# Patient Record
Sex: Female | Born: 1960
Health system: Southern US, Community
[De-identification: ages and names within clinical notes are randomized; demographics above are authoritative.]

## PROBLEM LIST (undated history)

## (undated) DIAGNOSIS — F329 Major depressive disorder, single episode, unspecified: Secondary | ICD-10-CM

## (undated) DIAGNOSIS — D649 Anemia, unspecified: Secondary | ICD-10-CM

## (undated) DIAGNOSIS — G4730 Sleep apnea, unspecified: Secondary | ICD-10-CM

## (undated) DIAGNOSIS — Z9884 Bariatric surgery status: Secondary | ICD-10-CM

## (undated) DIAGNOSIS — D509 Iron deficiency anemia, unspecified: Secondary | ICD-10-CM

## (undated) DIAGNOSIS — M069 Rheumatoid arthritis, unspecified: Secondary | ICD-10-CM

## (undated) DIAGNOSIS — M797 Fibromyalgia: Secondary | ICD-10-CM

## (undated) DIAGNOSIS — I2699 Other pulmonary embolism without acute cor pulmonale: Secondary | ICD-10-CM

## (undated) DIAGNOSIS — Z95828 Presence of other vascular implants and grafts: Secondary | ICD-10-CM

## (undated) DIAGNOSIS — F32A Depression, unspecified: Secondary | ICD-10-CM

## (undated) DIAGNOSIS — E538 Deficiency of other specified B group vitamins: Secondary | ICD-10-CM

## (undated) DIAGNOSIS — Q249 Congenital malformation of heart, unspecified: Secondary | ICD-10-CM

## (undated) DIAGNOSIS — D126 Benign neoplasm of colon, unspecified: Secondary | ICD-10-CM

## (undated) DIAGNOSIS — E119 Type 2 diabetes mellitus without complications: Secondary | ICD-10-CM

## (undated) HISTORY — PX: HERNIA REPAIR: SHX51

## (undated) HISTORY — DX: Depression, unspecified: F32.A

## (undated) HISTORY — DX: Deficiency of other specified B group vitamins: E53.8

## (undated) HISTORY — DX: Benign neoplasm of colon, unspecified: D12.6

## (undated) HISTORY — DX: Major depressive disorder, single episode, unspecified: F32.9

## (undated) HISTORY — PX: CARPAL TUNNEL RELEASE: SHX101

## (undated) HISTORY — DX: Iron deficiency anemia, unspecified: D50.9

## (undated) HISTORY — DX: Other pulmonary embolism without acute cor pulmonale: I26.99

## (undated) HISTORY — PX: TUBAL LIGATION: SHX77

## (undated) HISTORY — DX: Bariatric surgery status: Z98.84

## (undated) HISTORY — DX: Congenital malformation of heart, unspecified: Q24.9

## (undated) HISTORY — DX: Rheumatoid arthritis, unspecified: M06.9

## (undated) HISTORY — DX: Presence of other vascular implants and grafts: Z95.828

## (undated) HISTORY — DX: Sleep apnea, unspecified: G47.30

---

## 2005-10-02 HISTORY — PX: HIATAL HERNIA REPAIR: SHX195

## 2006-10-02 HISTORY — PX: GASTRIC BYPASS: SHX52

## 2007-10-03 HISTORY — PX: CHOLECYSTECTOMY: SHX55

## 2012-07-02 HISTORY — PX: COLONOSCOPY: SHX5424

## 2013-10-02 HISTORY — PX: FOOT SURGERY: SHX648

## 2014-10-16 ENCOUNTER — Telehealth: Payer: Self-pay

## 2014-10-16 NOTE — Telephone Encounter (Signed)
PT was referred from [place] by Denny Levy, PA, for colonoscopy. Hx of polyps and anemia. LMOM for a return call.

## 2014-11-02 NOTE — Telephone Encounter (Signed)
Pt has OV with Walden Field, NP on 11/09/2014.

## 2014-11-09 ENCOUNTER — Other Ambulatory Visit: Payer: Self-pay

## 2014-11-09 ENCOUNTER — Encounter: Payer: Self-pay | Admitting: Nurse Practitioner

## 2014-11-09 ENCOUNTER — Ambulatory Visit (INDEPENDENT_AMBULATORY_CARE_PROVIDER_SITE_OTHER): Payer: BLUE CROSS/BLUE SHIELD | Admitting: Nurse Practitioner

## 2014-11-09 VITALS — BP 133/84 | HR 85 | Temp 97.0°F | Ht 66.5 in | Wt 272.8 lb

## 2014-11-09 DIAGNOSIS — R109 Unspecified abdominal pain: Secondary | ICD-10-CM | POA: Insufficient documentation

## 2014-11-09 DIAGNOSIS — R1030 Lower abdominal pain, unspecified: Secondary | ICD-10-CM

## 2014-11-09 DIAGNOSIS — Z86010 Personal history of colonic polyps: Secondary | ICD-10-CM

## 2014-11-09 DIAGNOSIS — R197 Diarrhea, unspecified: Secondary | ICD-10-CM

## 2014-11-09 LAB — CBC WITH DIFFERENTIAL/PLATELET
BASOS PCT: 1 % (ref 0–1)
Basophils Absolute: 0.1 10*3/uL (ref 0.0–0.1)
Eosinophils Absolute: 0.2 10*3/uL (ref 0.0–0.7)
Eosinophils Relative: 3 % (ref 0–5)
HEMATOCRIT: 41.9 % (ref 36.0–46.0)
HEMOGLOBIN: 13.2 g/dL (ref 12.0–15.0)
LYMPHS PCT: 26 % (ref 12–46)
Lymphs Abs: 2.2 10*3/uL (ref 0.7–4.0)
MCH: 24.8 pg — AB (ref 26.0–34.0)
MCHC: 31.5 g/dL (ref 30.0–36.0)
MCV: 78.6 fL (ref 78.0–100.0)
MONO ABS: 0.7 10*3/uL (ref 0.1–1.0)
MONOS PCT: 9 % (ref 3–12)
NEUTROS ABS: 5.1 10*3/uL (ref 1.7–7.7)
NEUTROS PCT: 61 % (ref 43–77)
Platelets: 241 10*3/uL (ref 150–400)
RBC: 5.33 MIL/uL — AB (ref 3.87–5.11)
RDW: 24.1 % — AB (ref 11.5–15.5)
WBC: 8.3 10*3/uL (ref 4.0–10.5)

## 2014-11-09 LAB — COMPREHENSIVE METABOLIC PANEL
ALK PHOS: 79 U/L (ref 39–117)
ALT: 26 U/L (ref 0–35)
AST: 22 U/L (ref 0–37)
Albumin: 4.1 g/dL (ref 3.5–5.2)
BILIRUBIN TOTAL: 0.7 mg/dL (ref 0.2–1.2)
BUN: 14 mg/dL (ref 6–23)
CHLORIDE: 105 meq/L (ref 96–112)
CO2: 25 meq/L (ref 19–32)
Calcium: 8.3 mg/dL — ABNORMAL LOW (ref 8.4–10.5)
Creat: 0.59 mg/dL (ref 0.50–1.10)
GLUCOSE: 87 mg/dL (ref 70–99)
Potassium: 4.4 mEq/L (ref 3.5–5.3)
Sodium: 138 mEq/L (ref 135–145)
TOTAL PROTEIN: 6.8 g/dL (ref 6.0–8.3)

## 2014-11-09 MED ORDER — PEG-KCL-NACL-NASULF-NA ASC-C 100 G PO SOLR: 1.0000 | Freq: Once | ORAL | Status: AC

## 2014-11-09 NOTE — Patient Instructions (Signed)
1. We are giving you samples of Viberzi to try. Take twice a day for 2 weeks. Call and let us know how this works for your symptoms. 2. We will schedule your procedure (colonosocopy with deep sedation in the OR) with Dr. Gala Romney 3. Have your labs drawn within the next few days. We will call with results.

## 2014-11-09 NOTE — Progress Notes (Signed)
Primary Care Physician:  Denny Levy, [place] Primary Gastroenterologist:  Dr. Gala Romney   Chief Complaint  Patient presents with  . Colonoscopy    HPI:   54 year old female presents on referral from PCP for surveilance colonoscopy. Has a history of frequent polyps. Just relocated to the area less than a year ago. Last TCS in [place] 07/18/12 which found 5 mm sessile transverse colon polyp (tubular adenoma), 6 mm sessile transverse polyp (tubular adenoma), and biopsies r/o microscopic colitis (normal colonic mucosa without evidence of colitis). Prep was fair, recommended repeat in 1 year. Has a history of anemia which she sees heme/onc for and receives iron infusions.  Denies abdominal pain. Has fecal urgency within 30 minutes of eating. Has tried dietary modifications without relief. Has a bowel movement at least daily. Her bowel movements tend to be loose, typically 5-6 on the bristol stool scale. Has not had serology to evaluate for gluten intolerance. Has some abdominal cramping associated which is relieved with a bowel movement. Denies hematochezia and melena. Denies dysphagia. Denies GERD symptoms. She has some bloating if eating too quickly. Takes Naproxen as needed, no other NSAID use or ASA powder use.   Past Medical History  Diagnosis Date  . Presence of IVC filter     Past Surgical History  Procedure Laterality Date  . Hiatal hernia repair  2007  . Cholecystectomy  2009  . Gastric bypass  2008  . Foot surgery  2015    left  . Colonoscopy  07/2012    transverse colon 5 mm polyp removed    Current Outpatient Prescriptions  Medication Sig Dispense Refill  . naproxen (NAPROSYN) 500 MG tablet Take 500 mg by mouth as needed.     No current facility-administered medications for this visit.    Allergies as of 11/09/2014  . (No Known Allergies)    No family history on file.  History   Social History  . Marital Status: Married    Spouse Name: N/A    Number of Children: N/A   . Years of Education: N/A   Occupational History  . Not on file.   Social History Main Topics  . Smoking status: Never Smoker   . Smokeless tobacco: Not on file  . Alcohol Use: No  . Drug Use: No  . Sexual Activity: Not on file   Other Topics Concern  . Not on file   Social History Narrative  . No narrative on file    Review of Systems: Gen: Denies any fever, chills, weight loss, lack of appetite. Admits fatigue consistent with anemia history. CV: Denies chest pain, heart palpitations, peripheral edema, syncope.  Resp: Denies shortness of breath at rest or with exertion. Denies wheezing. Denies difficulty breathing when laying flat. GI: See HPI. Denies dysphagia or odynophagia. GU : Denies urinary burning, urinary frequency, urinary hesitancy MS: Denies joint pain, muscle weakness, cramps, or limitation of movement.  Derm: Denies rash, itching, dry skin Psych: Denies depression, anxiety, memory loss, and confusion Heme: Denies bruising, bleeding, and enlarged lymph nodes.  Physical Exam: BP 133/84 mmHg  Pulse 85  Temp(Src) 97 F (36.1 C) (Oral)  Ht 5' 6.5" (1.689 m)  Wt 272 lb 12.8 oz (123.741 kg)  BMI 43.38 kg/m2 General:   Alert and oriented. Pleasant and cooperative. Well-nourished and well-developed.  Head:  Normocephalic and atraumatic. Eyes:  Without icterus, sclera clear and conjunctiva pink.  Ears:  Normal auditory acuity. Mouth:  No deformity or lesions, oral mucosa  pink. No OP edema. Neck:  Supple, without mass or thyromegaly. Lungs:  Clear to auscultation bilaterally. No wheezes, rales, or rhonchi. No distress.  Heart:  S1, S2 present without murmurs appreciated.  Abdomen:  +BS, soft, non-distended. Mild discomfort to lower abdominal palpation otherwise non-tender. No HSM noted. No guarding or rebound. No masses appreciated.  Rectal:  Deferred  Msk:  Symmetrical without gross deformities. Normal posture. Pulses:  Normal pulses noted. Extremities:   Without clubbing or edema. Neurologic:  Alert and  oriented x4;  grossly normal neurologically. Skin:  Intact without significant lesions or rashes. Cervical Nodes:  No significant cervical adenopathy. Psych:  Alert and cooperative. Normal mood and affect.     11/09/2014 10:16 AM

## 2014-11-10 LAB — TISSUE TRANSGLUTAMINASE, IGA: TISSUE TRANSGLUTAMINASE AB, IGA: 1 U/mL (ref <4)

## 2014-11-10 NOTE — Assessment & Plan Note (Addendum)
Likely IBS-D, dietary modifications ineffective. Less likely gluten intolerance due to no weight loss or other signs of malnutrition. Will check serology to be sure. Is overdue for surveillance colonoscopy based on last recommendation in [place]. Records received and flagged for scanning. Will set her up for a colonoscopy. Will trial Viberzi bid for symptom relief, if ineffective can consider other options/medications. S/P cholecystectomy. Colonoscopy needs to be in the OR with propofol due to history of difficult sedation.  Proceed with TCS in the OR with propofol with Dr. Gala Romney in near future: the risks, benefits, and alternatives have been discussed with the patient in detail. The patient states understanding and desires to proceed.

## 2014-11-10 NOTE — Assessment & Plan Note (Addendum)
Per notes from Jody Stokes (flagged for scanning) has a history of frequent polyps. Last colonoscopy 07/18/12 with fair prep and two tubular adenomatous polyps removed and recommended repeat procedure in 1 year. Is now overdue and will set her up for surveilance TCS in the OR with propofol due to history of difficult sedation. No red flag/warning signs at this time. Having some abdominal cramping which is relieved by frequent soft to loose stools See Abdominal pain A&P for more details.  Proceed with TCS in the OR with propofol with Dr. Gala Romney in near future: the risks, benefits, and alternatives have been discussed with the patient in detail. The patient states understanding and desires to proceed.

## 2014-11-11 ENCOUNTER — Telehealth: Payer: Self-pay | Admitting: Internal Medicine

## 2014-11-11 NOTE — Telephone Encounter (Signed)
Spoke with the pt- she has been taking the viberzi since Monday. She took one Monday night and Tuesday morning and didn't have any problems, she took one Tuesday night and noticed some upper abd cramping that last a few minutes and then went away. She took another one this morning and had severe cramping that lasted about 15 minutes. The pain was just starting to subside when I spoke with her. She said the medication seems to be helping her bm's. She still has to go to the bathroom after eating but it is not urgently. She reports no fever, no nausea, no blood in her stool. Pt is concerned about the cramping and is not sure if she should take any more of the viberzi. Please advise.

## 2014-11-11 NOTE — Telephone Encounter (Signed)
Pt's husband called to say that patient was seen on Monday and she is in a lot of pain (crying). Husband wants to speak with provider. I told him that I had to send a phone note to the nurse and someone would be calling them back with recommendations. [phone]

## 2014-11-17 ENCOUNTER — Encounter (HOSPITAL_COMMUNITY): Payer: Self-pay

## 2014-11-17 ENCOUNTER — Encounter (HOSPITAL_COMMUNITY)
Admission: RE | Admit: 2014-11-17 | Discharge: 2014-11-17 | Disposition: A | Discharge status: Home / Self Care | Payer: BLUE CROSS/BLUE SHIELD | Source: Ambulatory Visit | Attending: Internal Medicine | Admitting: Internal Medicine

## 2014-11-17 HISTORY — DX: Anemia, unspecified: D64.9

## 2014-11-17 NOTE — Telephone Encounter (Signed)
Ask her if she's continuing to take the Viberzi and if she's still having cramping. If she is advise her to stop the Viberzi and see if that helps her cramping symptoms. Abdominal pain is a known side effect with 6-7% of patients. If this is the cause we can try another approach. Make sure she's scheduled for a follow-up so we can re-evaluate. If symptoms become very severe, she should be seen by someone.

## 2014-11-17 NOTE — Pre-Procedure Instructions (Signed)
Patient given information to sign up for my chart at home. 

## 2014-11-18 NOTE — Telephone Encounter (Signed)
Spoke with the pt. She is not taking the viberzi anymore. She said it seemed to help but couldn't stand the pain it was causing. She is still having diarrhea. She is scheduled for her procedure tomorrow and will wait to see what her results are from that.   Stacey, please schedule follow up visit.

## 2014-11-18 NOTE — Telephone Encounter (Signed)
Noted  

## 2014-11-18 NOTE — Telephone Encounter (Signed)
APPOINTMENT MADE AND PATIENT AWARE OF DATE AND TIME  °

## 2014-11-19 ENCOUNTER — Encounter (HOSPITAL_COMMUNITY): Payer: Self-pay | Admitting: *Deleted

## 2014-11-19 ENCOUNTER — Encounter (HOSPITAL_COMMUNITY)
Admission: RE | Disposition: A | Discharge status: Home / Self Care | Payer: Self-pay | Source: Ambulatory Visit | Attending: Internal Medicine

## 2014-11-19 ENCOUNTER — Ambulatory Visit (HOSPITAL_COMMUNITY): Payer: BLUE CROSS/BLUE SHIELD | Admitting: Anesthesiology

## 2014-11-19 ENCOUNTER — Ambulatory Visit (HOSPITAL_COMMUNITY)
Admission: RE | Admit: 2014-11-19 | Discharge: 2014-11-19 | Disposition: A | Discharge status: Home / Self Care | Payer: BLUE CROSS/BLUE SHIELD | Source: Ambulatory Visit | Attending: Internal Medicine | Admitting: Internal Medicine

## 2014-11-19 DIAGNOSIS — R197 Diarrhea, unspecified: Secondary | ICD-10-CM

## 2014-11-19 DIAGNOSIS — Q438 Other specified congenital malformations of intestine: Secondary | ICD-10-CM | POA: Diagnosis not present

## 2014-11-19 DIAGNOSIS — D123 Benign neoplasm of transverse colon: Secondary | ICD-10-CM | POA: Diagnosis not present

## 2014-11-19 DIAGNOSIS — Z860100 Personal history of colon polyps, unspecified: Secondary | ICD-10-CM | POA: Insufficient documentation

## 2014-11-19 DIAGNOSIS — D120 Benign neoplasm of cecum: Secondary | ICD-10-CM | POA: Diagnosis not present

## 2014-11-19 DIAGNOSIS — Z9884 Bariatric surgery status: Secondary | ICD-10-CM | POA: Diagnosis not present

## 2014-11-19 DIAGNOSIS — Z1211 Encounter for screening for malignant neoplasm of colon: Secondary | ICD-10-CM | POA: Diagnosis present

## 2014-11-19 DIAGNOSIS — Z9049 Acquired absence of other specified parts of digestive tract: Secondary | ICD-10-CM | POA: Insufficient documentation

## 2014-11-19 DIAGNOSIS — K5730 Diverticulosis of large intestine without perforation or abscess without bleeding: Secondary | ICD-10-CM | POA: Diagnosis not present

## 2014-11-19 DIAGNOSIS — D124 Benign neoplasm of descending colon: Secondary | ICD-10-CM | POA: Insufficient documentation

## 2014-11-19 HISTORY — PX: POLYPECTOMY: SHX5525

## 2014-11-19 HISTORY — PX: COLONOSCOPY WITH PROPOFOL: SHX5780

## 2014-11-19 HISTORY — PX: BIOPSY: SHX5522

## 2014-11-19 SURGERY — COLONOSCOPY WITH PROPOFOL
Anesthesia: Monitor Anesthesia Care

## 2014-11-19 MED ORDER — GLYCOPYRROLATE 0.2 MG/ML IJ SOLN
INTRAMUSCULAR | Status: AC
  Filled 2014-11-19: qty 1

## 2014-11-19 MED ORDER — FENTANYL CITRATE 0.05 MG/ML IJ SOLN
INTRAMUSCULAR | Status: AC
  Filled 2014-11-19: qty 2

## 2014-11-19 MED ORDER — ONDANSETRON HCL 4 MG/2ML IJ SOLN
4.0000 mg | Freq: Once | INTRAMUSCULAR | Status: AC
  Administered 2014-11-19: 4 mg via INTRAVENOUS

## 2014-11-19 MED ORDER — MIDAZOLAM HCL 2 MG/2ML IJ SOLN
1.0000 mg | INTRAMUSCULAR | Status: DC | PRN
  Administered 2014-11-19: 2 mg via INTRAVENOUS

## 2014-11-19 MED ORDER — LIDOCAINE HCL (CARDIAC) 10 MG/ML IV SOLN
INTRAVENOUS | Status: DC | PRN
  Administered 2014-11-19: 40 mg via INTRAVENOUS

## 2014-11-19 MED ORDER — ONDANSETRON HCL 4 MG/2ML IJ SOLN: 4.0000 mg | Freq: Once | INTRAMUSCULAR | Status: DC | PRN

## 2014-11-19 MED ORDER — GLYCOPYRROLATE 0.2 MG/ML IJ SOLN
0.2000 mg | Freq: Once | INTRAMUSCULAR | Status: AC
  Administered 2014-11-19: 0.2 mg via INTRAVENOUS

## 2014-11-19 MED ORDER — LIDOCAINE HCL (PF) 1 % IJ SOLN
INTRAMUSCULAR | Status: AC
  Filled 2014-11-19: qty 5

## 2014-11-19 MED ORDER — MIDAZOLAM HCL 2 MG/2ML IJ SOLN
INTRAMUSCULAR | Status: AC
  Filled 2014-11-19: qty 2

## 2014-11-19 MED ORDER — STERILE WATER FOR IRRIGATION IR SOLN
Status: DC | PRN
  Administered 2014-11-19: 1000 mL

## 2014-11-19 MED ORDER — PROPOFOL 10 MG/ML IV BOLUS
INTRAVENOUS | Status: AC
  Filled 2014-11-19: qty 20

## 2014-11-19 MED ORDER — PROPOFOL 10 MG/ML IV BOLUS
INTRAVENOUS | Status: DC | PRN
  Administered 2014-11-19: 12.4 mg via INTRAVENOUS

## 2014-11-19 MED ORDER — PROPOFOL INFUSION 10 MG/ML OPTIME
INTRAVENOUS | Status: DC | PRN
  Administered 2014-11-19: 200 ug/kg/min via INTRAVENOUS
  Administered 2014-11-19: 12:00:00 via INTRAVENOUS

## 2014-11-19 MED ORDER — FENTANYL CITRATE 0.05 MG/ML IJ SOLN: 25.0000 ug | INTRAMUSCULAR | Status: DC | PRN

## 2014-11-19 MED ORDER — LACTATED RINGERS IV SOLN
INTRAVENOUS | Status: DC
  Administered 2014-11-19: 11:00:00 via INTRAVENOUS

## 2014-11-19 MED ORDER — FENTANYL CITRATE 0.05 MG/ML IJ SOLN
25.0000 ug | INTRAMUSCULAR | Status: AC
  Administered 2014-11-19 (×2): 25 ug via INTRAVENOUS

## 2014-11-19 MED ORDER — ONDANSETRON HCL 4 MG/2ML IJ SOLN
INTRAMUSCULAR | Status: AC
  Filled 2014-11-19: qty 2

## 2014-11-19 SURGICAL SUPPLY — 23 items
ELECT REM PT RETURN 9FT ADLT (ELECTROSURGICAL)
ELECTRODE REM PT RTRN 9FT ADLT (ELECTROSURGICAL) IMPLANT
FCP BXJMBJMB 240X2.8X (CUTTING FORCEPS)
FLOOR PAD 36X40 (MISCELLANEOUS) ×2
FORCEPS BIOP RAD 4 LRG CAP 4 (CUTTING FORCEPS) ×2 IMPLANT
FORCEPS BIOP RJ4 240 W/NDL (CUTTING FORCEPS)
FORCEPS BXJMBJMB 240X2.8X (CUTTING FORCEPS) IMPLANT
FORMALIN 10 PREFIL 20ML (MISCELLANEOUS) ×10 IMPLANT
INJECTOR/SNARE I SNARE (MISCELLANEOUS) IMPLANT
KIT CLEAN ENDO COMPLIANCE (KITS) ×2 IMPLANT
LUBRICANT JELLY 4.5OZ STERILE (MISCELLANEOUS) ×2 IMPLANT
MANIFOLD NEPTUNE II (INSTRUMENTS) ×2 IMPLANT
NEEDLE SCLEROTHERAPY 25GX240 (NEEDLE) IMPLANT
PAD FLOOR 36X40 (MISCELLANEOUS) ×1 IMPLANT
PROBE APC STR FIRE (PROBE) IMPLANT
PROBE INJECTION GOLD (MISCELLANEOUS)
PROBE INJECTION GOLD 7FR (MISCELLANEOUS) IMPLANT
SNARE ROTATE MED OVAL 20MM (MISCELLANEOUS) ×2 IMPLANT
SNARE SHORT THROW 13M SML OVAL (MISCELLANEOUS) IMPLANT
SYR 50ML LL SCALE MARK (SYRINGE) ×2 IMPLANT
TRAP SPECIMEN MUCOUS 40CC (MISCELLANEOUS) ×2 IMPLANT
TUBING IRRIGATION ENDOGATOR (MISCELLANEOUS) ×2 IMPLANT
WATER STERILE IRR 1000ML POUR (IV SOLUTION) ×2 IMPLANT

## 2014-11-19 NOTE — Anesthesia Postprocedure Evaluation (Signed)
  Anesthesia Post-op Note  Patient: Jody Stokes  Procedure(s) Performed: Procedure(s) with comments: COLONOSCOPY WITH PROPOFOL (N/A) - Cecum time in   1157    time out   1216  total time 19 minutes POLYPECTOMY (N/A) - ileocecal valve, Hepatic Flexure, Splenic Flexure, Descending Colon  BIOPSY (N/A) - Ascending Colon  Patient Location: PACU  Anesthesia Type:MAC  Level of Consciousness: awake, alert , oriented and patient cooperative  Airway and Oxygen Therapy: Patient Spontanous Breathing and Patient connected to nasal cannula oxygen  Post-op Pain: none  Post-op Assessment: Post-op Vital signs reviewed, Patient's Cardiovascular Status Stable, Respiratory Function Stable, Patent Airway, No signs of Nausea or vomiting and Pain level controlled  Post-op Vital Signs: Reviewed and stable  Last Vitals:  Filed Vitals:   11/19/14 1130  BP: 149/92  Pulse: 63  Temp:   Resp: 13    Complications: No apparent anesthesia complications

## 2014-11-19 NOTE — Op Note (Signed)
[facility] [address] [place], [address]   COLONOSCOPY PROCEDURE REPORT  PATIENT: Jody Stokes, Jody Stokes  MR#: [id number] BIRTHDATE: Nov 11, 1960 , 53  yrs. old GENDER: female ENDOSCOPIST: R.  Garfield Cornea, MD FACP [facility] REFERRED AE:SLPNP Quillian Quince, M.D. PROCEDURE DATE:  11/30/14 PROCEDURE:   Ileo-colonoscopy with biopsy and Colonoscopy with snare polypectomy INDICATIONS:History of colonic adenoma; chronic diarrhea. MEDICATIONS: deep sedation per Dr.  [facility] CLASS:       Class II  CONSENT: The risks, benefits, alternatives and imponderables including but not limited to bleeding, perforation as well as the possibility of a missed lesion have been reviewed.  The potential for biopsy, lesion removal, etc. have also been discussed. Questions have been answered.  All parties agreeable.  Please see the history and physical in the medical record for more information.  DESCRIPTION OF PROCEDURE:   After the risks benefits and alternatives of the procedure were thoroughly explained, informed consent was obtained.  The digital rectal exam revealed no abnormalities of the rectum.   The     endoscope was introduced through the anus and advanced to the terminal ileum which was intubated for a short distance. No adverse events experienced. The quality of the prep was inadequate  The instrument was then slowly withdrawn as the colon was fully examined.      COLON FINDINGS: Normal rectum.  Somewhat elongated, redundant colon. Few scattered pancolonic diverticula.  Multiple colonic polyps?"(1) 5 mm polyp at the ileocecal valve, (1) 5 mm polyp at hepatic flexure, (1) 5 mm polyp at the splenic flexure and (1) 5 mm polyp in the mid descending segment.  Otherwise, the remainder of the colonic mucosa appeared normal. The distal 5 cm of terminal ileum mucosa appeared normal.  The above-mentioned polyps were all cold snare removed.  Also, biopsies of the colonic  mucosa taken to assess for microscopic colitis.  Retroflexion was performed. .  Withdrawal time=18 minutes 0 seconds.  The scope was withdrawn and the procedure completed. COMPLICATIONS: There were no immediate complications.  ENDOSCOPIC IMPRESSION: Multiple colonic polyps?"removed as described above. Pancolonic diverticulosis (few).  Status post mucosal biopsy to assess for microscopic colitis  RECOMMENDATIONS: Follow up on pathology. Further recommendations to follow.  eSigned:  R. Garfield Cornea, MD Rosalita Chessman [facility] 11/30/2014 12:55 PM   cc:  CPT CODES: ICD CODES:  The ICD and CPT codes recommended by this software are interpretations from the data that the clinical staff has captured with the software.  The verification of the translation of this report to the ICD and CPT codes and modifiers is the sole responsibility of the health care institution and practicing physician where this report was generated.  [employer] will not be held responsible for the validity of the ICD and CPT codes included on this report.  AMA assumes no liability for data contained or not contained herein. CPT is a Designer, television/film set of the [employer].  PATIENT NAME:  Jody Stokes, Jody Stokes MR#: [id number]

## 2014-11-19 NOTE — Anesthesia Preprocedure Evaluation (Signed)
Anesthesia Evaluation  Patient identified by MRN, date of birth, ID band Patient awake    Reviewed: Allergy & Precautions, NPO status , Patient's Chart, lab work & pertinent test results  Airway Mallampati: II  TM Distance: >3 FB     Dental  (+) Teeth Intact   Pulmonary neg pulmonary ROS,  breath sounds clear to auscultation        Cardiovascular DVT (s/p IVC filter) Rhythm:Regular Rate:Normal     Neuro/Psych    GI/Hepatic   Endo/Other    Renal/GU      Musculoskeletal   Abdominal   Peds  Hematology   Anesthesia Other Findings   Reproductive/Obstetrics                             Anesthesia Physical Anesthesia Plan  ASA: III  Anesthesia Plan: MAC   Post-op Pain Management:    Induction: Intravenous  Airway Management Planned: Simple Face Mask  Additional Equipment:   Intra-op Plan:   Post-operative Plan:   Informed Consent: I have reviewed the patients History and Physical, chart, labs and discussed the procedure including the risks, benefits and alternatives for the proposed anesthesia with the patient or authorized representative who has indicated his/her understanding and acceptance.     Plan Discussed with:   Anesthesia Plan Comments:         Anesthesia Quick Evaluation

## 2014-11-19 NOTE — Discharge Instructions (Signed)
°Colonoscopy °Discharge Instructions ° °Read the instructions outlined below and refer to this sheet in the next few weeks. These discharge instructions provide you with general information on caring for yourself after you leave the hospital. Your doctor may also give you specific instructions. While your treatment has been planned according to the most current medical practices available, unavoidable complications occasionally occur. If you have any problems or questions after discharge, call Dr. Rourk at [phone]. °ACTIVITY °· You may resume your regular activity, but move at a slower pace for the next 24 hours.  °· Take frequent rest periods for the next 24 hours.  °· Walking will help get rid of the air and reduce the bloated feeling in your belly (abdomen).  °· No driving for 24 hours (because of the medicine (anesthesia) used during the test).   °· Do not sign any important legal documents or operate any machinery for 24 hours (because of the anesthesia used during the test).  °NUTRITION °· Drink plenty of fluids.  °· You may resume your normal diet as instructed by your doctor.  °· Begin with a light meal and progress to your normal diet. Heavy or fried foods are harder to digest and may make you feel sick to your stomach (nauseated).  °· Avoid alcoholic beverages for 24 hours or as instructed.  °MEDICATIONS °· You may resume your normal medications unless your doctor tells you otherwise.  °WHAT YOU CAN EXPECT TODAY °· Some feelings of bloating in the abdomen.  °· Passage of more gas than usual.  °· Spotting of blood in your stool or on the toilet paper.  °IF YOU HAD POLYPS REMOVED DURING THE COLONOSCOPY: °· No aspirin products for 7 days or as instructed.  °· No alcohol for 7 days or as instructed.  °· Eat a soft diet for the next 24 hours.  °FINDING OUT THE RESULTS OF YOUR TEST °Not all test results are available during your visit. If your test results are not back during the visit, make an appointment  with your caregiver to find out the results. Do not assume everything is normal if you have not heard from your caregiver or the medical facility. It is important for you to follow up on all of your test results.  °SEEK IMMEDIATE MEDICAL ATTENTION IF: °· You have more than a spotting of blood in your stool.  °· Your belly is swollen (abdominal distention).  °· You are nauseated or vomiting.  °· You have a temperature over 101.  °· You have abdominal pain or discomfort that is severe or gets worse throughout the day.  ° ° °Polyp and diverticulosis information provided ° °Further recommendations to follow pending review of pathology report ° °Diverticulosis °Diverticulosis is the condition that develops when small pouches (diverticula) form in the wall of your colon. Your colon, or large intestine, is where water is absorbed and stool is formed. The pouches form when the inside layer of your colon pushes through weak spots in the outer layers of your colon. °CAUSES  °No one knows exactly what causes diverticulosis. °RISK FACTORS °· Being older than 50. Your risk for this condition increases with age. Diverticulosis is rare in people younger than 40 years. By age 80, almost everyone has it. °· Eating a low-fiber diet. °· Being frequently constipated. °· Being overweight. °· Not getting enough exercise. °· Smoking. °· Taking over-the-counter pain medicines, like aspirin and ibuprofen. °SYMPTOMS  °Most people with diverticulosis do not have symptoms. °DIAGNOSIS  °Because   diverticulosis often has no symptoms, health care providers often discover the condition during an exam for other colon problems. In many cases, a health care provider will diagnose diverticulosis while using a flexible scope to examine the colon (colonoscopy). °TREATMENT  °If you have never developed an infection related to diverticulosis, you may not need treatment. If you have had an infection before, treatment may include: °· Eating more fruits,  vegetables, and grains. °· Taking a fiber supplement. °· Taking a live bacteria supplement (probiotic). °· Taking medicine to relax your colon. °HOME CARE INSTRUCTIONS  °· Drink at least 6-8 glasses of water each day to prevent constipation. °· Try not to strain when you have a bowel movement. °· Keep all follow-up appointments. °If you have had an infection before:  °· Increase the fiber in your diet as directed by your health care provider or dietitian. °· Take a dietary fiber supplement if your health care provider approves. °· Only take medicines as directed by your health care provider. °SEEK MEDICAL CARE IF:  °· You have abdominal pain. °· You have bloating. °· You have cramps. °· You have not gone to the bathroom in 3 days. °SEEK IMMEDIATE MEDICAL CARE IF:  °· Your pain gets worse. °· Your bloating becomes very bad. °· You have a fever or chills, and your symptoms suddenly get worse. °· You begin vomiting. °· You have bowel movements that are bloody or black. °MAKE SURE YOU: °· Understand these instructions. °· Will watch your condition. °· Will get help right away if you are not doing well or get worse. °Document Released: 06/15/2004 Document Revised: 09/23/2013 Document Reviewed: 08/13/2013 °ExitCare® Patient Information ©2015 [employer]. This information is not intended to replace advice given to you by your health care provider. Make sure you discuss any questions you have with your health care provider. ° ° °Colon Polyps °Polyps are lumps of extra tissue growing inside the body. Polyps can grow in the large intestine (colon). Most colon polyps are noncancerous (benign). However, some colon polyps can become cancerous over time. Polyps that are larger than a pea may be harmful. To be safe, caregivers remove and test all polyps. °CAUSES  °Polyps form when mutations in the genes cause your cells to grow and divide even though no more tissue is needed. °RISK FACTORS °There are a number of risk factors  that can increase your chances of getting colon polyps. They include: °· Being older than 50 years. °· Family history of colon polyps or colon cancer. °· Long-term colon diseases, such as colitis or Crohn disease. °· Being overweight. °· Smoking. °· Being inactive. °· Drinking too much alcohol. °SYMPTOMS  °Most small polyps do not cause symptoms. If symptoms are present, they may include: °· Blood in the stool. The stool may look dark red or black. °· Constipation or diarrhea that lasts longer than 1 week. °DIAGNOSIS °People often do not know they have polyps until their caregiver finds them during a regular checkup. Your caregiver can use 4 tests to check for polyps: °· Digital rectal exam. The caregiver wears gloves and feels inside the rectum. This test would find polyps only in the rectum. °· Barium enema. The caregiver puts a liquid called barium into your rectum before taking X-rays of your colon. Barium makes your colon look white. Polyps are dark, so they are easy to see in the X-ray pictures. °· Sigmoidoscopy. A thin, flexible tube (sigmoidoscope) is placed into your rectum. The sigmoidoscope has a light and tiny camera   in it. The caregiver uses the sigmoidoscope to look at the last third of your colon. °· Colonoscopy. This test is like sigmoidoscopy, but the caregiver looks at the entire colon. This is the most common method for finding and removing polyps. °TREATMENT  °Any polyps will be removed during a sigmoidoscopy or colonoscopy. The polyps are then tested for cancer. °PREVENTION  °To help lower your risk of getting more colon polyps: °· Eat plenty of fruits and vegetables. Avoid eating fatty foods. °· Do not smoke. °· Avoid drinking alcohol. °· Exercise every day. °· Lose weight if recommended by your caregiver. °· Eat plenty of calcium and folate. Foods that are rich in calcium include milk, cheese, and broccoli. Foods that are rich in folate include chickpeas, kidney beans, and spinach. °HOME CARE  INSTRUCTIONS °Keep all follow-up appointments as directed by your caregiver. You may need periodic exams to check for polyps. °SEEK MEDICAL CARE IF: °You notice bleeding during a bowel movement. °Document Released: 06/14/2004 Document Revised: 12/11/2011 Document Reviewed: 11/28/2011 °ExitCare® Patient Information ©2015 [employer]. This information is not intended to replace advice given to you by your health care provider. Make sure you discuss any questions you have with your health care provider. ° °

## 2014-11-19 NOTE — Transfer of Care (Signed)
Immediate Anesthesia Transfer of Care Note  Patient: Jody Stokes  Procedure(s) Performed: Procedure(s) with comments: COLONOSCOPY WITH PROPOFOL (N/A) - Cecum time in   1157    time out   1216  total time 19 minutes POLYPECTOMY (N/A) - ileocecal valve, Hepatic Flexure, Splenic Flexure, Descending Colon  BIOPSY (N/A) - Ascending Colon  Patient Location: PACU  Anesthesia Type:MAC  Level of Consciousness: awake, alert , oriented and patient cooperative  Airway & Oxygen Therapy: Patient Spontanous Breathing and Patient connected to nasal cannula oxygen  Post-op Assessment: Report given to RN and Post -op Vital signs reviewed and stable  Post vital signs: Reviewed and stable  Last Vitals:  Filed Vitals:   11/19/14 1130  BP: 149/92  Pulse: 63  Temp:   Resp: 13    Complications: No apparent anesthesia complications

## 2014-11-19 NOTE — Interval H&P Note (Signed)
History and Physical Interval Note:  11/19/2014 11:38 AM  Jody Stokes  has presented today for surgery, with the diagnosis of history of colon polyps  The various methods of treatment have been discussed with the patient and family. After consideration of risks, benefits and other options for treatment, the patient has consented to  Procedure(s) with comments: COLONOSCOPY WITH PROPOFOL (N/A) - 1215 - moved to 11:15 - Ginger to notify pt of arrival time as a surgical intervention .  The patient's history has been reviewed, patient examined, no change in status, stable for surgery.  I have reviewed the patient's chart and labs.  Questions were answered to the patient's satisfaction.     Jody Stokes  Viberzi caused cramps and she stopped taking it after 2 days. Otherwise, no change. Colonoscopy per plan.  The risks, benefits, limitations, alternatives and imponderables have been reviewed with the patient. Questions have been answered. All parties are agreeable.

## 2014-11-19 NOTE — Anesthesia Procedure Notes (Signed)
Procedure Name: MAC Date/Time: 11/19/2014 11:42 AM Performed by: Vista Deck Pre-anesthesia Checklist: Patient identified, Emergency Drugs available, Suction available, Timeout performed and Patient being monitored Patient Re-evaluated:Patient Re-evaluated prior to inductionOxygen Delivery Method: Non-rebreather mask

## 2014-11-19 NOTE — H&P (View-Only) (Signed)
Primary Care Physician:  Denny Levy, [place] Primary Gastroenterologist:  Dr. Gala Romney   Chief Complaint  Patient presents with  . Colonoscopy    HPI:   54 year old female presents on referral from PCP for surveilance colonoscopy. Has a history of frequent polyps. Just relocated to the area less than a year ago. Last TCS in [place] 07/18/12 which found 5 mm sessile transverse colon polyp (tubular adenoma), 6 mm sessile transverse polyp (tubular adenoma), and biopsies r/o microscopic colitis (normal colonic mucosa without evidence of colitis). Prep was fair, recommended repeat in 1 year. Has a history of anemia which she sees heme/onc for and receives iron infusions.  Denies abdominal pain. Has fecal urgency within 30 minutes of eating. Has tried dietary modifications without relief. Has a bowel movement at least daily. Her bowel movements tend to be loose, typically 5-6 on the bristol stool scale. Has not had serology to evaluate for gluten intolerance. Has some abdominal cramping associated which is relieved with a bowel movement. Denies hematochezia and melena. Denies dysphagia. Denies GERD symptoms. She has some bloating if eating too quickly. Takes Naproxen as needed, no other NSAID use or ASA powder use.   Past Medical History  Diagnosis Date  . Presence of IVC filter     Past Surgical History  Procedure Laterality Date  . Hiatal hernia repair  2007  . Cholecystectomy  2009  . Gastric bypass  2008  . Foot surgery  2015    left  . Colonoscopy  07/2012    transverse colon 5 mm polyp removed    Current Outpatient Prescriptions  Medication Sig Dispense Refill  . naproxen (NAPROSYN) 500 MG tablet Take 500 mg by mouth as needed.     No current facility-administered medications for this visit.    Allergies as of 11/09/2014  . (No Known Allergies)    No family history on file.  History   Social History  . Marital Status: Married    Spouse Name: N/A    Number of Children: N/A   . Years of Education: N/A   Occupational History  . Not on file.   Social History Main Topics  . Smoking status: Never Smoker   . Smokeless tobacco: Not on file  . Alcohol Use: No  . Drug Use: No  . Sexual Activity: Not on file   Other Topics Concern  . Not on file   Social History Narrative  . No narrative on file    Review of Systems: Gen: Denies any fever, chills, weight loss, lack of appetite. Admits fatigue consistent with anemia history. CV: Denies chest pain, heart palpitations, peripheral edema, syncope.  Resp: Denies shortness of breath at rest or with exertion. Denies wheezing. Denies difficulty breathing when laying flat. GI: See HPI. Denies dysphagia or odynophagia. GU : Denies urinary burning, urinary frequency, urinary hesitancy MS: Denies joint pain, muscle weakness, cramps, or limitation of movement.  Derm: Denies rash, itching, dry skin Psych: Denies depression, anxiety, memory loss, and confusion Heme: Denies bruising, bleeding, and enlarged lymph nodes.  Physical Exam: BP 133/84 mmHg  Pulse 85  Temp(Src) 97 F (36.1 C) (Oral)  Ht 5' 6.5" (1.689 m)  Wt 272 lb 12.8 oz (123.741 kg)  BMI 43.38 kg/m2 General:   Alert and oriented. Pleasant and cooperative. Well-nourished and well-developed.  Head:  Normocephalic and atraumatic. Eyes:  Without icterus, sclera clear and conjunctiva pink.  Ears:  Normal auditory acuity. Mouth:  No deformity or lesions, oral mucosa  pink. No OP edema. Neck:  Supple, without mass or thyromegaly. Lungs:  Clear to auscultation bilaterally. No wheezes, rales, or rhonchi. No distress.  Heart:  S1, S2 present without murmurs appreciated.  Abdomen:  +BS, soft, non-distended. Mild discomfort to lower abdominal palpation otherwise non-tender. No HSM noted. No guarding or rebound. No masses appreciated.  Rectal:  Deferred  Msk:  Symmetrical without gross deformities. Normal posture. Pulses:  Normal pulses noted. Extremities:   Without clubbing or edema. Neurologic:  Alert and  oriented x4;  grossly normal neurologically. Skin:  Intact without significant lesions or rashes. Cervical Nodes:  No significant cervical adenopathy. Psych:  Alert and cooperative. Normal mood and affect.     11/09/2014 10:16 AM

## 2014-11-20 ENCOUNTER — Encounter (HOSPITAL_COMMUNITY): Payer: Self-pay | Admitting: Internal Medicine

## 2014-11-21 NOTE — Progress Notes (Signed)
CC'ED TO PCP 

## 2014-11-25 ENCOUNTER — Encounter: Payer: Self-pay | Admitting: Internal Medicine

## 2014-11-25 ENCOUNTER — Telehealth: Payer: Self-pay

## 2014-11-25 MED ORDER — DICYCLOMINE HCL 10 MG PO CAPS: 10.0000 mg | ORAL_CAPSULE | Freq: Three times a day (TID) | ORAL | Status: DC

## 2014-11-25 NOTE — Telephone Encounter (Signed)
Addressed in the letter and staff message

## 2014-11-25 NOTE — Telephone Encounter (Signed)
Pt had procedures done last week. She wanted to know if RMR found anything that could be contributing to her cramping and diarrhea. She said she is still having problems with it.

## 2014-11-25 NOTE — Telephone Encounter (Signed)
Per RMR- Send letter to patient.  Send copy of letter with path to referring provider and PCP.   Let's try Bentyl 10 mg 3 times a day when necessary diarrhea. Dispense 60 with one refill.   Office visit with Randall Hiss in about a month from now

## 2014-11-25 NOTE — Telephone Encounter (Signed)
Pt is aware. rx sent to the pharmacy. She has an appt with EG on 12/18/14 and she is aware of date and time.

## 2014-12-18 ENCOUNTER — Ambulatory Visit: Payer: BLUE CROSS/BLUE SHIELD | Admitting: Nurse Practitioner

## 2014-12-18 ENCOUNTER — Encounter: Payer: Self-pay | Admitting: Nurse Practitioner

## 2014-12-18 ENCOUNTER — Telehealth: Payer: Self-pay | Admitting: Internal Medicine

## 2014-12-18 NOTE — Telephone Encounter (Signed)
PATIENT WAS A NO SHOW 12/18/14 AND LETTER WAS SENT

## 2014-12-18 NOTE — Telephone Encounter (Signed)
Noted  

## 2015-03-23 DIAGNOSIS — R5383 Other fatigue: Secondary | ICD-10-CM | POA: Insufficient documentation

## 2015-04-27 ENCOUNTER — Ambulatory Visit (INDEPENDENT_AMBULATORY_CARE_PROVIDER_SITE_OTHER): Payer: BLUE CROSS/BLUE SHIELD | Admitting: Podiatry

## 2015-04-27 ENCOUNTER — Encounter: Payer: Self-pay | Admitting: Podiatry

## 2015-04-27 VITALS — BP 133/84 | HR 82 | Resp 16 | Ht 66.5 in | Wt 270.0 lb

## 2015-04-27 DIAGNOSIS — M7730 Calcaneal spur, unspecified foot: Secondary | ICD-10-CM | POA: Diagnosis not present

## 2015-04-27 DIAGNOSIS — M722 Plantar fascial fibromatosis: Secondary | ICD-10-CM

## 2015-04-27 NOTE — Progress Notes (Signed)
Subjective:     Patient ID: Jody Stokes, female   DOB: 1961-05-16, 54 y.o.   MRN: [id number]  HPI 54 year old female presents the office they with concerns of bilateral heel pain as well as heel spurs. She states that she has had pain to the bottoms of her feet for several years. She presents on other podiatrist who performed surgery on the left side which appears to be a tarsal tunnel release. She states that after that surgery she did get relief for some burning in numbness that she had to her left foot although she does continue of pain to the bottom of her heel. She states that she has pain about her heel on the morning and she first gets up after sitting on her feet for periods of time. She no longer has any numbness or tingling to her feet. She denies any swelling or redness. She said that she has had multiple braces, injections as well as stretching and icing has had multiple treatments that any relief over the last several years. At this time she is requesting surgical intervention.  Review of Systems  All other systems reviewed and are negative.      Objective:   Physical Exam AAO x3, NAD DP/PT pulses palpable bilaterally, CRT less than 3 seconds Protective sensation intact with Simms Weinstein monofilament, vibratory sensation intact, Achilles tendon reflex intact; negative tinel sign bilaterally There is a scar over the posterior aspect of the medial malleolus on left side from prior surgery which is well-healed. There does bear be some scar tissue overlying this area which seems to cause some discomfort however there is no specific tenderness overlying this area at this time. There is tender to palpation along the plantar medial tubercle bilateral heels of the insertion of the plantar fascia. There is mild discomfort along the course of the plantar fascia along the medial band within the arch of the foot. The plantar fascia appears to be intact bilaterally. There is no pain on the  posterior aspect of the calcaneus on the course last insertion of the Achilles tendon. There is no pain with lateral compression of the calcaneus. There is no other areas of tenderness to bilateral lower extremities. There is no amount edema, erythema, increase in warmth. There is a decrease in medial arch height upon weightbearing. MMT 5/5, ROM WNL.  No open lesions or pre-ulcerative lesions. .  No pain with calf compression, swelling, warmth, erythema bilaterally.      Assessment:     55 year old female with apparent continued heel pain, likely plantar fasciitis    Plan:     -X-rays from Dr. Jenne Campus office were reviewed. -Treatment options discussed including all alternatives, risks, and complications. I discussed both conservative and surgical treatment options -At this time she went to proceed with surgical intervention. However she was recently diagnosed with sleep apnea yesterday. I discussed that she has had clearance prior to having surgery. Also given her history of pulmonary embolism and her other medical conditions she is at high risk for surgery complications. She is to have medical clearance prior to any surgery. -If she does pursue surgery I discussed with her heel spur resection as well as plantar fasciotomy. I discussed that help with this will help alleviate the most of her pain to her foot although this is not a guarantee and she still may have pain. Given her foot type she would likely need to still have orthotics after surgery. -Discussed MRI however she wishes to hold off. -Follow-up  after medical clearance or sooner if any palms are to arise. In the meantime I encouraged her to call the office with any questions, concerns, change in symptoms.  Celesta Gentile, DPM

## 2015-05-13 ENCOUNTER — Other Ambulatory Visit: Payer: Self-pay

## 2015-05-14 MED ORDER — DICYCLOMINE HCL 10 MG PO CAPS: 10.0000 mg | ORAL_CAPSULE | Freq: Three times a day (TID) | ORAL | Status: DC

## 2015-05-20 ENCOUNTER — Other Ambulatory Visit: Payer: Self-pay | Admitting: Internal Medicine

## 2015-05-21 ENCOUNTER — Telehealth: Payer: Self-pay | Admitting: *Deleted

## 2015-05-21 NOTE — Telephone Encounter (Addendum)
I returned Brittney's call.  She wanted to know what Dr. Jacqualyn Posey needed for surgery.  I'm returning your call.  Dr. Jacqualyn Posey needs a letter stating that patient is okay to have surgery under a local / IV sedation.  Are there any concerns that he should be aware of? "Okay, I'll take care of it."

## 2015-06-28 ENCOUNTER — Telehealth: Payer: Self-pay | Admitting: *Deleted

## 2015-06-28 NOTE — Telephone Encounter (Signed)
"  I was calling to verify and see if Dr. Delena Bali had give medical authorization so Dr.  Jacqualyn Posey can go ahead and do foot surgery."

## 2015-06-29 ENCOUNTER — Telehealth: Payer: Self-pay | Admitting: *Deleted

## 2015-06-29 NOTE — Telephone Encounter (Signed)
I'm returning your call.  From reading Dr. Leigh Aurora note, he wanted you to get medical clearance for your sleep apnea.  "Yes, that is correct.  I went and saw the doctor about 2-3 weeks ago and he said he would fax it to Dr. Jacqualyn Posey.  I gave him Dr. Leigh Aurora business card.  He said he would fax it to him."  We have not received anything.  Can you get him to fax it to the [place] location?  "I'll call now and see what's going on."

## 2015-06-29 NOTE — Telephone Encounter (Signed)
Pt called to see if medical clearance letter had been sent from her sleep apnea doctor. I told her it had, and read it to her and gave to [facility].  Pt states have Delydia call her to set up the surgery, I informed Delydia.

## 2015-07-06 NOTE — Telephone Encounter (Signed)
I called and left patient a message to call and schedule a consultation with Dr. Jacqualyn Posey for the [place] office.  We received medical clearance from Dr. Quentin Mulling.

## 2015-07-13 ENCOUNTER — Encounter: Payer: Self-pay | Admitting: Podiatry

## 2015-07-13 ENCOUNTER — Ambulatory Visit (INDEPENDENT_AMBULATORY_CARE_PROVIDER_SITE_OTHER): Payer: BLUE CROSS/BLUE SHIELD | Admitting: Podiatry

## 2015-07-13 VITALS — BP 118/69 | HR 76 | Resp 18

## 2015-07-13 DIAGNOSIS — M7730 Calcaneal spur, unspecified foot: Secondary | ICD-10-CM

## 2015-07-13 DIAGNOSIS — M722 Plantar fascial fibromatosis: Secondary | ICD-10-CM | POA: Diagnosis not present

## 2015-07-13 NOTE — Patient Instructions (Signed)
Pre-Operative Instructions  Congratulations, you have decided to take an important step to improving your quality of life.  You can be assured that the doctors of [facility] will be with you every step of the way.  1. Plan to be at the surgery center/hospital at least 1 (one) hour prior to your scheduled time unless otherwise directed by the surgical center/hospital staff.  You must have a responsible adult accompany you, remain during the surgery and drive you home.  Make sure you have directions to the surgical center/hospital and know how to get there on time. 2. For hospital based surgery you will need to obtain a history and physical form from your family physician within 1 month prior to the date of surgery- we will give you a form for you primary physician.  3. We make every effort to accommodate the date you request for surgery.  There are however, times where surgery dates or times have to be moved.  We will contact you as soon as possible if a change in schedule is required.   4. No Aspirin/Ibuprofen for one week before surgery.  If you are on aspirin, any non-steroidal anti-inflammatory medications (Mobic, Aleve, Ibuprofen) you should stop taking it 7 days prior to your surgery.  You make take Tylenol  For pain prior to surgery.  5. Medications- If you are taking daily heart and blood pressure medications, seizure, reflux, allergy, asthma, anxiety, pain or diabetes medications, make sure the surgery center/hospital is aware before the day of surgery so they may notify you which medications to take or avoid the day of surgery. 6. No food or drink after midnight the night before surgery unless directed otherwise by surgical center/hospital staff. 7. No alcoholic beverages 24 hours prior to surgery.  No smoking 24 hours prior to or 24 hours after surgery. 8. Wear loose pants or shorts- loose enough to fit over bandages, boots, and casts. 9. No slip on shoes, sneakers are best. 10. Bring  your boot with you to the surgery center/hospital.  Also bring crutches or a walker if your physician has prescribed it for you.  If you do not have this equipment, it will be provided for you after surgery. 11. If you have not been contracted by the surgery center/hospital by the day before your surgery, call to confirm the date and time of your surgery. 12. Leave-time from work may vary depending on the type of surgery you have.  Appropriate arrangements should be made prior to surgery with your employer. 13. Prescriptions will be provided immediately following surgery by your doctor.  Have these filled as soon as possible after surgery and take the medication as directed. 14. Remove nail polish on the operative foot. 15. Wash the night before surgery.  The night before surgery wash the foot and leg well with the antibacterial soap provided and water paying special attention to beneath the toenails and in between the toes.  Rinse thoroughly with water and dry well with a towel.  Perform this wash unless told not to do so by your physician.  Enclosed: 1 Ice pack (please put in freezer the night before surgery)   1 Hibiclens skin cleaner   Pre-op Instructions  If you have any questions regarding the instructions, do not hesitate to call our office.  [place]: [address] [place], [place] [address] [phone]  [place]: [address] [place], [place] [address] [phone]  [place]: [address]  [place], [place] [address] [phone]  Dr. Richard   Tuchman DPM, Dr. Norman Regal DPM Dr. Richard Sikora DPM, Dr. M. Todd Hyatt DPM, Dr. Kathryn Egerton DPM, Dr. Matthew Wagoner DPM 

## 2015-07-16 ENCOUNTER — Telehealth: Payer: Self-pay | Admitting: *Deleted

## 2015-07-16 NOTE — Telephone Encounter (Signed)
"  I was calling to see if you have scheduled my foot surgery on my left foot.  I was told to call you a few days after my appointment with Dr. Jacqualyn Posey.  Give me a call and let me know if you got that date yet."  I'm returning your call.  Dr. Jacqualyn Posey can do your surgery on 08/04/2015.  "Okay, that date is fine."  Surgical center will call you with the arrival time a day or 2 prior to the surgery date.  You will need to register with the surgical center.  Information on how to do that on-line is in the surgical brochure or you can call them.  "Okay, I'll go ahead and take care of that."

## 2015-07-17 NOTE — Progress Notes (Signed)
BP 118/69 mmHg  Pulse 76  Resp 18   Subjective:    Patient ID: Jody Stokes, female    DOB: Mar 27, 1961, 54 y.o.   MRN: [id number]  HPI: Jody Stokes is a 54 y.o. female presenting on 07/13/2015 for follow-up evaluation of bilateral heel pain and discuss surgery for heel pain/spur. She states the left thigh contusion be worse than the right side. Since last appointment she has obtain medical clearance to proceed with surgery. She continues to have pain to her heel particularly the morning or after standing for long periods time. She is attended multiple conservative treatments without any relief of symptoms at this time is requesting surgical interventions. She previously is tried shoe gear modifications, orthotics, medications, stretching. She previous he did have a tarsal tunnel release the left side which relieved with tingling and numbness that she had all that she continues to have a throbbing pain in the bottom of her heel. No new complaints this time in no acute changes.  Relevant past medical, surgical, family and social history reviewed and updated as indicated. Interim medical history since our last visit reviewed. Allergies and medications reviewed and updated.  Current Outpatient Prescriptions on File Prior to Visit  Medication Sig  . cholecalciferol (VITAMIN D) 1000 UNITS tablet Take 1,000 Units by mouth daily.  . Cyanocobalamin (VITAMIN B-12 IJ) Inject as directed.  . dicyclomine (BENTYL) 10 MG capsule take 1 capsule by mouth three times a day before meals if needed   No current facility-administered medications on file prior to visit.    Review of systems:  Per HPI unless specifically indicated above     Objective:    BP 118/69 mmHg  Pulse 76  Resp 18  Wt Readings from Last 3 Encounters:  04/27/15 270 lb (122.471 kg)  11/19/14 272 lb (123.378 kg)  11/17/14 272 lb (123.378 kg)    Physical Exam General: AAO x3, NAD  Dermatological: Skin is warm, dry and  supple bilateral. Nails x 10 are well manicured; remaining integument appears unremarkable at this time. There are no open sores, no preulcerative lesions, no rash or signs of infection present. A scars present on the medial ankle the left side from prior surgery which is well-healed  Vascular: Dorsalis Pedis artery and Posterior Tibial artery pedal pulses are 2/4 bilateral with immedate capillary fill time. Pedal hair growth present. No varicosities and no lower extremity edema present bilateral. There is no pain with calf compression, swelling, warmth, erythema.   Neruologic: Grossly intact via light touch bilateral. Vibratory intact via tuning fork bilateral. Protective threshold with Semmes Wienstein monofilament intact to all pedal sites bilateral. Patellar and Achilles deep tendon reflexes 2+ bilateral. No Babinski or clonus noted bilateral.   Musculoskeletal: There is continued tenderness to palpation around the plantar medial tubercle of the calcaneus at the insertion of the plantar fascial bilaterally with the left side worse in the right. There is no pain on the course of plantar fascial bilaterally and the plantar fascia appears to be intact. There is no pain on the course/insertion of the Achilles tendon and Thompson's test is negative. There is no pain lateral compression of the calcaneus. There is no other areas of pinpoint bony tenderness or pain the vibratory sensation to bilateral lower extremities. MMT 5/5, ROM WNL.    Results for orders placed or performed in visit on 11/09/14  CBC w/Diff  Result Value Ref Range   WBC 8.3 4.0 - 10.5 K/uL   RBC 5.33 (H)  3.87 - 5.11 MIL/uL   Hemoglobin 13.2 12.0 - 15.0 g/dL   HCT 41.9 36.0 - 46.0 %   MCV 78.6 78.0 - 100.0 fL   MCH 24.8 (L) 26.0 - 34.0 pg   MCHC 31.5 30.0 - 36.0 g/dL   RDW 24.1 (H) 11.5 - 15.5 %   Platelets 241 150 - 400 K/uL   MPV Not Performed 8.6 - 12.4 fL   Neutrophils Relative % 61 43 - 77 %   Neutro Abs 5.1 1.7 - 7.7 K/uL    Lymphocytes Relative 26 12 - 46 %   Lymphs Abs 2.2 0.7 - 4.0 K/uL   Monocytes Relative 9 3 - 12 %   Monocytes Absolute 0.7 0.1 - 1.0 K/uL   Eosinophils Relative 3 0 - 5 %   Eosinophils Absolute 0.2 0.0 - 0.7 K/uL   Basophils Relative 1 0 - 1 %   Basophils Absolute 0.1 0.0 - 0.1 K/uL   Smear Review SEE NOTE   Comp Met (CMET)  Result Value Ref Range   Sodium 138 135 - 145 mEq/L   Potassium 4.4 3.5 - 5.3 mEq/L   Chloride 105 96 - 112 mEq/L   CO2 25 19 - 32 mEq/L   Glucose, Bld 87 70 - 99 mg/dL   BUN 14 6 - 23 mg/dL   Creat 0.59 0.50 - 1.10 mg/dL   Total Bilirubin 0.7 0.2 - 1.2 mg/dL   Alkaline Phosphatase 79 39 - 117 U/L   AST 22 0 - 37 U/L   ALT 26 0 - 35 U/L   Total Protein 6.8 6.0 - 8.3 g/dL   Albumin 4.1 3.5 - 5.2 g/dL   Calcium 8.3 (L) 8.4 - 10.5 mg/dL  Tissue transglutaminase, IgA  Result Value Ref Range   Tissue Transglutaminase Ab, IgA 1 <4 U/mL      Assessment & Plan:  54 y.o. female presents for surgical consultation of left foot heel pain likely is a result of plantar fasciitis -Treatment options discussed including all alternatives, risks, and complications. I discussed both conservative and surgical treatment options.  -I discussed with her conservative options including physical therapy and EPAT however she does not wish to pursue this at this time and she is requesting surgical intervention. -I discussed with her left foot endoscopic plantar fascial release and possible heel spur resection -The incision placement as well as the postoperative course was discussed with the patient. I discussed risks of the surgery which include, but not limited to, infection, bleeding, pain, swelling, need for further surgery, delayed or nonhealing, painful or ugly scar, numbness or sensation changes, over/under correction, recurrence, transfer lesions, further deformity, hardware failure, DVT/PE, loss of toe/foot. Patient understands these risks and wishes to proceed with surgery. The  surgical consent was reviewed with the patient all 3 pages were signed. No promises or guarantees were given to the outcome of the procedure. All questions were answered to the best of my ability. Before the surgery the patient was encouraged to call the office if there is any further questions. The surgery will be performed at the [facility] on an outpatient basis. -Follow-up after surery or sooner if any problems arise. In the meantime, encouraged to call the office with any questions, concerns, change in symptoms.   Celesta Gentile, DPM

## 2015-07-26 ENCOUNTER — Telehealth: Payer: Self-pay | Admitting: Internal Medicine

## 2015-07-26 NOTE — Telephone Encounter (Signed)
Medication is bentyl. This was refilled in August. Routing to the refill box

## 2015-07-26 NOTE — Telephone Encounter (Signed)
Pt is going out of town and is running out of her baclofen. She has no refills and I told her to call her pharmacy to send [place] a refill request. She said that she has been taking 4 times a day because the original directions aren't helping her and wants to know if we could increase her baclofen to 4 times a day. Please call her back at [phone] and advise.

## 2015-07-27 MED ORDER — DICYCLOMINE HCL 10 MG PO CAPS: ORAL_CAPSULE | ORAL | Status: DC

## 2015-07-27 NOTE — Addendum Note (Signed)
Addended by: Orvil Feil on: 07/27/2015 12:19 PM   Modules accepted: Orders

## 2015-07-27 NOTE — Telephone Encounter (Signed)
Completed.

## 2015-08-04 ENCOUNTER — Encounter: Payer: Self-pay | Admitting: Podiatry

## 2015-08-04 DIAGNOSIS — M722 Plantar fascial fibromatosis: Secondary | ICD-10-CM | POA: Diagnosis not present

## 2015-08-04 DIAGNOSIS — M7730 Calcaneal spur, unspecified foot: Secondary | ICD-10-CM | POA: Diagnosis not present

## 2015-08-05 ENCOUNTER — Telehealth: Payer: Self-pay | Admitting: *Deleted

## 2015-08-05 NOTE — Telephone Encounter (Signed)
Pt states she surgery yesterday, and the generic Percocet is not helping the throbbing pain she is having.  Instructed pt to remove the boot and the open-ended sock, then remove the ace wrap only and elevate the surgical foot for 15 mins.  Then pt should put foot level and rewrap the ace looser, replace the sock, and boot, then not be on the foot more than 5 min/hr.  I told pt if she could tolerate Ibuprofen OTC she could take that in between the doses of the Percocet, and to call with concerns.  Pt states understanding.

## 2015-08-12 ENCOUNTER — Ambulatory Visit (INDEPENDENT_AMBULATORY_CARE_PROVIDER_SITE_OTHER): Payer: BLUE CROSS/BLUE SHIELD | Admitting: Podiatry

## 2015-08-12 ENCOUNTER — Encounter: Payer: Self-pay | Admitting: Podiatry

## 2015-08-12 ENCOUNTER — Ambulatory Visit (INDEPENDENT_AMBULATORY_CARE_PROVIDER_SITE_OTHER): Payer: BLUE CROSS/BLUE SHIELD

## 2015-08-12 VITALS — BP 163/96 | HR 80 | Resp 18

## 2015-08-12 DIAGNOSIS — M7730 Calcaneal spur, unspecified foot: Secondary | ICD-10-CM

## 2015-08-12 DIAGNOSIS — Z9889 Other specified postprocedural states: Secondary | ICD-10-CM

## 2015-08-12 DIAGNOSIS — M722 Plantar fascial fibromatosis: Secondary | ICD-10-CM | POA: Diagnosis not present

## 2015-08-12 MED ORDER — OXYCODONE-ACETAMINOPHEN 5-325 MG PO TABS: 1.0000 | ORAL_TABLET | Freq: Four times a day (QID) | ORAL | Status: DC | PRN

## 2015-08-12 NOTE — Progress Notes (Signed)
Patient ID: Jody Stokes, female   DOB: [birth year], 55 y.o.   MRN: [id number]  Subjective: Jody Stokes is a 53 y.o. is seen today in office s/p left open plantar fasciotomy with heel spur resection preformed on 08/04/15. They state their pain is improving since surgery. She does get some stinging pain to the heel but she feels it is improved.  She's been taking the antibiotics as directed and she is also been taking the pain medication as needed. Denies any systemic complaints such as fevers, chills, nausea, vomiting. No calf pain, chest pain, shortness of breath.   Objective: General: No acute distress, AAOx3  DP/PT pulses palpable 2/4, CRT < 3 sec to all digits.  Protective sensation intact. Motor function intact.  Left foot: Incision is well coapted without any evidence of dehiscence and sutures are intact. There is no surrounding erythema, ascending cellulitis, fluctuance, crepitus, malodor, drainage/purulence. There is mild edema around the surgical site. There is some pain along the surgical site but mostly to the plantar heel what there is mild edema.  No other areas of tenderness to bilateral lower extremities.  No other open lesions or pre-ulcerative lesions.  No pain with calf compression, swelling, warmth, erythema.   Assessment and Plan:  Status post left foot surgery, doing well with no complications   -Treatment options discussed including all alternatives, risks, and complications -X-rays were obtained and reviewed with the patient.  -Ice/elevation -Continue with CAM boot at all times. -Discussed knee scooter but she declined -Pain medication as needed. -Monitor for any clinical signs or symptoms of infection and DVT/PE and directed to call the office immediately should any occur or go to the ER. -Follow-up in 1 week for possible suture removal or sooner if any problems arise. In the meantime, encouraged to call the office with any questions, concerns, change in symptoms.    Celesta Gentile, DPM

## 2015-08-19 ENCOUNTER — Ambulatory Visit: Payer: BLUE CROSS/BLUE SHIELD | Admitting: Podiatry

## 2015-08-19 ENCOUNTER — Encounter: Payer: Self-pay | Admitting: Podiatry

## 2015-08-24 ENCOUNTER — Ambulatory Visit (INDEPENDENT_AMBULATORY_CARE_PROVIDER_SITE_OTHER): Payer: BLUE CROSS/BLUE SHIELD | Admitting: Podiatry

## 2015-08-24 ENCOUNTER — Encounter: Payer: Self-pay | Admitting: Podiatry

## 2015-08-24 VITALS — BP 162/101 | HR 69 | Resp 18

## 2015-08-24 DIAGNOSIS — M722 Plantar fascial fibromatosis: Secondary | ICD-10-CM

## 2015-08-24 DIAGNOSIS — M7730 Calcaneal spur, unspecified foot: Secondary | ICD-10-CM

## 2015-08-24 DIAGNOSIS — Z9889 Other specified postprocedural states: Secondary | ICD-10-CM

## 2015-08-24 MED ORDER — OXYCODONE-ACETAMINOPHEN 5-325 MG PO TABS: 1.0000 | ORAL_TABLET | Freq: Four times a day (QID) | ORAL | Status: DC | PRN

## 2015-08-24 NOTE — Patient Instructions (Signed)
Pre-Operative Instructions  Congratulations, you have decided to take an important step to improving your quality of life.  You can be assured that the doctors of [facility] will be with you every step of the way.  1. Plan to be at the surgery center/hospital at least 1 (one) hour prior to your scheduled time unless otherwise directed by the surgical center/hospital staff.  You must have a responsible adult accompany you, remain during the surgery and drive you home.  Make sure you have directions to the surgical center/hospital and know how to get there on time. 2. For hospital based surgery you will need to obtain a history and physical form from your family physician within 1 month prior to the date of surgery- we will give you a form for you primary physician.  3. We make every effort to accommodate the date you request for surgery.  There are however, times where surgery dates or times have to be moved.  We will contact you as soon as possible if a change in schedule is required.   4. No Aspirin/Ibuprofen for one week before surgery.  If you are on aspirin, any non-steroidal anti-inflammatory medications (Mobic, Aleve, Ibuprofen) you should stop taking it 7 days prior to your surgery.  You make take Tylenol  For pain prior to surgery.  5. Medications- If you are taking daily heart and blood pressure medications, seizure, reflux, allergy, asthma, anxiety, pain or diabetes medications, make sure the surgery center/hospital is aware before the day of surgery so they may notify you which medications to take or avoid the day of surgery. 6. No food or drink after midnight the night before surgery unless directed otherwise by surgical center/hospital staff. 7. No alcoholic beverages 24 hours prior to surgery.  No smoking 24 hours prior to or 24 hours after surgery. 8. Wear loose pants or shorts- loose enough to fit over bandages, boots, and casts. 9. No slip on shoes, sneakers are best. 10. Bring  your boot with you to the surgery center/hospital.  Also bring crutches or a walker if your physician has prescribed it for you.  If you do not have this equipment, it will be provided for you after surgery. 11. If you have not been contracted by the surgery center/hospital by the day before your surgery, call to confirm the date and time of your surgery. 12. Leave-time from work may vary depending on the type of surgery you have.  Appropriate arrangements should be made prior to surgery with your employer. 13. Prescriptions will be provided immediately following surgery by your doctor.  Have these filled as soon as possible after surgery and take the medication as directed. 14. Remove nail polish on the operative foot. 15. Wash the night before surgery.  The night before surgery wash the foot and leg well with the antibacterial soap provided and water paying special attention to beneath the toenails and in between the toes.  Rinse thoroughly with water and dry well with a towel.  Perform this wash unless told not to do so by your physician.  Enclosed: 1 Ice pack (please put in freezer the night before surgery)   1 Hibiclens skin cleaner   Pre-op Instructions  If you have any questions regarding the instructions, do not hesitate to call our office.  [place]: [address] [place], [place] [address] [phone]  [place]: [address] [place], [place] [address] [phone]  [place]: [address]  [place], [place] [address] [phone]  Dr. Richard   Tuchman DPM, Dr. Norman Regal DPM Dr. Richard Sikora DPM, Dr. M. Todd Hyatt DPM, Dr. Kathryn Egerton DPM 

## 2015-08-29 NOTE — Progress Notes (Signed)
Patient ID: Jody Stokes, female   DOB: [birth year], 54 y.o.   MRN: [id number]  Subjective: Jody Stokes is a 54 y.o. is seen today in office s/p Left heel spur resection and plantar fasciotomy preformed on 08/04/15. They state their pain is much improved since last appointment.  She has continued with a cam boot and weightbearing as tolerated. She states the patient across the bottom of the heel has resolved compared with was prior to surgery. The majority of her pain is along the incision site. She states the swelling is also decreased. She also states that the right side continues to be bothersome and is ready to go ahead and discuss surgical invention on the right side. She's had the same pain in the right-sided she does the left side she is attended multiple conservative treatments including, but not limited to, shoe gear modifications, offloading and padding. At this time she is requesting surgical intervention to help decrease her pain.Denies any systemic complaints such as fevers, chills, nausea, vomiting. No calf pain, chest pain, shortness of breath.   Objective: General: No acute distress, AAOx3  DP/PT pulses palpable 2/4, CRT < 3 sec to all digits.  Protective sensation intact. Motor function intact.  Left foot: Incision is well coapted without any evidence of dehiscence and sutures are intact. There is no surrounding erythema, ascending cellulitis, fluctuance, crepitus, malodor, drainage/purulence. There is decreased edema around the surgical site. There is mild pain along the surgical site.  Right foot: there is tenderness palpation over the plantar medial tubercle of the calcaneus at the insertion the plantar fascia. There is no pain along the course of plantar fascial in the arch of the foot and the plantar fascia appears to be intact. There is no pain with lateral compression of the calcaneus or pain the vibratory sensation. There is no pain with coarse/insertion of the Achilles tendon.  There is mild equinus present. No other areas of tenderness to bilateral lower extremities. No other areas of tenderness to bilateral lower extremities.  No other open lesions or pre-ulcerative lesions.  No pain with calf compression, swelling, warmth, erythema.   Assessment and Plan:  Status post left foot surgery, doing well with no complications; continued right heel pain/plantar fasciitis/heel spur  -Treatment options discussed including all alternatives, risks, and complications - on the left side today sutures are removed without complications. Antibiotic ointment is placed over the incision followed by dry sterile dressing. She can  Remove the bandage tomorrow and start to shower. Weightbearing as tolerated in a cam boot. Continue sleep the cam boot at night (she states she has not been doing this).  Start stretching exercises. She can start to transition to regular shoe as tolerated as pain subsides. Hold off on high-impact activities or exercising for least 8 weeks after surgery. - On the right-sided discussed with her plantar fasciotomy and heel spur resection given her continued pain. Sugar proceed with surgery at this time. The incision placement as well as the postoperative course was discussed with the patient. I discussed risks of the surgery which include, but not limited to, infection, bleeding, pain, swelling, need for further surgery, delayed or nonhealing, painful or ugly scar, numbness or sensation changes, over/under correction, recurrence, transfer lesions, further deformity, hardware failure, DVT/PE, loss of toe/foot. Patient understands these risks and wishes to proceed with surgery. The surgical consent was reviewed with the patient all 3 pages were signed. No promises or guarantees were given to the outcome of the procedure. All questions were  answered to the best of my ability. Before the surgery the patient was encouraged to call the office if there is any further questions.  The surgery will be performed at the [facility] on an outpatient basis. -Ice/elevation -Pain medication as needed. -Monitor for any clinical signs or symptoms of infection and DVT/PE and directed to call the office immediately should any occur or go to the ER. -Follow-up in 3 weeks or sooner if any problems arise. In the meantime, encouraged to call the office with any questions, concerns, change in symptoms.   Celesta Gentile, DPM

## 2015-09-02 NOTE — Progress Notes (Signed)
Patient ID: Jody Stokes, female   DOB: [birth year], 54 y.o.   MRN: [id number] Dr Jacqualyn Posey performed a left foot open plantar fascial release, heel spur removal on 08/04/2015 at [facility]

## 2015-09-06 ENCOUNTER — Telehealth: Payer: Self-pay | Admitting: *Deleted

## 2015-09-06 NOTE — Telephone Encounter (Addendum)
"  I was told that someone would call me to set up my surgery.  It's been a week and I haven't heard anything."  When would you like to schedule surgery? "I'd like to do it after the 13th, because that's when I see him again so he can release me for the other foot that I had surgery on."  He can do it December 21 or 28.  "Let's do it on the 21st."   Okay, the surgical center will call you a day or two prior to with the arrival time.  Do not eat or drink anything after midnight the night before.  "Please have them contact me on my cell phone.  Sometime my house phone doesn't work right and I don't get the message."

## 2015-09-14 ENCOUNTER — Telehealth: Payer: Self-pay | Admitting: *Deleted

## 2015-09-14 ENCOUNTER — Ambulatory Visit (INDEPENDENT_AMBULATORY_CARE_PROVIDER_SITE_OTHER): Payer: BLUE CROSS/BLUE SHIELD

## 2015-09-14 ENCOUNTER — Ambulatory Visit (INDEPENDENT_AMBULATORY_CARE_PROVIDER_SITE_OTHER): Payer: BLUE CROSS/BLUE SHIELD | Admitting: Podiatry

## 2015-09-14 DIAGNOSIS — M7730 Calcaneal spur, unspecified foot: Secondary | ICD-10-CM

## 2015-09-14 DIAGNOSIS — M722 Plantar fascial fibromatosis: Secondary | ICD-10-CM

## 2015-09-14 DIAGNOSIS — Z9889 Other specified postprocedural states: Secondary | ICD-10-CM

## 2015-09-14 MED ORDER — MELOXICAM 15 MG PO TABS: 15.0000 mg | ORAL_TABLET | Freq: Every day | ORAL | Status: DC

## 2015-09-14 NOTE — Patient Instructions (Signed)

## 2015-09-14 NOTE — Telephone Encounter (Signed)
Pt states she left without her exercise instructions and her medication was not called to the pharmacy.  I informed pt I would mail the instructions and have Dr. Jacqualyn Posey order the medication to be picked up tomorrow.

## 2015-09-14 NOTE — Telephone Encounter (Signed)
I sent mobic to her pharmacy.

## 2015-09-15 DIAGNOSIS — M722 Plantar fascial fibromatosis: Secondary | ICD-10-CM | POA: Insufficient documentation

## 2015-09-15 NOTE — Progress Notes (Signed)
Patient ID: Jody Stokes, female   DOB: 1961-01-21, 54 y.o.   MRN: [id number]  Subjective: Jody Stokes is a 54 y.o. is seen today in office s/p Left heel spur resection and plantar fasciotomy preformed on 08/04/15. They state their pain is continued to improv since last appointment.  She has transitioned to wearing regular shoe. She does occasionally get Semmes discomfort still but is improving. She does not sleep in a CAM boot with a night splint.  She is scheduled to have surgery to her right foot next couple weeks and she is to let to proceed with this and that time. No other complaints at this time. Denies any systemic complaints such as fevers, chills, nausea, vomiting. No calf pain, chest pain, shortness of breath.   Objective: General: No acute distress, AAOx3  DP/PT pulses palpable 2/4, CRT < 3 sec to all digits.  Protective sensation intact. Motor function intact.  Left foot: Incision is well coapted without any evidence of dehiscence and scar has foremd. There does appear to be some tenderness on the medial incision however does appear to be decreased. There is also mild tenderness to palpation on the plantar aspect of the heel although again this is decreasing. There is localized edema overlying the medial incision without any erythema or increase in warmth. There is no drainage or purulence. No swelling erythema, ascending cellulitis, flexion was, crepitus, malodor, drainage/purulence.  Right foot: there is continued tenderness to palpation over the plantar medial tubercle of the calcaneus at the insertion the plantar fascia. There is no pain along the course of plantar fascial in the arch of the foot and the plantar fascia appears to be intact. There is no pain with lateral compression of the calcaneus or pain the vibratory sensation. There is no pain with coarse/insertion of the Achilles tendon. There is mild equinus present. No other areas of tenderness to bilateral lower extremities. No  other areas of tenderness to bilateral lower extremities.  No other open lesions or pre-ulcerative lesions.  No pain with calf compression, swelling, warmth, erythema.   Assessment and Plan:  Status post left foot surgery, doing well with no complications; continued right heel pain/plantar fasciitis/heel spur  -Treatment options discussed including all alternatives, risks, and complications -On the left side recommended continue with sleeping the night splint or cam boot at night. Hold off on high-impact activities or exercising. Ice to the area. Also stretching exercises daily. Prescribed voltaren.  -ON the right she is scheduled for upcoming surgery. No further questions. -Follow-up after surgery. -Monitor for any clinical signs or symptoms of infection and directed to call the office immediately should any occur or go to the ER.  Jody Stokes, DPM

## 2015-09-22 ENCOUNTER — Encounter: Payer: Self-pay | Admitting: Podiatry

## 2015-09-22 DIAGNOSIS — M722 Plantar fascial fibromatosis: Secondary | ICD-10-CM | POA: Diagnosis not present

## 2015-09-22 DIAGNOSIS — M7730 Calcaneal spur, unspecified foot: Secondary | ICD-10-CM | POA: Diagnosis not present

## 2015-09-30 ENCOUNTER — Telehealth: Payer: Self-pay | Admitting: *Deleted

## 2015-09-30 ENCOUNTER — Ambulatory Visit (INDEPENDENT_AMBULATORY_CARE_PROVIDER_SITE_OTHER): Payer: BLUE CROSS/BLUE SHIELD | Admitting: Podiatry

## 2015-09-30 ENCOUNTER — Encounter: Payer: Self-pay | Admitting: Podiatry

## 2015-09-30 ENCOUNTER — Ambulatory Visit (INDEPENDENT_AMBULATORY_CARE_PROVIDER_SITE_OTHER): Payer: BLUE CROSS/BLUE SHIELD

## 2015-09-30 VITALS — BP 153/85 | HR 81 | Resp 18

## 2015-09-30 DIAGNOSIS — Z9889 Other specified postprocedural states: Secondary | ICD-10-CM

## 2015-09-30 DIAGNOSIS — M722 Plantar fascial fibromatosis: Secondary | ICD-10-CM

## 2015-09-30 MED ORDER — OXYCODONE-ACETAMINOPHEN 5-325 MG PO TABS: 1.0000 | ORAL_TABLET | ORAL | Status: DC | PRN

## 2015-09-30 NOTE — Telephone Encounter (Signed)
CALLED PATIENT TO SEE HOW PATIENT WAS DOING ON 09/23/15 AND PATIENT STATED THAT SHE WAS DOING FINE AND THAT WE WOULD SEE PATIENT IN THE OFFICE ON 09/30/15. Jaimin Krupka

## 2015-10-04 DIAGNOSIS — M722 Plantar fascial fibromatosis: Secondary | ICD-10-CM | POA: Insufficient documentation

## 2015-10-04 NOTE — Progress Notes (Signed)
Patient ID: Jody Stokes, female   DOB: 1961/03/14, 55 y.o.   MRN: [id number]  Subjective: Jody Stokes is a 54 y.o. is seen today in office s/p right EPF, heel spur resection. They state their pain is getting somewhat better, although she does that she is having pain in the bottom of her heel, surgical site. She's been taking the antibiotics as directed. She has been continuing the CAM boot. Pain medication does help the pain. Denies any systemic complaints such as fevers, chills, nausea, vomiting. No calf pain, chest pain, shortness of breath.   Objective: General: No acute distress, AAOx3  DP/PT pulses palpable 2/4, CRT < 3 sec to all digits.  Protective sensation intact. Motor function intact.  Right foot: Incision is well coapted without any evidence of dehiscence with sutures intact3. There is no surrounding erythema, ascending cellulitis, fluctuance, crepitus, malodor, drainage/purulence. There is mild edema around the surgical site. There is  pain along the surgical site.  No other areas of tenderness to bilateral lower extremities.  No other open lesions or pre-ulcerative lesions.  No pain with calf compression, swelling, warmth, erythema.   Assessment and Plan:  Status post Right EPF and heel spur resection, postop pain   -X-rays were obtained and reviewed with the patient.  -Treatment options discussed including all alternatives, risks, and complications -Ice/elevation -Pain medication as needed. -Continue cam boot at all times, even at night. -Monitor for any clinical signs or symptoms of infection and DVT/PE and directed to call the office immediately should any occur or go to the ER. -Follow-up in 1 week for possible suture removal or sooner if any problems arise. In the meantime, encouraged to call the office with any questions, concerns, change in symptoms.   Celesta Gentile, DPM

## 2015-10-05 ENCOUNTER — Ambulatory Visit (INDEPENDENT_AMBULATORY_CARE_PROVIDER_SITE_OTHER): Payer: BLUE CROSS/BLUE SHIELD | Admitting: Podiatry

## 2015-10-05 ENCOUNTER — Encounter: Payer: Self-pay | Admitting: Podiatry

## 2015-10-05 VITALS — BP 145/94 | HR 81 | Resp 18

## 2015-10-05 DIAGNOSIS — Z9889 Other specified postprocedural states: Secondary | ICD-10-CM

## 2015-10-05 DIAGNOSIS — M722 Plantar fascial fibromatosis: Secondary | ICD-10-CM

## 2015-10-05 NOTE — Patient Instructions (Signed)

## 2015-10-05 NOTE — Progress Notes (Signed)
Patient ID: Jody Stokes, female   DOB: [birth year], 55 y.o.   MRN: [id number]  Subjective: Jody Stokes is a 55 y.o. is seen today in office s/p right EPF, heel spur resection. He states that she is improving to the pain of the right side. She feels that the pain is different is more surgeries as the pinch is having. She states the pain on the left side his bowels him as well as prior to surgery. She has continue the cam boot on the right side although she has not been sleeping the last 2 nights. Denies any systemic complaints such as fevers, chills, nausea, vomiting. No calf pain, chest pain, shortness of breath.   Objective: General: No acute distress, AAOx3  DP/PT pulses palpable 2/4, CRT < 3 sec to all digits.  Protective sensation intact. Motor function intact.  Right foot: Incision is well coapted without any evidence of dehiscence with sutures intact. There is no surrounding erythema, ascending cellulitis, fluctuance, crepitus, malodor, drainage/purulence. There is mild edema around the surgical site. There is  pain along the surgical site mostly along the medial incision and plantar heel. On the right foot there continues to be tenderness on the plantar medial tubercle of the calcaneus at insertion of plantar fascia.  Lateral compression. No other areas of tenderness to bilateral lower extremities.  No other open lesions or pre-ulcerative lesions.  No pain with calf compression, swelling, warmth, erythema.   Assessment and Plan:  Status post Right EPF and heel spur resection -Treatment options discussed including all alternatives, risks, and complications sutures removed. -Antibiotic ointment placed followed by dressing. Continue with the bandages tomorrow and start to shower. -Ice/elevation -Pain medication as needed. -Continue cam boot at all times, even at night. -Monitor for any clinical signs or symptoms of infection and DVT/PE and directed to call the office immediately should  any occur or go to the ER. -Follow-up in 2 weeks or sooner if any problems arise. In the meantime, encouraged to call the office with any questions, concerns, change in symptoms.  *possible medrol dosepack if symptoms continue next appt  Jody Stokes, DPM

## 2015-10-07 ENCOUNTER — Encounter: Payer: Self-pay | Admitting: Podiatry

## 2015-10-19 ENCOUNTER — Encounter: Payer: Self-pay | Admitting: Podiatry

## 2015-10-19 ENCOUNTER — Ambulatory Visit (INDEPENDENT_AMBULATORY_CARE_PROVIDER_SITE_OTHER): Payer: BLUE CROSS/BLUE SHIELD | Admitting: Podiatry

## 2015-10-19 VITALS — BP 115/69 | HR 89 | Resp 18

## 2015-10-19 DIAGNOSIS — Z9889 Other specified postprocedural states: Secondary | ICD-10-CM

## 2015-10-19 DIAGNOSIS — M7730 Calcaneal spur, unspecified foot: Secondary | ICD-10-CM

## 2015-10-19 DIAGNOSIS — M722 Plantar fascial fibromatosis: Secondary | ICD-10-CM

## 2015-10-19 MED ORDER — METHYLPREDNISOLONE 4 MG PO TBPK: ORAL_TABLET | ORAL | Status: DC

## 2015-10-19 MED ORDER — PROMETHAZINE HCL 25 MG PO TABS: 25.0000 mg | ORAL_TABLET | Freq: Three times a day (TID) | ORAL | Status: DC | PRN

## 2015-10-19 MED ORDER — OXYCODONE-ACETAMINOPHEN 5-325 MG PO TABS: 1.0000 | ORAL_TABLET | ORAL | Status: DC | PRN

## 2015-10-24 NOTE — Progress Notes (Signed)
Patient ID: Jody Stokes, female   DOB: 1961/04/11, 55 y.o.   MRN: [id number]  Subjective: Jody Stokes is a 54 y.o. is seen today in office s/p right EPF, heel spur resection. She previously had heel spur resection of the left side. She does continue pain to her left side although the pain to the right side does appear to be improving. She is back to wearing a regular shoe. She is trying to wear the night splint at night although she does not do it consistently. She does continue a stretching and icing exercises intermittently as well. Denies any systemic complaints such as fevers, chills, nausea, vomiting. No calf pain, chest pain, shortness of breath.   Objective: General: No acute distress, AAOx3  DP/PT pulses palpable 2/4, CRT < 3 sec to all digits.  Protective sensation intact. Motor function intact.  Right foot: Incision is well coapted without any evidence of dehiscence and scar has formed. There is no surrounding erythema, ascending cellulitis, fluctuance, crepitus, malodor, drainage/purulence. No clinical signs of infection. There is mild edema around the surgical site but improved. They're discontinue be tenderness of the bottom of the heel although somewhat improved but her last appointment. On the right foot there continues to be tenderness on the plantar medial tubercle of the calcaneus at insertion of plantar fascia, and remained about the same compared to last appointment. There is no pain with lateral compression of the calcaneus bilaterally. No other areas of tenderness to bilateral lower extremities.  No other open lesions or pre-ulcerative lesions.  No pain with calf compression, swelling, warmth, erythema.   Assessment and Plan:  Status post Right EPF and heel spur resection; previous left heel surgery with continued pain -Treatment options discussed including all alternatives, risks, and complications sutures removed. -Prescribed Medrol Dosepak. Once this was complete can  restart anti-inflammatories. -Cocoa butter vitamin E cream over the incisions daily.  -Ice/elevation -Pain medication as needed. -Continue with regular shoe gear as tolerated. If his increase in pain returned the CAM boot. Continue night splint or a cam boot at night every night. -Monitor for any clinical signs or symptoms of infection and DVT/PE and directed to call the office immediately should any occur or go to the ER. -Follow-up as scheduled or sooner if any problems arise. In the meantime, encouraged to call the office with any questions, concerns, change in symptoms.   Celesta Gentile, DPM

## 2015-10-26 ENCOUNTER — Telehealth: Payer: Self-pay | Admitting: *Deleted

## 2015-10-26 DIAGNOSIS — R609 Edema, unspecified: Secondary | ICD-10-CM

## 2015-10-26 NOTE — Telephone Encounter (Signed)
Pt states she has been waiting for information concerning ordering a 2nd pair of orthotic and if insurance will cover.

## 2015-10-28 NOTE — Telephone Encounter (Addendum)
-----   Message from Trula Slade, DPM sent at 10/27/2015  6:26 PM EST ----- Val- can you order venous reflux study ----- Message -----    From: Viviana Simpler, PMAC    Sent: 10/27/2015   4:11 PM      To: Trula Slade, DPM  DR Jacqualyn Posey, PATIENT HAS A DEDUCTIBLE THIS YEAR AND STATES SHE WOULD LIKE TO HOLD OFF ON THE INSERTS AND ALSO STATES THAT SHE WAS TO HAVE A VASCULAR STUDY DONE. LISA 10/28/2015 - orders faxed to [facility].

## 2015-11-11 ENCOUNTER — Ambulatory Visit (INDEPENDENT_AMBULATORY_CARE_PROVIDER_SITE_OTHER): Payer: BLUE CROSS/BLUE SHIELD | Admitting: Podiatry

## 2015-11-11 ENCOUNTER — Other Ambulatory Visit: Payer: Self-pay | Admitting: Podiatry

## 2015-11-11 ENCOUNTER — Encounter: Payer: Self-pay | Admitting: Podiatry

## 2015-11-11 ENCOUNTER — Ambulatory Visit (INDEPENDENT_AMBULATORY_CARE_PROVIDER_SITE_OTHER): Payer: BLUE CROSS/BLUE SHIELD

## 2015-11-11 DIAGNOSIS — M779 Enthesopathy, unspecified: Secondary | ICD-10-CM | POA: Diagnosis not present

## 2015-11-11 DIAGNOSIS — Z9889 Other specified postprocedural states: Secondary | ICD-10-CM | POA: Diagnosis not present

## 2015-11-11 DIAGNOSIS — M722 Plantar fascial fibromatosis: Secondary | ICD-10-CM | POA: Diagnosis not present

## 2015-11-11 MED ORDER — OXYCODONE-ACETAMINOPHEN 5-325 MG PO TABS: 1.0000 | ORAL_TABLET | ORAL | Status: DC | PRN

## 2015-11-11 MED ORDER — MELOXICAM 15 MG PO TABS
15.0000 mg | ORAL_TABLET | Freq: Every day | ORAL | Status: DC
Start: 2015-11-11 — End: 2016-04-13

## 2015-11-11 NOTE — Patient Instructions (Signed)

## 2015-11-11 NOTE — Progress Notes (Signed)
Patient ID: [name], female   DOB: [birth year], 55 y.o.   MRN: [id number]  Subjective: Jody Stokes is a 55 y.o. is seen today in office s/p right EPF, heel spur resection. She previously had heel spur resection of the left side. She feels that there is a knot inside the heel on her right foot on the surgical site. She states that she has pain appointment she first gets up but when she walks the pain does resolve. She has not been sleeping in the night splint wear the CAM boot. She has been wearing a regular shoe.  She has not been doing stretching or icing exercises. Denies any systemic complaints such as fevers, chills, nausea, vomiting. No calf pain, chest pain, shortness of breath.   Objective: General: No acute distress, AAOx3  DP/PT pulses palpable 2/4, CRT < 3 sec to all digits.  Protective sensation intact. Motor function intact.  Right foot: Incision is well coapted without any evidence of dehiscence and scar has formed. There is no surrounding erythema, ascending cellulitis, fluctuance, crepitus, malodor, drainage/purulence. No clinical signs of infection. There is mild edema around the surgical site but improved. There is tenderness to palpation along the  Medial aspect of the plantar fascia just distal to the calcaneal tubercle. There is no pain within the arch of the foot. No pain with medial to lateral compression of the calcaneus. No pain with vibratory sensation. No other areas of tenderness to bilateral lower extremities.  No other open lesions or pre-ulcerative lesions.  No pain with calf compression, swelling, warmth, erythema.   Assessment and Plan:  Status post Right EPF and heel spur resection  With continued pain. -Treatment options discussed including all alternatives, risks, and complications sutures removed. -X-rays were obtained and reviewed with the patient.  No definitive evidence of acute fracture or stress fracture. -Patient elects to proceed with steroid  injection into the left heel. Under sterile skin preparation, a total of 2.5cc of kenalog 10, 0.5% Marcaine plain, and 2% lidocaine plain were infiltrated into the symptomatic area without complication. A band-aid was applied. Patient tolerated the injection well without complication. Post-injection care with discussed with the patient. Discussed with the patient to ice the area over the next couple of days to help prevent a steroid flare.  -Plantar fascial taping was applied. Continue orthotics. I do believe that should benefit from more supportive orthotic however she would like to hold off on that now due to cost. -Stretching and icing exercises daily. -Continue with night splints her cam boot at night every night. -Continue with regular shoe gear as tolerated. If his increase in pain returned the CAM boot. Continue night splint or a cam boot at night every night. -Monitor for any clinical signs or symptoms of infection and DVT/PE and directed to call the office immediately should any occur or go to the ER. -Follow-up as scheduled or sooner if any problems arise. In the meantime, encouraged to call the office with any questions, concerns, change in symptoms.   Celesta Gentile, DPM

## 2015-11-15 ENCOUNTER — Encounter: Payer: Self-pay | Admitting: Podiatry

## 2015-11-16 ENCOUNTER — Encounter: Payer: BLUE CROSS/BLUE SHIELD | Admitting: Podiatry

## 2015-12-09 ENCOUNTER — Ambulatory Visit (INDEPENDENT_AMBULATORY_CARE_PROVIDER_SITE_OTHER): Payer: BLUE CROSS/BLUE SHIELD | Admitting: Podiatry

## 2015-12-09 ENCOUNTER — Encounter: Payer: Self-pay | Admitting: Podiatry

## 2015-12-09 VITALS — BP 140/84 | HR 74 | Resp 18

## 2015-12-09 DIAGNOSIS — M722 Plantar fascial fibromatosis: Secondary | ICD-10-CM | POA: Diagnosis not present

## 2015-12-09 NOTE — Progress Notes (Signed)
Patient ID: [name], female   DOB: 11-08-1960, 55 y.o.   MRN: [id number]  Subjective: Jody Stokes is a 55 y.o. is seen today in office s/p right EPF, heel spur resection. She states her left side is doing better and she is having much pain. She says on the right side she gets some pain in the morning which she gets up that she gets going and she is not having any pain Or standing or walking for a couple hours as she starts to get pain. She feels that she is making improvement on the right side and the injection did help last appointment. She has started to increase her activities, she is on a fitness class yesterday. No recent injury or trauma. No increase in swelling or redness. No tingling or numbness. No other complaints at this time.  Objective: General: No acute distress, AAOx3  DP/PT pulses palpable 2/4, CRT < 3 sec to all digits.  Protective sensation intact. Motor function intact.  Right foot: Incision is well coapted without any evidence of dehiscence and scar has formed. There is no surrounding erythema, ascending cellulitis, fluctuance, crepitus, malodor, drainage/purulence. No clinical signs of infection. There is slight edema to the surgical site however does continue to improve. There is improved, but continued tenderness to palpation along the medial aspect of the plantar fascia just distal to the calcaneal tubercle. There is no pain within the arch of the foot. No pain with medial to lateral compression of the calcaneus. No pain with vibratory sensation. No other areas of tenderness to bilateral lower extremities.  No other open lesions or pre-ulcerative lesions.  No pain with calf compression, swelling, warmth, erythema.   Assessment and Plan:  Status post Right EPF and heel spur resection, with some improvement.   -Treatment options discussed including all alternatives, risks, and complications sutures removed. -Patient elects to proceed with steroid injection into the  right heel. Under sterile skin preparation, a total of 2.5cc of kenalog 10, 0.5% Marcaine plain, and 2% lidocaine plain were infiltrated into the symptomatic area without complication. A band-aid was applied. Patient tolerated the injection well without complication. Post-injection care with discussed with the patient. Discussed with the patient to ice the area over the next couple of days to help prevent a steroid flare.  -Plantar fascial taping was applied.  -I do believe she would  benefit more from orthotics. She did purchase power steps today. She wishes to hold off on CMO. Break in instructions were discussed. -Continue stretching and icing exercises daily. -Hold off on high-impact activities for now. -Follow-up in 4-6 weeks or sooner any issues are to arise. Call any questions or concerns in the meantime.  Celesta Gentile, DPM   *on the last note it said steroid injection was given to the left heel, but it was the right.

## 2016-01-07 DIAGNOSIS — E538 Deficiency of other specified B group vitamins: Secondary | ICD-10-CM | POA: Diagnosis not present

## 2016-01-07 DIAGNOSIS — R531 Weakness: Secondary | ICD-10-CM | POA: Diagnosis not present

## 2016-01-07 DIAGNOSIS — D509 Iron deficiency anemia, unspecified: Secondary | ICD-10-CM | POA: Diagnosis not present

## 2016-01-20 ENCOUNTER — Encounter: Payer: BLUE CROSS/BLUE SHIELD | Admitting: Podiatry

## 2016-01-27 ENCOUNTER — Encounter: Payer: Self-pay | Admitting: Podiatry

## 2016-01-27 ENCOUNTER — Ambulatory Visit (INDEPENDENT_AMBULATORY_CARE_PROVIDER_SITE_OTHER): Payer: BLUE CROSS/BLUE SHIELD | Admitting: Podiatry

## 2016-01-27 DIAGNOSIS — M722 Plantar fascial fibromatosis: Secondary | ICD-10-CM

## 2016-01-27 MED ORDER — HYDROCODONE-ACETAMINOPHEN 5-325 MG PO TABS: 1.0000 | ORAL_TABLET | Freq: Four times a day (QID) | ORAL | Status: DC | PRN

## 2016-01-27 MED ORDER — PROMETHAZINE HCL 25 MG PO TABS: 25.0000 mg | ORAL_TABLET | Freq: Three times a day (TID) | ORAL | Status: DC | PRN

## 2016-01-28 ENCOUNTER — Telehealth: Payer: Self-pay | Admitting: *Deleted

## 2016-01-28 NOTE — Telephone Encounter (Signed)
Pt states Dr. Jacqualyn Posey refilled the pain medication and was to refill the nausea medication, but the pharmacy states it is not there.  I reviewed pt's medication orders and there was a confirmation for the refill on 01/27/2016 at 422pm with the [facility] on [address].

## 2016-01-31 NOTE — Progress Notes (Signed)
Patient ID: Jody Stokes, female   DOB: [birth year], 55 y.o.   MRN: [id number]  Subjective: 55 year old female presents the office today for continuation of bilateral heel pain. She states the inside part of her heels and the surgery was done as doing well. She is more painful the outside plantar aspect of the heel. She states that she was doing well up until about a week ago for the last injections. No recent injury or trauma. No tingling or numbness. She is able to wear regular shoe without any problems. Denies any systemic complaints such as fevers, chills, nausea, vomiting. No acute changes since last appointment, and no other complaints at this time.   Objective: AAO x3, NAD DP/PT pulses palpable bilaterally, CRT less than 3 seconds Protective sensation intact with Simms Weinstein monofilament Tenderness to palpation along the plantar medial tubercle of the calcaneus at the insertion of plantar fascia on the left and right foot more along the plantar lateral aspects. There is no pain along the course of the plantar fascia within the arch of the foot. Plantar fascia appears to be intact. There is no pain with lateral compression of the calcaneus or pain with vibratory sensation. There is no pain along the course or insertion of the achilles tendon. No other areas of tenderness to bilateral lower extremities. Scars well healed from prior surgery No areas of pinpoint bony tenderness or pain with vibratory sensation. MMT 5/5, ROM WNL. No edema, erythema, increase in warmth to bilateral lower extremities.  No open lesions or pre-ulcerative lesions.  No pain with calf compression, swelling, warmth, erythema  Assessment: Bilateral heel pain, likely plantar fasciitis residual.  Plan: -All treatment options discussed with the patient including all alternatives, risks, complications.  -Today she is requesting steroid injections to both heels. Patient elects to proceed with steroid injection into the  left and right heel. Under sterile skin preparation, a total of 2.5cc of kenalog 10, 0.5% Marcaine plain, and 2% lidocaine plain were infiltrated into the symptomatic area without complication. A band-aid was applied. Patient tolerated the injection well without complication. Post-injection care with discussed with the patient. Discussed with the patient to ice the area over the next couple of days to help prevent a steroid flare.  -Continue with orthotics. -Supportive shoes -Ice to the area -Refill pain medicine take as needed. She states that she went extensive nighttime. -Also can use NSAIDs prn -Night splint -Patient encouraged to call the office with any questions, concerns, change in symptoms.   Celesta Gentile, DPM

## 2016-02-04 ENCOUNTER — Other Ambulatory Visit: Payer: Self-pay

## 2016-02-04 MED ORDER — DICYCLOMINE HCL 10 MG PO CAPS: ORAL_CAPSULE | ORAL | Status: DC

## 2016-02-07 DIAGNOSIS — Z9884 Bariatric surgery status: Secondary | ICD-10-CM | POA: Diagnosis not present

## 2016-02-07 DIAGNOSIS — R531 Weakness: Secondary | ICD-10-CM | POA: Diagnosis not present

## 2016-02-07 DIAGNOSIS — E538 Deficiency of other specified B group vitamins: Secondary | ICD-10-CM | POA: Diagnosis not present

## 2016-02-07 DIAGNOSIS — D509 Iron deficiency anemia, unspecified: Secondary | ICD-10-CM | POA: Diagnosis not present

## 2016-02-17 DIAGNOSIS — R5383 Other fatigue: Secondary | ICD-10-CM | POA: Diagnosis not present

## 2016-02-17 DIAGNOSIS — G4733 Obstructive sleep apnea (adult) (pediatric): Secondary | ICD-10-CM | POA: Diagnosis not present

## 2016-02-18 DIAGNOSIS — R5383 Other fatigue: Secondary | ICD-10-CM | POA: Diagnosis not present

## 2016-02-21 DIAGNOSIS — R51 Headache: Secondary | ICD-10-CM | POA: Diagnosis not present

## 2016-02-21 DIAGNOSIS — M545 Low back pain: Secondary | ICD-10-CM | POA: Diagnosis not present

## 2016-02-21 DIAGNOSIS — G4700 Insomnia, unspecified: Secondary | ICD-10-CM | POA: Diagnosis not present

## 2016-02-21 DIAGNOSIS — R5383 Other fatigue: Secondary | ICD-10-CM | POA: Diagnosis not present

## 2016-03-09 DIAGNOSIS — D509 Iron deficiency anemia, unspecified: Secondary | ICD-10-CM | POA: Diagnosis not present

## 2016-03-09 DIAGNOSIS — R5383 Other fatigue: Secondary | ICD-10-CM | POA: Diagnosis not present

## 2016-03-09 DIAGNOSIS — E538 Deficiency of other specified B group vitamins: Secondary | ICD-10-CM | POA: Diagnosis not present

## 2016-03-09 DIAGNOSIS — Z9884 Bariatric surgery status: Secondary | ICD-10-CM | POA: Diagnosis not present

## 2016-03-10 ENCOUNTER — Encounter: Payer: BLUE CROSS/BLUE SHIELD | Attending: Oncology | Admitting: Nutrition

## 2016-03-10 ENCOUNTER — Encounter: Payer: Self-pay | Admitting: Nutrition

## 2016-03-10 DIAGNOSIS — E669 Obesity, unspecified: Secondary | ICD-10-CM | POA: Diagnosis not present

## 2016-03-10 DIAGNOSIS — Z9884 Bariatric surgery status: Secondary | ICD-10-CM

## 2016-03-10 NOTE — Progress Notes (Signed)
  Medical Nutrition Therapy:  Appt start time: 1330 end time:  1430.   Assessment:  Primary concerns today: Obesity. BMI > 40.  LIves with her husband. Is not currently working.  Eats 2-3 meals per day but snacks during the day.  Her most weight has been 320 lbs. Had gastric bypass 2008 and lost down to 232-240 lbs. Had hiatal herna surgery in 2007.  2009 had gallbladder out.. Gained 30-50 lbs iin the last 5 years. 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 Exercise:  Has had some surgeries on her feet and now is healed well enough to start light walking . Desires to weight  240's. Anemic and takes b12,Vit C and Iron fusion every 6 months. Sees  Oncologist. DIet is excessive in calories, fat, sodium and carbs and low in fresh fruits and vegetables. At high risk for DM. Hasn't been screened in a while she reports. Family Hx of Type 2 DM. Hasn't not been following the post op tips after gastric bypass. Eats quickly and drinks a lot liquids with meals and eats between meals and late at night. Willing to make changes.  Preferred Learning Style:    Visual  Learning Readiness:  Ready  Change in progress   MEDICATIONS:   DIETARY INTAKE:   24-hr recall:  B ( 9 or later AM): Eggs -2-3, or leftovers. Skips  breakfast often Snk ( AM): none L  1-2 ( PM): Ham sandwich and corn, water, Snk ( PM): Chicken wings 6 and potato wedges 4, water D ( 8-9 PM):  Rice, beef chopped and gravy, Diet Coke Snk ( PM): Licorice- twizzlers, Beverages: Diet soda and water  Usual physical activity: walking  Estimated energy needs: 1500 calories 170 g carbohydrates 112 g protein 42 g fat  Progress Towards Goal(s):  In progress.   Nutritional Diagnosis:  NB-1.1 Food and nutrition-related knowledge deficit As related to Obesity.  As evidenced by BMI > 40..    Intervention:  Healthy weight loss tips, My Plate, portion sizes, post op gastric bypass tips, importance of timing of meals and avoiding  snacks, avoiding emotional eating and benefits of exercise. High Fiber, Low Fat Low Sodium Diet Keeping a food journal.  Goals 1. Follow Plate Method 2. Cut out snacks between meals. unless it a protein or vegetables. 3. Drink only water 4. Avoid liquids 30 minutes before or 15 minutes after meals. 5. Increase fresh fruits and vegetables 6. Walk 15 minutes per day. 7. Lose 1-2 lbs per week.  Teaching Method Utilized:   Visual Auditory Hands on  Handouts given during visit include:  The Plate Method  Meal Plan Card  Post op Tips  Barriers to learning/adherence to lifestyle change: None  Demonstrated degree of understanding via:  Teach Back   Monitoring/Evaluation:  Dietary intake, exercise, meal planing, and body weight in 1 month(s).

## 2016-03-10 NOTE — Patient Instructions (Addendum)
Goals 1. Follow Plate Method 2. Cut out snacks between meals. unless it a protein or vegetables. 3. Drink only water 4. Avoid liquids 30 minutes before or 15 minutes after meals. 5. Increase fresh fruits and vegetables 6. Walk 15 minutes per day. 7. Lose 1-2 lbs per week. Keep a food journal

## 2016-03-14 ENCOUNTER — Encounter (HOSPITAL_COMMUNITY): Payer: Self-pay | Admitting: Oncology

## 2016-03-14 ENCOUNTER — Other Ambulatory Visit (HOSPITAL_COMMUNITY): Payer: Self-pay | Admitting: Oncology

## 2016-03-14 DIAGNOSIS — M62830 Muscle spasm of back: Secondary | ICD-10-CM | POA: Diagnosis not present

## 2016-03-14 DIAGNOSIS — E538 Deficiency of other specified B group vitamins: Secondary | ICD-10-CM

## 2016-03-14 DIAGNOSIS — F329 Major depressive disorder, single episode, unspecified: Secondary | ICD-10-CM | POA: Diagnosis not present

## 2016-03-14 DIAGNOSIS — M545 Low back pain: Secondary | ICD-10-CM | POA: Diagnosis not present

## 2016-03-14 DIAGNOSIS — D509 Iron deficiency anemia, unspecified: Secondary | ICD-10-CM

## 2016-03-14 DIAGNOSIS — G4733 Obstructive sleep apnea (adult) (pediatric): Secondary | ICD-10-CM | POA: Diagnosis not present

## 2016-03-14 DIAGNOSIS — Z9884 Bariatric surgery status: Secondary | ICD-10-CM

## 2016-03-14 HISTORY — DX: Deficiency of other specified B group vitamins: E53.8

## 2016-03-14 HISTORY — DX: Bariatric surgery status: Z98.84

## 2016-03-14 HISTORY — DX: Iron deficiency anemia, unspecified: D50.9

## 2016-03-24 DIAGNOSIS — F329 Major depressive disorder, single episode, unspecified: Secondary | ICD-10-CM | POA: Diagnosis not present

## 2016-03-24 DIAGNOSIS — R937 Abnormal findings on diagnostic imaging of other parts of musculoskeletal system: Secondary | ICD-10-CM | POA: Diagnosis not present

## 2016-03-24 DIAGNOSIS — M545 Low back pain: Secondary | ICD-10-CM | POA: Diagnosis not present

## 2016-03-24 DIAGNOSIS — G4700 Insomnia, unspecified: Secondary | ICD-10-CM | POA: Diagnosis not present

## 2016-03-24 DIAGNOSIS — M47816 Spondylosis without myelopathy or radiculopathy, lumbar region: Secondary | ICD-10-CM | POA: Diagnosis not present

## 2016-03-24 DIAGNOSIS — R51 Headache: Secondary | ICD-10-CM | POA: Diagnosis not present

## 2016-03-24 DIAGNOSIS — M62830 Muscle spasm of back: Secondary | ICD-10-CM | POA: Diagnosis not present

## 2016-03-28 DIAGNOSIS — S134XXA Sprain of ligaments of cervical spine, initial encounter: Secondary | ICD-10-CM | POA: Diagnosis not present

## 2016-03-28 DIAGNOSIS — S338XXA Sprain of other parts of lumbar spine and pelvis, initial encounter: Secondary | ICD-10-CM | POA: Diagnosis not present

## 2016-03-28 DIAGNOSIS — M546 Pain in thoracic spine: Secondary | ICD-10-CM | POA: Diagnosis not present

## 2016-03-30 DIAGNOSIS — Z90710 Acquired absence of both cervix and uterus: Secondary | ICD-10-CM | POA: Diagnosis not present

## 2016-03-30 DIAGNOSIS — I878 Other specified disorders of veins: Secondary | ICD-10-CM | POA: Diagnosis not present

## 2016-03-30 DIAGNOSIS — R109 Unspecified abdominal pain: Secondary | ICD-10-CM | POA: Diagnosis not present

## 2016-03-30 DIAGNOSIS — M545 Low back pain: Secondary | ICD-10-CM | POA: Diagnosis not present

## 2016-03-30 DIAGNOSIS — Z9884 Bariatric surgery status: Secondary | ICD-10-CM | POA: Diagnosis not present

## 2016-03-30 DIAGNOSIS — R938 Abnormal findings on diagnostic imaging of other specified body structures: Secondary | ICD-10-CM | POA: Diagnosis not present

## 2016-03-31 DIAGNOSIS — S338XXA Sprain of other parts of lumbar spine and pelvis, initial encounter: Secondary | ICD-10-CM | POA: Diagnosis not present

## 2016-03-31 DIAGNOSIS — S134XXA Sprain of ligaments of cervical spine, initial encounter: Secondary | ICD-10-CM | POA: Diagnosis not present

## 2016-03-31 DIAGNOSIS — M546 Pain in thoracic spine: Secondary | ICD-10-CM | POA: Diagnosis not present

## 2016-04-03 ENCOUNTER — Encounter (HOSPITAL_COMMUNITY): Payer: BLUE CROSS/BLUE SHIELD | Attending: Hematology & Oncology

## 2016-04-03 DIAGNOSIS — D509 Iron deficiency anemia, unspecified: Secondary | ICD-10-CM | POA: Insufficient documentation

## 2016-04-03 DIAGNOSIS — E538 Deficiency of other specified B group vitamins: Secondary | ICD-10-CM | POA: Insufficient documentation

## 2016-04-03 DIAGNOSIS — Z9884 Bariatric surgery status: Secondary | ICD-10-CM | POA: Insufficient documentation

## 2016-04-03 LAB — IRON AND TIBC
Iron: 90 ug/dL (ref 28–170)
SATURATION RATIOS: 28 % (ref 10.4–31.8)
TIBC: 319 ug/dL (ref 250–450)
UIBC: 229 ug/dL

## 2016-04-03 LAB — COMPREHENSIVE METABOLIC PANEL
ALBUMIN: 4.4 g/dL (ref 3.5–5.0)
ALT: 55 U/L — ABNORMAL HIGH (ref 14–54)
AST: 38 U/L (ref 15–41)
Alkaline Phosphatase: 103 U/L (ref 38–126)
Anion gap: 7 (ref 5–15)
BUN: 15 mg/dL (ref 6–20)
CHLORIDE: 103 mmol/L (ref 101–111)
CO2: 26 mmol/L (ref 22–32)
Calcium: 8.7 mg/dL — ABNORMAL LOW (ref 8.9–10.3)
Creatinine, Ser: 0.78 mg/dL (ref 0.44–1.00)
GFR calc Af Amer: >60 mL/min (ref >60)
GFR calc non Af Amer: >60 mL/min (ref >60)
GLUCOSE: 94 mg/dL (ref 65–99)
POTASSIUM: 3.9 mmol/L (ref 3.5–5.1)
Sodium: 136 mmol/L (ref 135–145)
Total Bilirubin: 0.9 mg/dL (ref 0.3–1.2)
Total Protein: 7.8 g/dL (ref 6.5–8.1)

## 2016-04-03 LAB — CBC WITH DIFFERENTIAL/PLATELET
BASOS ABS: 0.1 10*3/uL (ref 0.0–0.1)
Basophils Relative: 1 %
EOS ABS: 0.2 10*3/uL (ref 0.0–0.7)
EOS PCT: 2 %
HCT: 45.6 % (ref 36.0–46.0)
Hemoglobin: 14.9 g/dL (ref 12.0–15.0)
Lymphocytes Relative: 26 %
Lymphs Abs: 1.9 10*3/uL (ref 0.7–4.0)
MCH: 30 pg (ref 26.0–34.0)
MCHC: 32.7 g/dL (ref 30.0–36.0)
MCV: 91.8 fL (ref 78.0–100.0)
MONO ABS: 0.5 10*3/uL (ref 0.1–1.0)
Monocytes Relative: 6 %
Neutro Abs: 4.8 10*3/uL (ref 1.7–7.7)
Neutrophils Relative %: 65 %
PLATELETS: 169 10*3/uL (ref 150–400)
RBC: 4.97 MIL/uL (ref 3.87–5.11)
RDW: 14 % (ref 11.5–15.5)
WBC: 7.4 10*3/uL (ref 4.0–10.5)

## 2016-04-03 LAB — VITAMIN B12: VITAMIN B 12: 476 pg/mL (ref 180–914)

## 2016-04-03 LAB — FOLATE: Folate: 15.1 ng/mL (ref >5.9)

## 2016-04-03 LAB — FERRITIN: Ferritin: 115 ng/mL (ref 11–307)

## 2016-04-04 LAB — VITAMIN D 25 HYDROXY (VIT D DEFICIENCY, FRACTURES): Vit D, 25-Hydroxy: 34.9 ng/mL (ref 30.0–100.0)

## 2016-04-05 LAB — HOMOCYSTEINE: HOMOCYSTEINE-NORM: 10.5 umol/L (ref 0.0–15.0)

## 2016-04-05 LAB — ANTI-PARIETAL ANTIBODY: Parietal Cell Antibody-IgG: 3.1 Units (ref 0.0–20.0)

## 2016-04-05 LAB — INTRINSIC FACTOR ANTIBODIES: Intrinsic Factor: 1 AU/mL (ref 0.0–1.1)

## 2016-04-06 LAB — COPPER, SERUM: COPPER: 116 ug/dL (ref 72–166)

## 2016-04-06 LAB — METHYLMALONIC ACID, SERUM: METHYLMALONIC ACID, QUANTITATIVE: 163 nmol/L (ref 0–378)

## 2016-04-10 ENCOUNTER — Encounter: Payer: BLUE CROSS/BLUE SHIELD | Attending: Oncology | Admitting: Nutrition

## 2016-04-10 ENCOUNTER — Encounter: Payer: Self-pay | Admitting: Nutrition

## 2016-04-10 DIAGNOSIS — Z9884 Bariatric surgery status: Secondary | ICD-10-CM

## 2016-04-10 DIAGNOSIS — E669 Obesity, unspecified: Secondary | ICD-10-CM | POA: Insufficient documentation

## 2016-04-10 NOTE — Progress Notes (Signed)
  Medical Nutrition Therapy:  Appt start time: 1630 end time:  1700.  Assessment:  Primary concerns today:  Follow up Obesity. BMI > 40.  S/p gastric bypass 2008. Changes made since last visit: cut out snacks, diet cokes, rice. She is eating a lot more fresh fruits and vegetables. Lost 18 lbs since last visit. She is drinking more water, trying to exercise more. More mindful of her eating habits. Still getting B12 shot montly and iron infusion. Needs to increase calcium due to low Vit D levels she reports. Making excellent progress. Avoiding processed foods.     Preferred Learning Style:    Visual  Learning Readiness:  Ready  Change in progress   MEDICATIONS:   DIETARY INTAKE:   24-hr recall:  B ( 9 or later AM): Eggs -2-3, or leftovers.   Snk ( AM): none L  1-2 ( PM): Veggies or salad and fruit, water, Snk ( PM):  D ( 8-9 PM): Meat, steamed vegetables, water Snk ( PM):  Beverages: water Usual physical activity: walking and VBS.  Estimated energy needs: 1500 calories 170 g carbohydrates 112 g protein 42 g fat  Progress Towards Goal(s):  In progress.   Nutritional Diagnosis:  NB-1.1 Food and nutrition-related knowledge deficit As related to Obesity.  As evidenced by BMI > 40..    Intervention:  Healthy weight loss tips, My Plate, portion sizes, post op gastric bypass tips, importance of timing of meals and avoiding snacks, avoiding emotional eating and benefits of exercise. High Fiber, Low Fat Low Sodium Diet Keeping a food journal.  Goals 1. Follow Plate Method High Protein;  Lower Carb 2. Increase physical activity. 3. Don't skip meals 4. Increase fresh vegetables and whole grains and good quality protein. 5. Lose 1-2 lbs per week.  6. Get down to 260 lbs in 2 months.   Teaching Method Utilized:   Visual Auditory Hands on  Handouts given during visit include:  The Plate Method  Meal Plan Card  Post op Tips  Barriers to learning/adherence to lifestyle  change: None  Demonstrated degree of understanding via:  Teach Back   Monitoring/Evaluation:  Dietary intake, exercise, meal planing, and body weight in 1 month(s).

## 2016-04-10 NOTE — Patient Instructions (Signed)
Follow High Protein High Fiber Diet Don't skip meals Increase low carb vegetables and protein. Increase physical activity. Keep up the good work! Lose 1-2 lbs per week Goal wt 260 lbs.

## 2016-04-12 ENCOUNTER — Ambulatory Visit (HOSPITAL_COMMUNITY): Payer: BLUE CROSS/BLUE SHIELD

## 2016-04-13 ENCOUNTER — Encounter (HOSPITAL_COMMUNITY): Payer: Self-pay | Admitting: Oncology

## 2016-04-13 ENCOUNTER — Encounter (HOSPITAL_BASED_OUTPATIENT_CLINIC_OR_DEPARTMENT_OTHER): Payer: BLUE CROSS/BLUE SHIELD | Admitting: Oncology

## 2016-04-13 ENCOUNTER — Encounter (HOSPITAL_BASED_OUTPATIENT_CLINIC_OR_DEPARTMENT_OTHER): Payer: BLUE CROSS/BLUE SHIELD

## 2016-04-13 VITALS — BP 124/58 | HR 85 | Temp 98.8°F | Resp 18 | Ht 66.0 in | Wt 270.6 lb

## 2016-04-13 DIAGNOSIS — E538 Deficiency of other specified B group vitamins: Secondary | ICD-10-CM

## 2016-04-13 DIAGNOSIS — Z9884 Bariatric surgery status: Secondary | ICD-10-CM

## 2016-04-13 DIAGNOSIS — D509 Iron deficiency anemia, unspecified: Secondary | ICD-10-CM

## 2016-04-13 MED ORDER — CYANOCOBALAMIN 1000 MCG/ML IJ SOLN
1000.0000 ug | Freq: Once | INTRAMUSCULAR | Status: AC
  Administered 2016-04-13: 1000 ug via INTRAMUSCULAR
  Filled 2016-04-13: qty 1

## 2016-04-13 NOTE — Progress Notes (Unsigned)
Jody Stokes presents today for injection per the provider's orders.  b12 injection administration without incident; see MAR for injection details.  Patient tolerated procedure well and without incident.  No questions or complaints noted at this time.
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
## 2016-04-13 NOTE — Assessment & Plan Note (Addendum)
Iron deficiency anemia secondary to malabsorption in the setting of Bariatric surgery in 2008, requiring IV iron replacement when indicated.  Labs on 04/03/2016: CBC diff, CMET, anemia panel, anti-parietal cell antibody, intrinsic factor antibody, Vit D level, copper level.  I personally reviewed and went over laboratory results with the patient.  The results are noted within this dictation.  Labs are great.  Ferritin is > 100.  HGB is WNL.    Labs in 8 and 16 weeks: CBC diff, iron/TIBC, ferritin.  She knows to call us in the interim if she develops signs/symptoms of iron deficiency anemia such as ice cravings. Return in 4 months for follow-up

## 2016-04-13 NOTE — Assessment & Plan Note (Signed)
Gastric bypass in 2008 resulting in malabsorption in iron and B12.  I personally reviewed and went over laboratory results with the patient.  The results are noted within this dictation.  Labs in 6 months: Vit D, copper level

## 2016-04-13 NOTE — Assessment & Plan Note (Addendum)
B12 deficiency requiring IM replacement.  Supportive therapy plan is built.  B12 injection today and then monthly.  Labs in 6 months: B12

## 2016-04-13 NOTE — Patient Instructions (Addendum)
[place] at [facility] Discharge Instructions  RECOMMENDATIONS MADE BY THE CONSULTANT AND ANY TEST RESULTS WILL BE SENT TO YOUR REFERRING PHYSICIAN.  Labs recently are great.  We will continue to monitor blood work moving forward.   Today, you received B12 injection.  We will continue these as planned every 4 weeks. We will continue to monitor your iron studies and provide IV iron when indicated. Return in 2 months and 4 months for labs.  We will coordinate these lab appointment to be on the same day as your B12 injections. For constipation, you can try Senokot-S which is over the counter medication.  You can take 2-4 tablets daily for constipation. Return in 4 months for follow-up.  Thank you for choosing [place] at [facility] to provide your oncology and hematology care.  To afford each patient quality time with our provider, please arrive at least 15 minutes before your scheduled appointment time.   Beginning January 23rd 2017 lab work for the [facility] will be done in the  Main lab at [facility] on 1st floor. If you have a lab appointment with the [facility] please come in thru the  Main Entrance and check in at the main information desk  You need to re-schedule your appointment should you arrive 10 or more minutes late.  We strive to give you quality time with our providers, and arriving late affects you and other patients whose appointments are after yours.  Also, if you no show three or more times for appointments you may be dismissed from the clinic at the providers discretion.     Again, thank you for choosing [facility].  Our hope is that these requests will decrease the amount of time that you wait before being seen by our physicians.       _____________________________________________________________  Should you have questions after your visit to [facility], please contact our office at [phone]  [phone] between the hours of 8:30 a.m. and 4:30 p.m.  Voicemails left after 4:30 p.m. will not be returned until the following business day.  For prescription refill requests, have your pharmacy contact our office.         Resources For Cancer Patients and their Caregivers ? American Cancer Society: Can assist with transportation, wigs, general needs, runs Look Good Feel Better.        414-352-5088 ? Cancer Care: Provides financial assistance, online support groups, medication/co-pay assistance.  1-800-813-HOPE (513)494-1619) ? [employer] Assists [place] cancer patients and their families through emotional , educational and financial support.  (938)684-9382 ? [place] DSS Where to apply for food stamps, Medicaid and utility assistance. (848) 835-3064 ? RCATS: Transportation to medical appointments. (979)294-6184 ? Social Security Administration: May apply for disability if have a Stage IV cancer. 586-144-8430 (878)318-7832 ? [employer], Disability and Transit Services: Assists with nutrition, care and transit needs. [place] Support Programs: @10RELATIVEDAYS @ > Cancer Support Group  2nd Tuesday of the month 1pm-2pm, Journey Room  > Creative Journey  3rd Tuesday of the month 1130am-1pm, Journey Room  > Look Good Feel Better  1st Wednesday of the month 10am-12 noon, Journey Room (Call [place] to register [phone])

## 2016-04-14 DIAGNOSIS — M545 Low back pain: Secondary | ICD-10-CM | POA: Diagnosis not present

## 2016-04-14 DIAGNOSIS — G4700 Insomnia, unspecified: Secondary | ICD-10-CM | POA: Diagnosis not present

## 2016-04-14 DIAGNOSIS — R5383 Other fatigue: Secondary | ICD-10-CM | POA: Diagnosis not present

## 2016-04-14 DIAGNOSIS — Z6841 Body Mass Index (BMI) 40.0 and over, adult: Secondary | ICD-10-CM | POA: Diagnosis not present

## 2016-04-19 ENCOUNTER — Encounter (INDEPENDENT_AMBULATORY_CARE_PROVIDER_SITE_OTHER): Payer: Self-pay | Admitting: Internal Medicine

## 2016-04-24 ENCOUNTER — Encounter: Payer: Self-pay | Admitting: Podiatry

## 2016-04-24 NOTE — Progress Notes (Signed)
Surgery, Endoscopic Plantar Fasciotomy and Heel Spur Resection right foot, was performed at [facility].

## 2016-05-11 ENCOUNTER — Encounter (HOSPITAL_COMMUNITY): Payer: BLUE CROSS/BLUE SHIELD | Attending: Hematology & Oncology

## 2016-05-11 ENCOUNTER — Encounter: Payer: BLUE CROSS/BLUE SHIELD | Attending: Oncology | Admitting: Nutrition

## 2016-05-11 VITALS — BP 131/86 | HR 83 | Temp 98.1°F | Resp 18

## 2016-05-11 DIAGNOSIS — E669 Obesity, unspecified: Secondary | ICD-10-CM | POA: Diagnosis not present

## 2016-05-11 DIAGNOSIS — E538 Deficiency of other specified B group vitamins: Secondary | ICD-10-CM

## 2016-05-11 DIAGNOSIS — Z9889 Other specified postprocedural states: Secondary | ICD-10-CM

## 2016-05-11 DIAGNOSIS — Z9884 Bariatric surgery status: Secondary | ICD-10-CM | POA: Insufficient documentation

## 2016-05-11 DIAGNOSIS — D509 Iron deficiency anemia, unspecified: Secondary | ICD-10-CM | POA: Insufficient documentation

## 2016-05-11 MED ORDER — CYANOCOBALAMIN 1000 MCG/ML IJ SOLN
1000.0000 ug | Freq: Once | INTRAMUSCULAR | Status: AC
  Administered 2016-05-11: 1000 ug via INTRAMUSCULAR
  Filled 2016-05-11: qty 1

## 2016-05-11 NOTE — Patient Instructions (Addendum)
Goals 1. Lose 1-2 lbs per week. 2. Increase more lower carb vegetables with meals 3. Increase physical activity 4. Add protein with all meals. 5. Chew foods thoroughly. Take MVI and VIt D daily.

## 2016-05-11 NOTE — Progress Notes (Signed)
Jody Stokes presents today for injection per MD orders. B12 1000 mcg administered IM in right Upper Arm. Administration without incident. Patient tolerated well.

## 2016-05-11 NOTE — Patient Instructions (Signed)
[place] at [facility] Discharge Instructions  RECOMMENDATIONS MADE BY THE CONSULTANT AND ANY TEST RESULTS WILL BE SENT TO YOUR REFERRING PHYSICIAN.  Vitamin B12 1000 mcg injection given today as ordered. Return as scheduled for monthly B12 injection.  Thank you for choosing [place] at [facility] to provide your oncology and hematology care.  To afford each patient quality time with our provider, please arrive at least 15 minutes before your scheduled appointment time.   Beginning January 23rd 2017 lab work for the [facility] will be done in the  Main lab at [facility] on 1st floor. If you have a lab appointment with the [facility] please come in thru the  Main Entrance and check in at the main information desk  You need to re-schedule your appointment should you arrive 10 or more minutes late.  We strive to give you quality time with our providers, and arriving late affects you and other patients whose appointments are after yours.  Also, if you no show three or more times for appointments you may be dismissed from the clinic at the providers discretion.     Again, thank you for choosing [facility].  Our hope is that these requests will decrease the amount of time that you wait before being seen by our physicians.       _____________________________________________________________  Should you have questions after your visit to [facility], please contact our office at [phone] between the hours of 8:30 a.m. and 4:30 p.m.  Voicemails left after 4:30 p.m. will not be returned until the following business day.  For prescription refill requests, have your pharmacy contact our office.         Resources For Cancer Patients and their Caregivers ? American Cancer Society: Can assist with transportation, wigs, general needs, runs Look Good Feel Better.        240-162-1217 ? Cancer Care: Provides  financial assistance, online support groups, medication/co-pay assistance.  1-800-813-HOPE 218 813 6420) ?  [place] Assists [place] cancer patients and their families through emotional , educational and financial support.  (707)835-2868 ? [place] DSS Where to apply for food stamps, Medicaid and utility assistance. (516)821-0483 ? RCATS: Transportation to medical appointments. 412-568-1959 ? Social Security Administration: May apply for disability if have a Stage IV cancer. (660)417-3820 870-687-7165 ? [employer], Disability and Transit Services: Assists with nutrition, care and transit needs. [place] Support Programs: @10RELATIVEDAYS @ > Cancer Support Group  2nd Tuesday of the month 1pm-2pm, Journey Room  > Creative Journey  3rd Tuesday of the month 1130am-1pm, Journey Room  > Look Good Feel Better  1st Wednesday of the month 10am-12 noon, Journey Room (Call [place] to register [phone])

## 2016-05-11 NOTE — Progress Notes (Signed)
  Medical Nutrition Therapy:  Appt start time: 1400end time:  1430.   Assessment:  Primary concerns today: Obesity. BMI > 40. Lost 7 lbs.  Has cut out snacks. Eating more protein and more vegetables. Still going to the pool for exercising.    Preferred Learning Style:    Visual  Learning Readiness:  Ready  Change in progress   MEDICATIONS:   DIETARY INTAKE:   24-hr recall:  B ( 9 or later AM):2 slices ww toast with coffee breakfast often Snk ( AM): none L  1-2 ( PM):2 boiled eggs, water Snk ( PM):  D (7 pm PM): [place] and cantelou or tortilla soup. pd and gravy,  Snk ( PM): water Beverages: water  Usual physical activity: walking  Estimated energy needs: 1500 calories 170 g carbohydrates 112 g protein 42 g fat  Progress Towards Goal(s):  In progress.   Nutritional Diagnosis:  NB-1.1 Food and nutrition-related knowledge deficit As related to Obesity.  As evidenced by BMI > 40..    Intervention:  Healthy weight loss tips, My Plate, portion sizes, post op gastric bypass tips, importance of timing of meals and avoiding snacks, avoiding emotional eating and benefits of exercise. High Fiber, Low Fat Low Sodium Diet Keeping a food journal.  Goals 1. Lose 1-2 lbs per week. 2. Increase more lower carb vegetables with meals 3. Increase physical activity 4. Add protein with all meals. 5. Chew foods thoroughly. Take MVI and VIt D daily.   Teaching Method Utilized:   Visual Auditory Hands on  Handouts given during visit include:  The Plate Method  Meal Plan Card  Post op Tips  Barriers to learning/adherence to lifestyle change: None  Demonstrated degree of understanding via:  Teach Back   Monitoring/Evaluation:  Dietary intake, exercise, meal planing, and body weight in 1 month(s).

## 2016-05-14 ENCOUNTER — Encounter (HOSPITAL_COMMUNITY): Payer: Self-pay | Admitting: Oncology

## 2016-05-18 ENCOUNTER — Ambulatory Visit (INDEPENDENT_AMBULATORY_CARE_PROVIDER_SITE_OTHER): Payer: BLUE CROSS/BLUE SHIELD | Admitting: Internal Medicine

## 2016-06-08 ENCOUNTER — Encounter (HOSPITAL_COMMUNITY): Payer: Self-pay

## 2016-06-08 ENCOUNTER — Encounter: Payer: Self-pay | Admitting: Nutrition

## 2016-06-08 ENCOUNTER — Encounter (HOSPITAL_COMMUNITY): Payer: BLUE CROSS/BLUE SHIELD | Attending: Hematology & Oncology

## 2016-06-08 ENCOUNTER — Encounter: Payer: BLUE CROSS/BLUE SHIELD | Attending: Family Medicine | Admitting: Nutrition

## 2016-06-08 VITALS — Ht 65.0 in | Wt 264.0 lb

## 2016-06-08 VITALS — BP 125/69 | HR 85 | Temp 98.3°F | Resp 20

## 2016-06-08 DIAGNOSIS — Z9884 Bariatric surgery status: Secondary | ICD-10-CM | POA: Diagnosis not present

## 2016-06-08 DIAGNOSIS — E538 Deficiency of other specified B group vitamins: Secondary | ICD-10-CM

## 2016-06-08 DIAGNOSIS — D509 Iron deficiency anemia, unspecified: Secondary | ICD-10-CM | POA: Diagnosis not present

## 2016-06-08 LAB — CBC WITH DIFFERENTIAL/PLATELET
BASOS PCT: 1 %
Basophils Absolute: 0.1 10*3/uL (ref 0.0–0.1)
EOS ABS: 0.2 10*3/uL (ref 0.0–0.7)
EOS PCT: 3 %
HCT: 42.7 % (ref 36.0–46.0)
HEMOGLOBIN: 13.8 g/dL (ref 12.0–15.0)
Lymphocytes Relative: 27 %
Lymphs Abs: 2.1 10*3/uL (ref 0.7–4.0)
MCH: 29.7 pg (ref 26.0–34.0)
MCHC: 32.3 g/dL (ref 30.0–36.0)
MCV: 91.8 fL (ref 78.0–100.0)
MONO ABS: 0.5 10*3/uL (ref 0.1–1.0)
MONOS PCT: 7 %
NEUTROS PCT: 62 %
Neutro Abs: 4.9 10*3/uL (ref 1.7–7.7)
PLATELETS: 220 10*3/uL (ref 150–400)
RBC: 4.65 MIL/uL (ref 3.87–5.11)
RDW: 14.4 % (ref 11.5–15.5)
WBC: 7.8 10*3/uL (ref 4.0–10.5)

## 2016-06-08 LAB — IRON AND TIBC
IRON: 73 ug/dL (ref 28–170)
Saturation Ratios: 22 % (ref 10.4–31.8)
TIBC: 336 ug/dL (ref 250–450)
UIBC: 263 ug/dL

## 2016-06-08 LAB — FERRITIN: Ferritin: 63 ng/mL (ref 11–307)

## 2016-06-08 MED ORDER — CYANOCOBALAMIN 1000 MCG/ML IJ SOLN
INTRAMUSCULAR | Status: AC
  Filled 2016-06-08: qty 1

## 2016-06-08 MED ORDER — CYANOCOBALAMIN 1000 MCG/ML IJ SOLN
1000.0000 ug | Freq: Once | INTRAMUSCULAR | Status: AC
  Administered 2016-06-08: 1000 ug via INTRAMUSCULAR

## 2016-06-08 NOTE — Patient Instructions (Signed)
Goals 1. Increase protein to 60-80 oz of protein per day. 2. Increase vegetables to 2 servings per meals 3. Increase physical activity to 60 minutes 4 days a week. 4. Lose 1-2 lbs per week.

## 2016-06-08 NOTE — Patient Instructions (Signed)
[facility] at [facility] Discharge Instructions  RECOMMENDATIONS MADE BY THE CONSULTANT AND ANY TEST RESULTS WILL BE SENT TO YOUR REFERRING PHYSICIAN.   You were given a B12 injection today. Return as scheduled.   Thank you for choosing [facility] at [facility] to provide your oncology and hematology care.  To afford each patient quality time with our provider, please arrive at least 15 minutes before your scheduled appointment time.   Beginning January 23rd 2017 lab work for the Cancer Center will be done in the  Main lab at [facility] on 1st floor. If you have a lab appointment with the Cancer Center please come in thru the  Main Entrance and check in at the main information desk  You need to re-schedule your appointment should you arrive 10 or more minutes late.  We strive to give you quality time with our providers, and arriving late affects you and other patients whose appointments are after yours.  Also, if you no show three or more times for appointments you may be dismissed from the clinic at the providers discretion.     Again, thank you for choosing [facility].  Our hope is that these requests will decrease the amount of time that you wait before being seen by our physicians.       _____________________________________________________________  Should you have questions after your visit to [facility], please contact our office at [phone] between the hours of 8:30 a.m. and 4:30 p.m.  Voicemails left after 4:30 p.m. will not be returned until the following business day.  For prescription refill requests, have your pharmacy contact our office.         Resources For Cancer Patients and their Caregivers ? American Cancer Society: Can assist with transportation, wigs, general needs, runs Look Good Feel Better.        1-888-227-6333 ? Cancer Care: Provides financial assistance, online support groups,  medication/co-pay assistance.  1-800-813-HOPE (4673) ? [facility] Assists [place] cancer patients and their families through emotional , educational and financial support.  336-427-4357 ? [place] DSS Where to apply for food stamps, Medicaid and utility assistance. 336-342-1394 ? RCATS: Transportation to medical appointments. 336-347-2287 ? Social Security Administration: May apply for disability if have a Stage IV cancer. 336-342-7796 1-800-772-1213 ? [place] Aging, Disability and Transit Services: Assists with nutrition, care and transit needs. 336-349-2343  Cancer Center Support Programs: @10RELATIVEDAYS@ > Cancer Support Group  2nd Tuesday of the month 1pm-2pm, Journey Room  > Creative Journey  3rd Tuesday of the month 1130am-1pm, Journey Room  > Look Good Feel Better  1st Wednesday of the month 10am-12 noon, Journey Room (Call [employer] to register [phone])   

## 2016-06-08 NOTE — Progress Notes (Signed)
Pt here today for B12 injection. Pt given injection in left deltoid . Pt tolerated well. Pt stable and discharged home ambulatory. Pt to return as scheduled.   

## 2016-06-08 NOTE — Progress Notes (Signed)
  Medical Nutrition Therapy:  Appt start time: 1330end time:  [id number] Assessment:  Primary concerns today: Obesity. BMI > 40. Gained 1 lb . Finding it difficult to eat enough protein. Doesn't like the taste of meat. Eating vegetables. Not exercising as much as she needs to. Wants to join the [facility] for the pool and exercise equipment. Avoiding liquids 30 minutes before meals and trying to hold out til 15 minutes after meals. 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   Feels like her food sits on top of her stomach. Going to see [name] for evaluation. Drinking water.  In a size 20 instead of a 22 even though her weigh  Isn't coming off.    Willing to try to eat more dried beans and other vegetables proteins. Consider protein powders to mix with non dairy creamer for meals.    Preferred Learning Style:    Visual  Learning Readiness:  Ready  Change in progress   MEDICATIONS:   DIETARY INTAKE:   24-hr recall:  B ( 9 or later AM):2 slices ww toast eggs, cheese, coffee  Snk ( AM): none L  1-2 ( PM):2 boiled eggs, water and or salad Snk ( PM):  D (7 pm PM):eggs, veggies- spinach/kale. Snk ( PM): water Beverages: water  Usual physical activity: going to start going to the [facility].  Estimated energy needs: 1500 calories 170 g carbohydrates 112 g protein 42 g fat  Progress Towards Goal(s):  In progress.   Nutritional Diagnosis:  NB-1.1 Food and nutrition-related knowledge deficit As related to Obesity.  As evidenced by BMI > 40..    Intervention:  Healthy weight loss tips, My Plate, portion sizes, post op gastric bypass tips, importance of timing of meals and avoiding snacks, avoiding emotional eating and benefits of exercise. High Fiber, Low Fat Low Sodium Diet Keeping a food journal.  Goals 1. Increase protein to 60-80 oz of protein per day. 2. Increase vegetables to 2 servings per meals 3. Increase physical activity to 60 minutes 4 days a week. 4. Lose 1-2 lbs per  week.   Teaching Method Utilized:   Visual Auditory Hands on  Handouts given during visit include:  The Plate Method  Meal Plan Card  Post op Tips  Barriers to learning/adherence to lifestyle change: None  Demonstrated degree of understanding via:  Teach Back   Monitoring/Evaluation:  Dietary intake, exercise, meal planing, and body weight in 1 month(s).

## 2016-07-06 ENCOUNTER — Encounter (HOSPITAL_COMMUNITY): Payer: BLUE CROSS/BLUE SHIELD

## 2016-07-06 ENCOUNTER — Encounter (HOSPITAL_COMMUNITY): Payer: BLUE CROSS/BLUE SHIELD | Attending: Oncology

## 2016-07-06 VITALS — BP 136/79 | HR 79 | Temp 97.9°F | Resp 18

## 2016-07-06 DIAGNOSIS — E538 Deficiency of other specified B group vitamins: Secondary | ICD-10-CM | POA: Diagnosis not present

## 2016-07-06 MED ORDER — CYANOCOBALAMIN 1000 MCG/ML IJ SOLN
INTRAMUSCULAR | Status: AC
  Filled 2016-07-06: qty 1

## 2016-07-06 MED ORDER — CYANOCOBALAMIN 1000 MCG/ML IJ SOLN
1000.0000 ug | Freq: Once | INTRAMUSCULAR | Status: AC
  Administered 2016-07-06: 1000 ug via INTRAMUSCULAR

## 2016-07-06 NOTE — Patient Instructions (Signed)
[place] at [facility] Discharge Instructions  RECOMMENDATIONS MADE BY THE CONSULTANT AND ANY TEST RESULTS WILL BE SENT TO YOUR REFERRING PHYSICIAN.  Vitamin B12 1000 mcg injection given as ordered.  Thank you for choosing [place] at [facility] to provide your oncology and hematology care.  To afford each patient quality time with our provider, please arrive at least 15 minutes before your scheduled appointment time.   Beginning January 23rd 2017 lab work for the [facility] will be done in the  Main lab at [facility] on 1st floor. If you have a lab appointment with the [facility] please come in thru the  Main Entrance and check in at the main information desk  You need to re-schedule your appointment should you arrive 10 or more minutes late.  We strive to give you quality time with our providers, and arriving late affects you and other patients whose appointments are after yours.  Also, if you no show three or more times for appointments you may be dismissed from the clinic at the providers discretion.     Again, thank you for choosing [facility].  Our hope is that these requests will decrease the amount of time that you wait before being seen by our physicians.       _____________________________________________________________  Should you have questions after your visit to [facility], please contact our office at [phone] between the hours of 8:30 a.m. and 4:30 p.m.  Voicemails left after 4:30 p.m. will not be returned until the following business day.  For prescription refill requests, have your pharmacy contact our office.         Resources For Cancer Patients and their Caregivers ? American Cancer Society: Can assist with transportation, wigs, general needs, runs Look Good Feel Better.        514-045-0766 ? Cancer Care: Provides financial assistance, online support groups,  medication/co-pay assistance.  1-800-813-HOPE (808)227-9725) ? [place] Assists [place] cancer patients and their families through emotional , educational and financial support.  515-336-4521 ? [place] DSS Where to apply for food stamps, Medicaid and utility assistance. (865)243-7995 ? [facility]: Transportation to medical appointments. (352)351-9282 ? Social Security Administration: May apply for disability if have a Stage IV cancer. 307 561 4826 (628)028-7019 ? [employer], Disability and Transit Services: Assists with nutrition, care and transit needs. [place] Support Programs: @10RELATIVEDAYS @ > Cancer Support Group  2nd Tuesday of the month 1pm-2pm, Journey Room  > Creative Journey  3rd Tuesday of the month 1130am-1pm, Journey Room  > Look Good Feel Better  1st Wednesday of the month 10am-12 noon, Journey Room (Call [place] to register [phone])

## 2016-07-06 NOTE — Progress Notes (Signed)
Jody Stokes presents today for injection per MD orders. B12 1000 mcg administered IM in left Upper Arm. Administration without incident. Patient tolerated well.

## 2016-07-10 ENCOUNTER — Ambulatory Visit: Payer: BLUE CROSS/BLUE SHIELD | Admitting: Nutrition

## 2016-07-17 ENCOUNTER — Encounter: Payer: BLUE CROSS/BLUE SHIELD | Attending: Oncology | Admitting: Nutrition

## 2016-07-17 DIAGNOSIS — E669 Obesity, unspecified: Secondary | ICD-10-CM | POA: Diagnosis not present

## 2016-07-17 NOTE — Patient Instructions (Addendum)
Goals Keep up the good work 1. Continue protein and low carb vegetables. 2. Keep losing 1 lb per week 3. Chewing food slowly. Add more lower carb vegetables and plant based proteins

## 2016-07-17 NOTE — Progress Notes (Signed)
  Medical Nutrition Therapy:  Appt start time: 1615end to 1645 Assessment:  Primary concerns today: Obesity. BMI > 40. Lost 6 lbs. Working on trying to increase more protein in diet.  Drinking more protein shakes.  Increased more low carb vegetables. Drinking a lot of water.    Going to see [name] tomorrow for IBS.Marland Kitchen    Still working to try to eat more dried beans and other vegetables proteins. Consider protein powders to mix with non dairy creamer for meals.    Getting MVI and B 12 shot monthly.    Needs more consistent protein and low carb vegetables. Working on fresh fruit.   Exercise: works at the [employer].   Preferred Learning Style:    Visual  Learning Readiness:  Ready  Change in progress  MEDICATIONS:   DIETARY INTAKE:  24-hr recall:  B ( 9 or later AM):2 slices ww toast eggs, cheese, coffee  Snk ( AM): none L  1-2 ( PM) Deli meat, and some veggies.  Snk ( PM):  D (7 pm PM) Boiled 3 eggs and piece of toast Snk ( PM): water Beverages: water  Usual physical activity: going to start going to the [facility].  Estimated energy needs: 1500 calories 170 g carbohydrates 112 g protein 42 g fat  Progress Towards Goal(s):  In progress.   Nutritional Diagnosis:  NB-1.1 Food and nutrition-related knowledge deficit As related to Obesity.  As evidenced by BMI > 40..    Intervention:  Healthy weight loss tips, My Plate, portion sizes, post op gastric bypass tips, importance of timing of meals and avoiding snacks, avoiding emotional eating and benefits of exercise. High Fiber, Low Fat Low Sodium Diet   Goals Keep up the good work 1. Continue protein and low carb vegetables. 2. Keep losing 1 lb per week 3. Chewing food slowly.  Teaching Method Utilized:   Visual Auditory Hands on  Handouts given during visit include:  The Plate Method  Meal Plan Card  Post op Tips  Barriers to learning/adherence to lifestyle change: None  Demonstrated degree of  understanding via:  Teach Back   Monitoring/Evaluation:  Dietary intake, exercise, meal planing, and body weight in 6  month(s).

## 2016-07-18 ENCOUNTER — Ambulatory Visit (INDEPENDENT_AMBULATORY_CARE_PROVIDER_SITE_OTHER): Payer: BLUE CROSS/BLUE SHIELD | Admitting: Internal Medicine

## 2016-07-18 ENCOUNTER — Encounter (INDEPENDENT_AMBULATORY_CARE_PROVIDER_SITE_OTHER): Payer: Self-pay | Admitting: Internal Medicine

## 2016-07-18 VITALS — BP 128/78 | HR 72 | Temp 97.4°F | Resp 18 | Ht 66.0 in | Wt 256.6 lb

## 2016-07-18 DIAGNOSIS — R1013 Epigastric pain: Secondary | ICD-10-CM | POA: Diagnosis not present

## 2016-07-18 DIAGNOSIS — Z86010 Personal history of colonic polyps: Secondary | ICD-10-CM

## 2016-07-18 DIAGNOSIS — K580 Irritable bowel syndrome with diarrhea: Secondary | ICD-10-CM

## 2016-07-18 MED ORDER — DICYCLOMINE HCL 20 MG PO TABS: 20.0000 mg | ORAL_TABLET | Freq: Three times a day (TID) | ORAL | 5 refills | Status: DC

## 2016-07-18 MED ORDER — PANTOPRAZOLE SODIUM 40 MG PO TBEC: 40.0000 mg | DELAYED_RELEASE_TABLET | Freq: Every day | ORAL | 2 refills | Status: DC

## 2016-07-18 NOTE — Patient Instructions (Addendum)
Do not take Nabumetone or Aleve. Can take OTC Advil 2 tablets once or twice daily as needed. On office with progress report in one month. If epigastric pain does not resolve will consider esophagogastroduodenoscopy.

## 2016-07-18 NOTE — Progress Notes (Signed)
Presenting complaint;  Question regarding colonic polyps, epigastric pain and diarrhea.  History of present illness:  Jody Stokes is 55 year old African-American female who is referred through courtesy of Dr. Whitney Muse for GI evaluation. She states she is here for second opinion but she also wants to continue GI care through this office. She has history of colonic adenomas and wonders if she should be having colonoscopy more often. He believes she has had 4 colonoscopies altogether. Last colonoscopy was in for break 2016 by Dr.Rourk with removal of 4 polyps in 3 were tubular adenomas without dysplasia. These polyps at approximately 5 mm each. He had single 6 mm polyp removed when she had colonoscopy in October 2013 by Dr. Vertell Limber when she was living in [place]. She was advised to return for repeat colonoscopy in 3 years by Dr. Gala Romney. She denies rectal bleeding or melena. She complains of frequent bowel movements with urgency. Since her bowel movements are unpredictable it is source of a lot of distress for her particularly when she goes out to eat. She says at times she takes 40 mg of dicyclomine altogether. On most days however she has one to 2 stools per day. She also complains of epigastric pain and feels her food sits in her stomach and does not move distally. She has had epigastric pain for about 6 months. She denies nausea vomiting heartburn or dysphagia. She says she has been on Nabumetine for about 3 months for fibromyalgia. In addition she is taking Aleve of times a week. She says her appetite is fair. She has been gradually losing weight. She has lost 16 pounds in the last 20 months. She states her weight was 320 pounds prior to gastric bypass which she had in June 2008.    Current Medications: Outpatient Encounter Prescriptions as of 07/18/2016  Medication Sig  . Cyanocobalamin (VITAMIN B-12 IJ) Inject as directed every 30 (thirty) days.  Marland Kitchen dicyclomine (BENTYL) 10 MG capsule take 1 capsule by mouth  three times a day before meals and at bedtime as needed  . DULoxetine (CYMBALTA) 60 MG capsule Take 60 mg by mouth daily.   . nabumetone (RELAFEN) 750 MG tablet Take 750 mg by mouth 2 (two) times daily.  . Naproxen Sodium (ALEVE) 220 MG CAPS Take by mouth as needed.  . promethazine (PHENERGAN) 25 MG tablet Take 1 tablet (25 mg total) by mouth every 8 (eight) hours as needed for nausea or vomiting.  Marland Kitchen zolpidem (AMBIEN) 5 MG tablet Take 5 mg by mouth at bedtime.   . [DISCONTINUED] Ascorbic Acid (VITAMIN C) 500 MG CAPS Take by mouth.  . [DISCONTINUED] cholecalciferol (VITAMIN D) 1000 UNITS tablet Take 1,000 Units by mouth daily.   No facility-administered encounter medications on file as of 07/18/2016.    Past medical history:  History of GERD. Status post repair of esophageal hernia in 2007. Ventral herniorrhaphy in May 2008. Roux-en-Y gastric bypass for morbid obesity in June 2008. Presurgery weight was 320 pounds. Pulmonary embolism in 2008 for which she had IVC filter. cholecystectomy in April 2009. Peptic ulcer disease was diagnosed when she had EGD at the time of cholecystectomy. H. pylori status is unknown. History of colonic adenomas. Fibromyalgia. Obstructive sleep apnea. History of iron deficiency anemia and B-12 deficiency can read to bariatric surgery. IBS. Chronic insomnia. Left chest surgery at age 29 months details not available.   Allergies:  Allergies  Allergen Reactions  . Viberzi [Eluxadoline] Other (See Comments)    Patient states that she has extreme abdominal  cramping.   Family history:  Health status of father unknown. Mother committed suicide at age 88. She has 3 brothers. 2 brothers have hypertension and third brother is diabetic and on hemodialysis. He is 54 years old. She had one sister who died of HIV at age 75.  Social history:  Patient is married. She has 3 biologic biologic children in good health(son age 87 and age 38 and daughter age 4). She  has stepdaughter age 30 in good health. 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 She works 6-7 days a week. He has never smoked cigarettes. She used to drink alcohol socially but has not had any in over 30 years. Stays busy but does not do scheduled exercise or walking.   Physical examination:  Blood pressure 128/78, pulse 72, temperature 97.4 F (36.3 C), temperature source Oral, resp. rate 18, height 5\' 6"  (1.676 m), weight 256 lb 9.6 oz (116.4 kg). Patient is alert and in no acute distress. Conjunctiva is pink. Sclera is nonicteric Oropharyngeal mucosa is normal. No neck masses or thyromegaly noted. Cardiac exam with regular rhythm normal S1 and S2. No murmur or gallop noted. Lungs are clear to auscultation. Abdomen is full. Bowel sounds are normal. On palpation abdomen is soft with mild midepigastric tenderness. No organomegaly or masses noted.  No LE edema or clubbing noted.  Labs/studies Results: Lab data from 06/08/2016  WBC 7.8, H&H 13.8 and 42.7. MCV 91.8 and platelet count 220K Serum iron of 83, TIBC 336 saturation 22%.   Assessment:  #1. H/o colonic adenomas. Last TCS in Feb, 2017 with removal of four 5 mm polyps and three were tubular adenomas. Colonoscopy in 2013 revealed one 6 mm polyp.Therefore she does not need yearly colonoscopy and agree with Dr. Coralee North recommendations tha exam should be in 2019. #2. Epigastric pain possibly secondary to NSAID induced PUD. She in two NSAIDs #3. Diarrhea. Intermittent diarrhea appears to be due to dumping and IBS. IShe needs to be on anti-spasmodic on schedule and she should not take 40 mg at a time because of increased risk of side effects. She also would benefit from eating small meals and decrease intake of foods which trigger her symptoms. #4. History of B12 and iron deficiency anemia secondary to altered GI tract. She has been  poorly evaluated [facility] Recent iron studies and CBC were normal.   Recommendations:  Discontinue Alleve and Nabumetone. Dicyclomine 20 mg  by mouth 3 times a day. Pantoprazole 40 mg po qam. Consider EGD id epain not resolved within 3 to 4 weeks. Next colonoscopy in February 2019. OV in 3 months.

## 2016-07-24 ENCOUNTER — Other Ambulatory Visit (INDEPENDENT_AMBULATORY_CARE_PROVIDER_SITE_OTHER): Payer: Self-pay | Admitting: *Deleted

## 2016-07-24 DIAGNOSIS — K580 Irritable bowel syndrome with diarrhea: Secondary | ICD-10-CM

## 2016-07-24 MED ORDER — DICYCLOMINE HCL 20 MG PO TABS: 20.0000 mg | ORAL_TABLET | Freq: Three times a day (TID) | ORAL | 5 refills | Status: DC

## 2016-07-30 DIAGNOSIS — R109 Unspecified abdominal pain: Secondary | ICD-10-CM | POA: Diagnosis not present

## 2016-07-30 DIAGNOSIS — S46811A Strain of other muscles, fascia and tendons at shoulder and upper arm level, right arm, initial encounter: Secondary | ICD-10-CM | POA: Diagnosis not present

## 2016-08-03 ENCOUNTER — Encounter (HOSPITAL_COMMUNITY): Payer: BLUE CROSS/BLUE SHIELD

## 2016-08-03 ENCOUNTER — Encounter (HOSPITAL_COMMUNITY): Payer: Self-pay | Admitting: Hematology & Oncology

## 2016-08-03 ENCOUNTER — Other Ambulatory Visit (HOSPITAL_COMMUNITY): Payer: Self-pay | Admitting: Oncology

## 2016-08-03 ENCOUNTER — Encounter (HOSPITAL_COMMUNITY): Payer: Self-pay | Attending: Oncology

## 2016-08-03 ENCOUNTER — Encounter (HOSPITAL_COMMUNITY): Payer: BLUE CROSS/BLUE SHIELD | Attending: Hematology & Oncology | Admitting: Hematology & Oncology

## 2016-08-03 VITALS — BP 144/84 | HR 78 | Temp 98.5°F | Resp 16 | Wt 257.6 lb

## 2016-08-03 DIAGNOSIS — E538 Deficiency of other specified B group vitamins: Secondary | ICD-10-CM | POA: Diagnosis not present

## 2016-08-03 DIAGNOSIS — Z9884 Bariatric surgery status: Secondary | ICD-10-CM | POA: Insufficient documentation

## 2016-08-03 DIAGNOSIS — Z23 Encounter for immunization: Secondary | ICD-10-CM

## 2016-08-03 DIAGNOSIS — Z0000 Encounter for general adult medical examination without abnormal findings: Secondary | ICD-10-CM

## 2016-08-03 DIAGNOSIS — D509 Iron deficiency anemia, unspecified: Secondary | ICD-10-CM

## 2016-08-03 LAB — CBC WITH DIFFERENTIAL/PLATELET
BASOS ABS: 0.1 10*3/uL (ref 0.0–0.1)
Basophils Relative: 0 %
Eosinophils Absolute: 0 10*3/uL (ref 0.0–0.7)
Eosinophils Relative: 0 %
HEMATOCRIT: 42.5 % (ref 36.0–46.0)
Hemoglobin: 13.9 g/dL (ref 12.0–15.0)
LYMPHS PCT: 24 %
Lymphs Abs: 2.7 10*3/uL (ref 0.7–4.0)
MCH: 29.6 pg (ref 26.0–34.0)
MCHC: 32.7 g/dL (ref 30.0–36.0)
MCV: 90.4 fL (ref 78.0–100.0)
MONO ABS: 0.9 10*3/uL (ref 0.1–1.0)
Monocytes Relative: 8 %
NEUTROS ABS: 7.7 10*3/uL (ref 1.7–7.7)
Neutrophils Relative %: 68 %
Platelets: 219 10*3/uL (ref 150–400)
RBC: 4.7 MIL/uL (ref 3.87–5.11)
RDW: 14.1 % (ref 11.5–15.5)
WBC: 11.4 10*3/uL — ABNORMAL HIGH (ref 4.0–10.5)

## 2016-08-03 LAB — IRON AND TIBC
Iron: 80 ug/dL (ref 28–170)
SATURATION RATIOS: 26 % (ref 10.4–31.8)
TIBC: 314 ug/dL (ref 250–450)
UIBC: 234 ug/dL

## 2016-08-03 LAB — FERRITIN: Ferritin: 52 ng/mL (ref 11–307)

## 2016-08-03 MED ORDER — CYANOCOBALAMIN 1000 MCG/ML IJ SOLN
1000.0000 ug | Freq: Once | INTRAMUSCULAR | Status: AC
  Administered 2016-08-03: 1000 ug via INTRAMUSCULAR

## 2016-08-03 MED ORDER — INFLUENZA VAC SPLIT QUAD 0.5 ML IM SUSY
0.5000 mL | PREFILLED_SYRINGE | Freq: Once | INTRAMUSCULAR | Status: AC
  Administered 2016-08-03: 0.5 mL via INTRAMUSCULAR
  Filled 2016-08-03: qty 0.5

## 2016-08-03 MED ORDER — CYANOCOBALAMIN 1000 MCG/ML IJ SOLN
INTRAMUSCULAR | Status: AC
  Filled 2016-08-03: qty 1

## 2016-08-03 NOTE — Progress Notes (Signed)
Jody Stokes presents today for injection per MD orders. B12 1000 mcg administered IM in left Upper Arm. Administration without incident. Patient tolerated well.
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
## 2016-08-03 NOTE — Patient Instructions (Signed)
[place] at [facility] Discharge Instructions  RECOMMENDATIONS MADE BY THE CONSULTANT AND ANY TEST RESULTS WILL BE SENT TO YOUR REFERRING PHYSICIAN.  You saw Dr.Penland today.  Flu shot today.  Labs every 3 months.  Continue B12 injections  Follow up in 6 months  See Amy at checkout for appointments.  Thank you for choosing [place] at [facility] to provide your oncology and hematology care.  To afford each patient quality time with our provider, please arrive at least 15 minutes before your scheduled appointment time.   Beginning January 23rd 2017 lab work for the [facility] will be done in the  Main lab at [facility] on 1st floor. If you have a lab appointment with the [place] please come in thru the  Main Entrance and check in at the main information desk  You need to re-schedule your appointment should you arrive 10 or more minutes late.  We strive to give you quality time with our providers, and arriving late affects you and other patients whose appointments are after yours.  Also, if you no show three or more times for appointments you may be dismissed from the clinic at the providers discretion.     Again, thank you for choosing [facility].  Our hope is that these requests will decrease the amount of time that you wait before being seen by our physicians.       _____________________________________________________________  Should you have questions after your visit to [facility], please contact our office at [phone] between the hours of 8:30 a.m. and 4:30 p.m.  Voicemails left after 4:30 p.m. will not be returned until the following business day.  For prescription refill requests, have your pharmacy contact our office.         Resources For Cancer Patients and their Caregivers ? American Cancer Society: Can assist with transportation, wigs, general needs, runs Look Good Feel  Better.        414 610 4978 ? Cancer Care: Provides financial assistance, online support groups, medication/co-pay assistance.  1-800-813-HOPE 305-702-7949) ? [place] Assists [place] cancer patients and their families through emotional , educational and financial support.  431 296 6092 ? [place] DSS Where to apply for food stamps, Medicaid and utility assistance. 825-415-0414 ? RCATS: Transportation to medical appointments. 619-275-0792 ? Social Security Administration: May apply for disability if have a Stage IV cancer. 438-618-6776 684-382-1867 ? [employer], Disability and Transit Services: Assists with nutrition, care and transit needs. [place] Support Programs: @10RELATIVEDAYS @ > Cancer Support Group  2nd Tuesday of the month 1pm-2pm, Journey Room  > Creative Journey  3rd Tuesday of the month 1130am-1pm, Journey Room  > Look Good Feel Better  1st Wednesday of the month 10am-12 noon, Journey Room (Call American Cancer Society to register [phone])   Influenza Virus Vaccine injection (Fluarix) What is this medicine? INFLUENZA VIRUS VACCINE (in floo EN zuh VAHY ruhs vak SEEN) helps to reduce the risk of getting influenza also known as the flu. This medicine may be used for other purposes; ask your health care provider or pharmacist if you have questions. What should I tell my health care provider before I take this medicine? They need to know if you have any of these conditions: -bleeding disorder like hemophilia -fever or infection -Guillain-Barre syndrome or other neurological problems -immune system problems -infection with the human immunodeficiency virus (HIV) or AIDS -low  blood platelet counts -multiple sclerosis -an unusual or allergic reaction to influenza virus vaccine, eggs, chicken proteins, latex, gentamicin, other medicines, foods, dyes or preservatives -pregnant or trying to get  pregnant -breast-feeding How should I use this medicine? This vaccine is for injection into a muscle. It is given by a health care professional. A copy of Vaccine Information Statements will be given before each vaccination. Read this sheet carefully each time. The sheet may change frequently. Talk to your pediatrician regarding the use of this medicine in children. Special care may be needed. Overdosage: If you think you have taken too much of this medicine contact a poison control center or emergency room at once. NOTE: This medicine is only for you. Do not share this medicine with others. What if I miss a dose? This does not apply. What may interact with this medicine? -chemotherapy or radiation therapy -medicines that lower your immune system like etanercept, anakinra, infliximab, and adalimumab -medicines that treat or prevent blood clots like warfarin -phenytoin -steroid medicines like prednisone or cortisone -theophylline -vaccines This list may not describe all possible interactions. Give your health care provider a list of all the medicines, herbs, non-prescription drugs, or dietary supplements you use. Also tell them if you smoke, drink alcohol, or use illegal drugs. Some items may interact with your medicine. What should I watch for while using this medicine? Report any side effects that do not go away within 3 days to your doctor or health care professional. Call your health care provider if any unusual symptoms occur within 6 weeks of receiving this vaccine. You may still catch the flu, but the illness is not usually as bad. You cannot get the flu from the vaccine. The vaccine will not protect against colds or other illnesses that may cause fever. The vaccine is needed every year. What side effects may I notice from receiving this medicine? Side effects that you should report to your doctor or health care professional as soon as possible: -allergic reactions like skin rash,  itching or hives, swelling of the face, lips, or tongue Side effects that usually do not require medical attention (report to your doctor or health care professional if they continue or are bothersome): -fever -headache -muscle aches and pains -pain, tenderness, redness, or swelling at site where injected -weak or tired This list may not describe all possible side effects. Call your doctor for medical advice about side effects. You may report side effects to FDA at 1-800-FDA-1088. Where should I keep my medicine? This vaccine is only given in a clinic, pharmacy, doctor's office, or other health care setting and will not be stored at home. NOTE: This sheet is a summary. It may not cover all possible information. If you have questions about this medicine, talk to your doctor, pharmacist, or health care provider.    2016, Elsevier/Gold Standard. (2008-04-15 09:30:40)

## 2016-08-14 ENCOUNTER — Ambulatory Visit (HOSPITAL_COMMUNITY): Payer: BLUE CROSS/BLUE SHIELD

## 2016-08-15 ENCOUNTER — Encounter (HOSPITAL_BASED_OUTPATIENT_CLINIC_OR_DEPARTMENT_OTHER): Payer: BLUE CROSS/BLUE SHIELD

## 2016-08-15 VITALS — BP 122/78 | HR 82 | Temp 97.8°F | Resp 20

## 2016-08-15 DIAGNOSIS — E538 Deficiency of other specified B group vitamins: Secondary | ICD-10-CM

## 2016-08-15 DIAGNOSIS — D509 Iron deficiency anemia, unspecified: Secondary | ICD-10-CM

## 2016-08-15 MED ORDER — SODIUM CHLORIDE 0.9 % IV SOLN
Freq: Once | INTRAVENOUS | Status: AC
  Administered 2016-08-15: 15:00:00 via INTRAVENOUS

## 2016-08-15 MED ORDER — SODIUM CHLORIDE 0.9 % IV SOLN
510.0000 mg | Freq: Once | INTRAVENOUS | Status: AC
  Administered 2016-08-15: 510 mg via INTRAVENOUS
  Filled 2016-08-15: qty 17

## 2016-08-15 NOTE — Patient Instructions (Signed)
[facility] at [facility] Discharge Instructions  RECOMMENDATIONS MADE BY THE CONSULTANT AND ANY TEST RESULTS WILL BE SENT TO YOUR REFERRING PHYSICIAN.  Received Feraheme today. Follow-up as scheduled. Call clinic for any questions or concerns  Thank you for choosing [facility] at [facility] to provide your oncology and hematology care.  To afford each patient quality time with our provider, please arrive at least 15 minutes before your scheduled appointment time.   Beginning January 23rd 2017 lab work for the Cancer Center will be done in the  Main lab at [place] on 1st floor. If you have a lab appointment with the Cancer Center please come in thru the  Main Entrance and check in at the main information desk  You need to re-schedule your appointment should you arrive 10 or more minutes late.  We strive to give you quality time with our providers, and arriving late affects you and other patients whose appointments are after yours.  Also, if you no show three or more times for appointments you may be dismissed from the clinic at the providers discretion.     Again, thank you for choosing [facility].  Our hope is that these requests will decrease the amount of time that you wait before being seen by our physicians.       _____________________________________________________________  Should you have questions after your visit to  Cancer Center, please contact our office at [phone] between the hours of 8:30 a.m. and 4:30 p.m.  Voicemails left after 4:30 p.m. will not be returned until the following business day.  For prescription refill requests, have your pharmacy contact our office.         Resources For Cancer Patients and their Caregivers ? American Cancer Society: Can assist with transportation, wigs, general needs, runs Look Good Feel Better.        1-888-227-6333 ? Cancer Care: Provides financial  assistance, online support groups, medication/co-pay assistance.  1-800-813-HOPE (4673) ? [facility] Assists [place] cancer patients and their families through emotional , educational and financial support.  336-427-4357 ? [place] DSS Where to apply for food stamps, Medicaid and utility assistance. 336-342-1394 ? RCATS: Transportation to medical appointments. 336-347-2287 ? Social Security Administration: May apply for disability if have a Stage IV cancer. 336-342-7796 1-800-772-1213 ? [place] Aging, Disability and Transit Services: Assists with nutrition, care and transit needs. 336-349-2343  Cancer Center Support Programs: @10RELATIVEDAYS@ > Cancer Support Group  2nd Tuesday of the month 1pm-2pm, Journey Room  > Creative Journey  3rd Tuesday of the month 1130am-1pm, Journey Room  > Look Good Feel Better  1st Wednesday of the month 10am-12 noon, Journey Room (Call American Cancer Society to register 1-800-395-5775)   

## 2016-08-15 NOTE — Progress Notes (Signed)
[name] tolerated Feraheme infusion well without complaints or incident VSS upon discharge. Pt discharged self ambulatory in satisfactory condition with husband

## 2016-08-17 MED ORDER — ACETAMINOPHEN 325 MG PO TABS
ORAL_TABLET | ORAL | Status: AC
  Filled 2016-08-17: qty 2

## 2016-08-17 MED ORDER — DIPHENHYDRAMINE HCL 25 MG PO CAPS
ORAL_CAPSULE | ORAL | Status: AC
  Filled 2016-08-17: qty 2

## 2016-08-21 ENCOUNTER — Telehealth (INDEPENDENT_AMBULATORY_CARE_PROVIDER_SITE_OTHER): Payer: Self-pay | Admitting: Internal Medicine

## 2016-08-21 NOTE — Telephone Encounter (Signed)
Patient called, stated that she was supposed to call and let [name] know how she is doing.  She stated that she is still having pain in the top of her stomach.  [phone]

## 2016-08-23 NOTE — Telephone Encounter (Signed)
Per Dr.Rehman will precede with EGD as is noted in last OV note 07/18/2016. Patient will have conscious sedation. Patient has not been called back, waiting for the procedure to be arranged.

## 2016-08-27 ENCOUNTER — Encounter (HOSPITAL_COMMUNITY): Payer: Self-pay | Admitting: Hematology & Oncology

## 2016-08-28 ENCOUNTER — Other Ambulatory Visit (INDEPENDENT_AMBULATORY_CARE_PROVIDER_SITE_OTHER): Payer: Self-pay | Admitting: *Deleted

## 2016-08-28 ENCOUNTER — Encounter (INDEPENDENT_AMBULATORY_CARE_PROVIDER_SITE_OTHER): Payer: Self-pay | Admitting: *Deleted

## 2016-08-28 DIAGNOSIS — G8929 Other chronic pain: Secondary | ICD-10-CM

## 2016-08-28 DIAGNOSIS — R1013 Epigastric pain: Principal | ICD-10-CM

## 2016-08-28 NOTE — Telephone Encounter (Signed)
Patient wants to know if she should continue to take medicine Dr Laural Golden gave her, EGD sch'd 09/22/16

## 2016-08-28 NOTE — Telephone Encounter (Signed)
This will be addressed with Dr.Rehman.  Then , the patient will be called with his recommendation.

## 2016-08-30 NOTE — Telephone Encounter (Signed)
Per Dr.Rehman if the patient feels that the medication is not helping her she may discontinue it. Patient was called and a message was left on her voicemail with this answer to her previous question.

## 2016-09-01 ENCOUNTER — Ambulatory Visit (HOSPITAL_COMMUNITY): Payer: BLUE CROSS/BLUE SHIELD

## 2016-09-19 ENCOUNTER — Encounter (HOSPITAL_COMMUNITY): Payer: BLUE CROSS/BLUE SHIELD | Attending: Hematology & Oncology

## 2016-09-19 VITALS — BP 132/83 | HR 85 | Temp 98.4°F | Resp 18

## 2016-09-19 DIAGNOSIS — E538 Deficiency of other specified B group vitamins: Secondary | ICD-10-CM | POA: Insufficient documentation

## 2016-09-19 DIAGNOSIS — Z9884 Bariatric surgery status: Secondary | ICD-10-CM | POA: Insufficient documentation

## 2016-09-19 DIAGNOSIS — D509 Iron deficiency anemia, unspecified: Secondary | ICD-10-CM | POA: Insufficient documentation

## 2016-09-19 MED ORDER — CYANOCOBALAMIN 1000 MCG/ML IJ SOLN
1000.0000 ug | Freq: Once | INTRAMUSCULAR | Status: AC
  Administered 2016-09-19: 1000 ug via INTRAMUSCULAR

## 2016-09-19 MED ORDER — CYANOCOBALAMIN 1000 MCG/ML IJ SOLN
INTRAMUSCULAR | Status: AC
  Filled 2016-09-19: qty 1

## 2016-09-19 NOTE — Progress Notes (Signed)
Jody Stokes tolerated Vit B12 injection well without complaints or incident. VSS Pt discharged self ambulatory in satisfactory condition 

## 2016-09-19 NOTE — Patient Instructions (Signed)
[place] at [facility] Discharge Instructions  RECOMMENDATIONS MADE BY THE CONSULTANT AND ANY TEST RESULTS WILL BE SENT TO YOUR REFERRING PHYSICIAN.  Received Vit B12  Injection. Follow-up as scheduled. Call clinic for any questions or concerns  Thank you for choosing [place] at [facility] to provide your oncology and hematology care.  To afford each patient quality time with our provider, please arrive at least 15 minutes before your scheduled appointment time.   Beginning January 23rd 2017 lab work for the [facility] will be done in the  Main lab at [facility] on 1st floor. If you have a lab appointment with the [place] please come in thru the  Main Entrance and check in at the main information desk  You need to re-schedule your appointment should you arrive 10 or more minutes late.  We strive to give you quality time with our providers, and arriving late affects you and other patients whose appointments are after yours.  Also, if you no show three or more times for appointments you may be dismissed from the clinic at the providers discretion.     Again, thank you for choosing [facility].  Our hope is that these requests will decrease the amount of time that you wait before being seen by our physicians.       _____________________________________________________________  Should you have questions after your visit to [facility], please contact our office at [phone] between the hours of 8:30 a.m. and 4:30 p.m.  Voicemails left after 4:30 p.m. will not be returned until the following business day.  For prescription refill requests, have your pharmacy contact our office.         Resources For Cancer Patients and their Caregivers ? American Cancer Society: Can assist with transportation, wigs, general needs, runs Look Good Feel Better.        941-313-7176 ? Cancer Care: Provides financial  assistance, online support groups, medication/co-pay assistance.  1-800-813-HOPE 208-571-2215) ? [place] Assists [place] cancer patients and their families through emotional , educational and financial support.  (415) 493-6110 ? [place] DSS Where to apply for food stamps, Medicaid and utility assistance. (530)597-7091 ? RCATS: Transportation to medical appointments. (972)854-7023 ? Social Security Administration: May apply for disability if have a Stage IV cancer. 502-260-1579 713-066-9713 ? [employer], Disability and Transit Services: Assists with nutrition, care and transit needs. [place] Support Programs: @10RELATIVEDAYS @ > Cancer Support Group  2nd Tuesday of the month 1pm-2pm, Journey Room  > Creative Journey  3rd Tuesday of the month 1130am-1pm, Journey Room  > Look Good Feel Better  1st Wednesday of the month 10am-12 noon, Journey Room (Call [place] to register [phone])

## 2016-09-22 ENCOUNTER — Encounter (HOSPITAL_COMMUNITY): Payer: Self-pay | Admitting: *Deleted

## 2016-09-22 ENCOUNTER — Ambulatory Visit (HOSPITAL_COMMUNITY)
Admission: RE | Admit: 2016-09-22 | Discharge: 2016-09-22 | Disposition: A | Discharge status: Home / Self Care | Payer: BLUE CROSS/BLUE SHIELD | Source: Ambulatory Visit | Attending: Internal Medicine | Admitting: Internal Medicine

## 2016-09-22 ENCOUNTER — Encounter (HOSPITAL_COMMUNITY)
Admission: RE | Disposition: A | Discharge status: Home / Self Care | Payer: Self-pay | Source: Ambulatory Visit | Attending: Internal Medicine

## 2016-09-22 DIAGNOSIS — G4730 Sleep apnea, unspecified: Secondary | ICD-10-CM | POA: Insufficient documentation

## 2016-09-22 DIAGNOSIS — R1013 Epigastric pain: Secondary | ICD-10-CM | POA: Insufficient documentation

## 2016-09-22 DIAGNOSIS — K228 Other specified diseases of esophagus: Secondary | ICD-10-CM | POA: Diagnosis not present

## 2016-09-22 DIAGNOSIS — K6389 Other specified diseases of intestine: Secondary | ICD-10-CM | POA: Diagnosis not present

## 2016-09-22 DIAGNOSIS — Z980 Intestinal bypass and anastomosis status: Secondary | ICD-10-CM | POA: Insufficient documentation

## 2016-09-22 DIAGNOSIS — Z79899 Other long term (current) drug therapy: Secondary | ICD-10-CM | POA: Insufficient documentation

## 2016-09-22 DIAGNOSIS — Z86711 Personal history of pulmonary embolism: Secondary | ICD-10-CM | POA: Insufficient documentation

## 2016-09-22 DIAGNOSIS — R110 Nausea: Secondary | ICD-10-CM | POA: Insufficient documentation

## 2016-09-22 DIAGNOSIS — E538 Deficiency of other specified B group vitamins: Secondary | ICD-10-CM | POA: Insufficient documentation

## 2016-09-22 DIAGNOSIS — K319 Disease of stomach and duodenum, unspecified: Secondary | ICD-10-CM | POA: Diagnosis not present

## 2016-09-22 DIAGNOSIS — G8929 Other chronic pain: Secondary | ICD-10-CM

## 2016-09-22 DIAGNOSIS — Z9884 Bariatric surgery status: Secondary | ICD-10-CM | POA: Insufficient documentation

## 2016-09-22 HISTORY — PX: ESOPHAGOGASTRODUODENOSCOPY: SHX5428

## 2016-09-22 HISTORY — PX: BIOPSY: SHX5522

## 2016-09-22 SURGERY — EGD (ESOPHAGOGASTRODUODENOSCOPY)
Anesthesia: Moderate Sedation

## 2016-09-22 MED ORDER — MIDAZOLAM HCL 5 MG/5ML IJ SOLN
INTRAMUSCULAR | Status: DC | PRN
  Administered 2016-09-22: 2 mg via INTRAVENOUS
  Administered 2016-09-22: 3 mg via INTRAVENOUS
  Administered 2016-09-22: 2 mg via INTRAVENOUS

## 2016-09-22 MED ORDER — SODIUM CHLORIDE 0.9 % IV SOLN
INTRAVENOUS | Status: DC
  Administered 2016-09-22: 1000 mL via INTRAVENOUS

## 2016-09-22 MED ORDER — MIDAZOLAM HCL 5 MG/5ML IJ SOLN
INTRAMUSCULAR | Status: AC
  Filled 2016-09-22: qty 10

## 2016-09-22 MED ORDER — STERILE WATER FOR IRRIGATION IR SOLN
Status: DC | PRN
  Administered 2016-09-22: 13:00:00

## 2016-09-22 MED ORDER — MEPERIDINE HCL 50 MG/ML IJ SOLN
INTRAMUSCULAR | Status: DC | PRN
  Administered 2016-09-22 (×2): 25 mg via INTRAVENOUS

## 2016-09-22 MED ORDER — MEPERIDINE HCL 50 MG/ML IJ SOLN
INTRAMUSCULAR | Status: AC
  Filled 2016-09-22: qty 1

## 2016-09-22 NOTE — Discharge Instructions (Signed)
Resume usual medications and diet. Remember to eat multiple small meals daily. No driving for 24 hours. Physician will call with biopsy results.  Esophagogastroduodenoscopy, Care After Introduction Refer to this sheet in the next few weeks. These instructions provide you with information about caring for yourself after your procedure. Your health care provider may also give you more specific instructions. Your treatment has been planned according to current medical practices, but problems sometimes occur. Call your health care provider if you have any problems or questions after your procedure. What can I expect after the procedure? After the procedure, it is common to have:  A sore throat.  Nausea.  Bloating.  Dizziness.  Fatigue. Follow these instructions at home:  Do not eat or drink anything until the numbing medicine (local anesthetic) has worn off and your gag reflex has returned. You will know that the local anesthetic has worn off when you can swallow comfortably.  Do not drive for 24 hours if you received a medicine to help you relax (sedative).  If your health care provider took a tissue sample for testing during the procedure, make sure to get your test results. This is your responsibility. Ask your health care provider or the department performing the test when your results will be ready.  Keep all follow-up visits as told by your health care provider. This is important. Contact a health care provider if:  You cannot stop coughing.  You are not urinating.  You are urinating less than usual. Get help right away if:  You have trouble swallowing.  You cannot eat or drink.  You have throat or chest pain that gets worse.  You are dizzy or light-headed.  You faint.  You have nausea or vomiting.  You have chills.  You have a fever.  You have severe abdominal pain.  You have black, tarry, or bloody stools. This information is not intended to replace advice  given to you by your health care provider. Make sure you discuss any questions you have with your health care provider. Document Released: 09/04/2012 Document Revised: 02/24/2016 Document Reviewed: 08/12/2015  2017 Elsevier   Moderate Conscious Sedation, Adult, Care After These instructions provide you with information about caring for yourself after your procedure. Your health care provider may also give you more specific instructions. Your treatment has been planned according to current medical practices, but problems sometimes occur. Call your health care provider if you have any problems or questions after your procedure. What can I expect after the procedure? After your procedure, it is common: To feel sleepy for several hours. To feel clumsy and have poor balance for several hours. To have poor judgment for several hours. To vomit if you eat too soon. Follow these instructions at home: For at least 24 hours after the procedure:  Do not: Participate in activities where you could fall or become injured. Drive. Use heavy machinery. Drink alcohol. Take sleeping pills or medicines that cause drowsiness. Make important decisions or sign legal documents. Take care of children on your own. Rest. Eating and drinking Follow the diet recommended by your health care provider. If you vomit: Drink water, juice, or soup when you can drink without vomiting. Make sure you have little or no nausea before eating solid foods. General instructions Have a responsible adult stay with you until you are awake and alert. Take over-the-counter and prescription medicines only as told by your health care provider. If you smoke, do not smoke without supervision. Keep all follow-up visits  as told by your health care provider. This is important. Contact a health care provider if: You keep feeling nauseous or you keep vomiting. You feel light-headed. You develop a rash. You have a fever. Get help right  away if: You have trouble breathing. This information is not intended to replace advice given to you by your health care provider. Make sure you discuss any questions you have with your health care provider. Document Released: 07/09/2013 Document Revised: 02/21/2016 Document Reviewed: 01/08/2016 Elsevier Interactive Patient Education  2017 [employer].

## 2016-09-22 NOTE — H&P (Signed)
Jody Stokes is an 55 y.o. female.   Chief Complaint: Patient is here for EGD. HPI: Patient is 55 year old African-American female who presents with epigastric pain which started over 6 months ago. Pain gets worse after meals. She has nausea but no vomiting. She also does not have good appetite. She was seen in the office over 2 months ago and begun on pantoprazole. She was also placed quit one of her NSAIDs. She is also given dicyclomine. She took pantoprazole for 1 month but did not sign difference and stopped this medication. She denies melena or rectal bleeding. Pain is now constant but worse after meals.  Past Medical History:  Diagnosis Date  . Anemia   . B12 deficiency 03/14/2016  . Gastric bypass status for obesity 03/14/2016  . Iron deficiency anemia 03/14/2016  . Presence of IVC filter   . Pulmonary embolism ([place])   . Sleep apnea   . Tubular adenoma of colon     Past Surgical History:  Procedure Laterality Date  . BIOPSY N/A 11/19/2014   Procedure: BIOPSY;  Surgeon: Daneil Dolin, MD;  Location: [facility];  Service: Endoscopy;  Laterality: N/A;  Ascending Colon  . CARPAL TUNNEL RELEASE Bilateral   . CHOLECYSTECTOMY  2009  . COLONOSCOPY  07/2012   transverse colon 5 mm polyp removed  . COLONOSCOPY WITH PROPOFOL N/A 11/19/2014   RMR: mutilple colonic polyps removed as described above. Pancolonic diverticulosis(few).  Status post mucosal biopsy to assess for microscopic coliits. /   . FOOT SURGERY  2015   left  . GASTRIC BYPASS  2008  . HERNIA REPAIR    . HIATAL HERNIA REPAIR  2007  . POLYPECTOMY N/A 11/19/2014   Procedure: POLYPECTOMY;  Surgeon: Daneil Dolin, MD;  Location: [facility];  Service: Endoscopy;  Laterality: N/A;  ileocecal valve, Hepatic Flexure, Splenic Flexure, Descending Colon   . TUBAL LIGATION      Family History  Problem Relation Age of Onset  . HIV Sister   . Hypertension Brother   . Diabetes Brother   . Hypertension Brother   . Heart Problems Brother    . Hypertension Brother   . Colon cancer Neg Hx    Social History:  reports that she has never smoked. She has never used smokeless tobacco. She reports that she does not drink alcohol or use drugs.  Allergies:  Allergies  Allergen Reactions  . Viberzi [Eluxadoline] Other (See Comments)    Patient states that she has extreme abdominal cramping.    Medications Prior to Admission  Medication Sig Dispense Refill  . CALCIUM PO Take 1 tablet by mouth daily.    . Cyanocobalamin (VITAMIN B-12 IJ) Inject as directed every 30 (thirty) days.    [place] Kitchen dicyclomine (BENTYL) 10 MG capsule Take 20 mg by mouth 3 (three) times daily.   0  . dicyclomine (BENTYL) 20 MG tablet Take 1 tablet (20 mg total) by mouth 3 (three) times daily before meals. 90 tablet 5  . DULoxetine (CYMBALTA) 60 MG capsule Take 60 mg by mouth at bedtime.   0  . ibuprofen (ADVIL,MOTRIN) 200 MG tablet Take 400 mg by mouth every 6 (six) hours as needed for headache or moderate pain.    . Multiple Vitamin (MULTIVITAMIN WITH MINERALS) TABS tablet Take 1 tablet by mouth daily.    . promethazine (PHENERGAN) 25 MG tablet Take 1 tablet (25 mg total) by mouth every 8 (eight) hours as needed for nausea or vomiting. 30 tablet 0  .  zolpidem (AMBIEN) 5 MG tablet Take 5 mg by mouth at bedtime.   0  . pantoprazole (PROTONIX) 40 MG tablet Take 1 tablet (40 mg total) by mouth daily before breakfast. (Patient not taking: Reported on 09/19/2016) 30 tablet 2    No results found for this or any previous visit (from the past 48 hour(s)). No results found.  ROS  Blood pressure (!) 139/91, pulse 68, temperature 97.8 F (36.6 C), temperature source Oral, resp. rate (!) 28, height 5\' 6"  (1.676 m), weight 250 lb (113.4 kg), last menstrual period 03/13/2016, SpO2 98 %. Physical Exam  Constitutional: She appears well-developed and well-nourished.  HENT:  Mouth/Throat: Oropharynx is clear and moist.  Eyes: Conjunctivae are normal. No scleral icterus.   Neck: No thyromegaly present.  Cardiovascular: Normal rate, regular rhythm and normal heart sounds.   Respiratory: Effort normal and breath sounds normal.  GI:  Abdomen is full but soft and mild midepigastric tenderness. No organomegaly or masses.  Musculoskeletal: She exhibits no edema.  Lymphadenopathy:    She has no cervical adenopathy.  Neurological: She is alert.  Skin: Skin is warm and dry.     Assessment/Plan Chronic epigastric pain and nausea. Diagnostic EGD.  Hildred Laser, MD 09/22/2016, 12:50 PM

## 2016-09-22 NOTE — Op Note (Signed)
[facility] Patient Name: Jody Stokes Procedure Date: 09/22/2016 12:33 PM MRN: [id number] Date of Birth: 07-08-1961 Attending MD: Hildred Laser , MD CSN: [id number] Age: 55 Admit Type: Outpatient Procedure:                Upper GI endoscopy Indications:              Epigastric abdominal pain Providers:                Hildred Laser, MD, Otis Peak B. Sharon Seller, RN, Randa Spike, Technician, Aram Candela Referring MD:             Frederick Peers, PA Medicines:                Cetacaine spray, Meperidine 50 mg IV, Midazolam 7                            mg IV Complications:            No immediate complications. Estimated Blood Loss:     Estimated blood loss was minimal. Procedure:                Pre-Anesthesia Assessment:                           - Prior to the procedure, a History and Physical                            was performed, and patient medications and                            allergies were reviewed. The patient's tolerance of                            previous anesthesia was also reviewed. The risks                            and benefits of the procedure and the sedation                            options and risks were discussed with the patient.                            All questions were answered, and informed consent                            was obtained. Prior Anticoagulants: The patient                            last took ibuprofen 7 days prior to the procedure.                            ASA Grade Assessment: II - A patient with mild  systemic disease. After reviewing the risks and                            benefits, the patient was deemed in satisfactory                            condition to undergo the procedure.                           After obtaining informed consent, the endoscope was                            passed under direct vision. Throughout the                            procedure,  the patient's blood pressure, pulse, and                            oxygen saturations were monitored continuously. The                            [id number] scope was introduced through the                            mouth, and advanced to the mid-jejunum. The upper                            GI endoscopy was accomplished without difficulty.                            The patient tolerated the procedure well. Scope In: 1:03:59 PM Scope Out: 1:13:40 PM Total Procedure Duration: 0 hours 9 minutes 41 seconds  Findings:      The examined esophagus was normal.      The Z-line was irregular and was found 40 cm from the incisors with       focal erythema.      Evidence of a Roux-en-Y gastrojejunostomy was found. This was traversed.      Very short gastric with normal mucosa.      The examined jejunum was normal. efferent loop normal to 90 cm from the       incisors. Focal edema noted to blind afferent loop.biopsy taken. Impression:               - Normal esophagus.                           - Z-line irregular, 40 cm from the incisors with                            focal erythema.                           - Roux-en-Y gastrojejunostomy.                           - Very small gastric pouch with normal  mucosa and                            patent gastrojejunal anastomosis without ulceration.                           - The examined jejunum was normal. efferent loop                            normal to 90 cm from the incisors. Focal edema                            noted to blind afferent loop. Biopsy taken.                           - No specimens collected. Moderate Sedation:      Moderate (conscious) sedation was administered by the endoscopy nurse       and supervised by the endoscopist. The following parameters were       monitored: oxygen saturation, heart rate, blood pressure, CO2       capnography and response to care. Total physician intraservice time was       17  minutes. Recommendation:           - Patient has a contact number available for                            emergencies. The signs and symptoms of potential                            delayed complications were discussed with the                            patient. Return to normal activities tomorrow.                            Written discharge instructions were provided to the                            patient.                           - Resume previous diet today.                           - Continue present medications.                           - Await pathology results. Procedure Code(s):        --- Professional ---                           [phone], Esophagogastroduodenoscopy, flexible,                            transoral; diagnostic, including collection of  specimen(s) by brushing or washing, when performed                            (separate procedure)                           99152, Moderate sedation services provided by the                            same physician or other qualified health care                            professional performing the diagnostic or                            therapeutic service that the sedation supports,                            requiring the presence of an independent trained                            observer to assist in the monitoring of the                            patient's level of consciousness and physiological                            status; initial 15 minutes of intraservice time,                            patient age 12 years or older Diagnosis Code(s):        --- Professional ---                           K22.8, Other specified diseases of esophagus                           Z98.0, Intestinal bypass and anastomosis status                           R10.13, Epigastric pain CPT copyright 2016 American Medical Association. All rights reserved. The codes documented in this report are preliminary and  upon coder review may  be revised to meet current compliance requirements. Hildred Laser, MD Hildred Laser, MD 09/22/2016 1:30:14 PM This report has been signed electronically. Number of Addenda: 0

## 2016-09-27 ENCOUNTER — Encounter (HOSPITAL_COMMUNITY): Payer: Self-pay | Admitting: Internal Medicine

## 2016-09-29 ENCOUNTER — Other Ambulatory Visit (INDEPENDENT_AMBULATORY_CARE_PROVIDER_SITE_OTHER): Payer: Self-pay | Admitting: Internal Medicine

## 2016-09-29 MED ORDER — MONTELUKAST SODIUM 10 MG PO TABS: 10.0000 mg | ORAL_TABLET | Freq: Every day | ORAL | 1 refills | Status: DC

## 2016-10-03 ENCOUNTER — Ambulatory Visit (HOSPITAL_COMMUNITY): Payer: BLUE CROSS/BLUE SHIELD

## 2016-10-05 ENCOUNTER — Other Ambulatory Visit (INDEPENDENT_AMBULATORY_CARE_PROVIDER_SITE_OTHER): Payer: Self-pay | Admitting: Internal Medicine

## 2016-10-05 DIAGNOSIS — R1013 Epigastric pain: Secondary | ICD-10-CM

## 2016-10-09 DIAGNOSIS — Z1231 Encounter for screening mammogram for malignant neoplasm of breast: Secondary | ICD-10-CM | POA: Diagnosis not present

## 2016-10-11 ENCOUNTER — Other Ambulatory Visit (INDEPENDENT_AMBULATORY_CARE_PROVIDER_SITE_OTHER): Payer: Self-pay | Admitting: Internal Medicine

## 2016-10-13 ENCOUNTER — Ambulatory Visit (HOSPITAL_COMMUNITY)
Admission: RE | Admit: 2016-10-13 | Discharge: 2016-10-13 | Disposition: A | Discharge status: Home / Self Care | Payer: BLUE CROSS/BLUE SHIELD | Source: Ambulatory Visit | Attending: Internal Medicine | Admitting: Internal Medicine

## 2016-10-13 DIAGNOSIS — R1013 Epigastric pain: Secondary | ICD-10-CM | POA: Insufficient documentation

## 2016-10-13 DIAGNOSIS — Z9884 Bariatric surgery status: Secondary | ICD-10-CM | POA: Insufficient documentation

## 2016-10-13 MED ORDER — IOPAMIDOL (ISOVUE-300) INJECTION 61%
100.0000 mL | Freq: Once | INTRAVENOUS | Status: AC | PRN
  Administered 2016-10-13: 100 mL via INTRAVENOUS

## 2016-10-17 ENCOUNTER — Ambulatory Visit (INDEPENDENT_AMBULATORY_CARE_PROVIDER_SITE_OTHER): Payer: BLUE CROSS/BLUE SHIELD | Admitting: Internal Medicine

## 2016-10-19 ENCOUNTER — Ambulatory Visit (HOSPITAL_COMMUNITY): Payer: BLUE CROSS/BLUE SHIELD

## 2016-11-03 ENCOUNTER — Encounter (HOSPITAL_COMMUNITY): Payer: BLUE CROSS/BLUE SHIELD

## 2016-11-03 ENCOUNTER — Encounter (HOSPITAL_COMMUNITY): Payer: BLUE CROSS/BLUE SHIELD | Attending: Oncology

## 2016-11-03 ENCOUNTER — Encounter (HOSPITAL_COMMUNITY): Payer: Self-pay

## 2016-11-03 VITALS — BP 125/73 | HR 77 | Temp 98.2°F | Resp 18

## 2016-11-03 DIAGNOSIS — E538 Deficiency of other specified B group vitamins: Secondary | ICD-10-CM

## 2016-11-03 DIAGNOSIS — D509 Iron deficiency anemia, unspecified: Secondary | ICD-10-CM | POA: Insufficient documentation

## 2016-11-03 LAB — FERRITIN: FERRITIN: 186 ng/mL (ref 11–307)

## 2016-11-03 LAB — CBC WITH DIFFERENTIAL/PLATELET
BASOS ABS: 0 10*3/uL (ref 0.0–0.1)
BASOS PCT: 0 %
Eosinophils Absolute: 0.2 10*3/uL (ref 0.0–0.7)
Eosinophils Relative: 3 %
HEMATOCRIT: 42.2 % (ref 36.0–46.0)
HEMOGLOBIN: 13.9 g/dL (ref 12.0–15.0)
Lymphocytes Relative: 28 %
Lymphs Abs: 2 10*3/uL (ref 0.7–4.0)
MCH: 30 pg (ref 26.0–34.0)
MCHC: 32.9 g/dL (ref 30.0–36.0)
MCV: 91.1 fL (ref 78.0–100.0)
Monocytes Absolute: 0.5 10*3/uL (ref 0.1–1.0)
Monocytes Relative: 8 %
NEUTROS ABS: 4.4 10*3/uL (ref 1.7–7.7)
NEUTROS PCT: 61 %
Platelets: 208 10*3/uL (ref 150–400)
RBC: 4.63 MIL/uL (ref 3.87–5.11)
RDW: 14.7 % (ref 11.5–15.5)
WBC: 7.1 10*3/uL (ref 4.0–10.5)

## 2016-11-03 MED ORDER — CYANOCOBALAMIN 1000 MCG/ML IJ SOLN
1000.0000 ug | Freq: Once | INTRAMUSCULAR | Status: AC
  Administered 2016-11-03: 1000 ug via INTRAMUSCULAR

## 2016-11-03 MED ORDER — CYANOCOBALAMIN 1000 MCG/ML IJ SOLN
INTRAMUSCULAR | Status: AC
  Filled 2016-11-03: qty 1

## 2016-11-03 NOTE — Patient Instructions (Signed)
[facility] at [facility] Discharge Instructions  RECOMMENDATIONS MADE BY THE CONSULTANT AND ANY TEST RESULTS WILL BE SENT TO YOUR REFERRING PHYSICIAN.  B12 injection today. Return as scheduled.   Thank you for choosing [facility] at [facility] to provide your oncology and hematology care.  To afford each patient quality time with our provider, please arrive at least 15 minutes before your scheduled appointment time.    If you have a lab appointment with the Cancer Center please come in thru the  Main Entrance and check in at the main information desk  You need to re-schedule your appointment should you arrive 10 or more minutes late.  We strive to give you quality time with our providers, and arriving late affects you and other patients whose appointments are after yours.  Also, if you no show three or more times for appointments you may be dismissed from the clinic at the providers discretion.     Again, thank you for choosing [facility].  Our hope is that these requests will decrease the amount of time that you wait before being seen by our physicians.       _____________________________________________________________  Should you have questions after your visit to  Cancer Center, please contact our office at [phone] between the hours of 8:30 a.m. and 4:30 p.m.  Voicemails left after 4:30 p.m. will not be returned until the following business day.  For prescription refill requests, have your pharmacy contact our office.       Resources For Cancer Patients and their Caregivers ? American Cancer Society: Can assist with transportation, wigs, general needs, runs Look Good Feel Better.        1-888-227-6333 ? Cancer Care: Provides financial assistance, online support groups, medication/co-pay assistance.  1-800-813-HOPE (4673) ? [facility] Assists [place] cancer patients and their  families through emotional , educational and financial support.  336-427-4357 ? [place] DSS Where to apply for food stamps, Medicaid and utility assistance. 336-342-1394 ? [employer]: Transportation to medical appointments. 336-347-2287 ? Social Security Administration: May apply for disability if have a Stage IV cancer. 336-342-7796 1-800-772-1213 ? [place] Aging, Disability and Transit Services: Assists with nutrition, care and transit needs. 336-349-2343  Cancer Center Support Programs: @[email or web address]@ > Cancer Support Group  2nd Tuesday of the month 1pm-2pm, Journey Room  > Creative Journey  3rd Tuesday of the month 1130am-1pm, Journey Room  > Look Good Feel Better  1st Wednesday of the month 10am-12 noon, Journey Room (Call American Cancer [employer] to register 1-800-395-5775)   

## 2016-11-03 NOTE — Progress Notes (Signed)
Lenis Dickinson presents today for injection per the provider's orders.  B12 administration without incident; see MAR for injection details.  Patient tolerated procedure well and without incident.  No questions or complaints noted at this time.

## 2016-11-20 ENCOUNTER — Ambulatory Visit (HOSPITAL_COMMUNITY): Payer: BLUE CROSS/BLUE SHIELD

## 2016-12-01 ENCOUNTER — Ambulatory Visit (HOSPITAL_COMMUNITY): Payer: BLUE CROSS/BLUE SHIELD

## 2016-12-01 ENCOUNTER — Other Ambulatory Visit (INDEPENDENT_AMBULATORY_CARE_PROVIDER_SITE_OTHER): Payer: Self-pay | Admitting: Internal Medicine

## 2016-12-05 ENCOUNTER — Encounter (HOSPITAL_COMMUNITY): Payer: BLUE CROSS/BLUE SHIELD | Attending: Hematology & Oncology

## 2016-12-05 ENCOUNTER — Encounter (HOSPITAL_COMMUNITY): Payer: Self-pay

## 2016-12-05 VITALS — BP 127/86 | HR 92 | Temp 98.1°F | Resp 18

## 2016-12-05 DIAGNOSIS — M25542 Pain in joints of left hand: Secondary | ICD-10-CM | POA: Diagnosis not present

## 2016-12-05 DIAGNOSIS — G4700 Insomnia, unspecified: Secondary | ICD-10-CM | POA: Diagnosis not present

## 2016-12-05 DIAGNOSIS — E538 Deficiency of other specified B group vitamins: Secondary | ICD-10-CM | POA: Diagnosis not present

## 2016-12-05 DIAGNOSIS — Z6841 Body Mass Index (BMI) 40.0 and over, adult: Secondary | ICD-10-CM | POA: Diagnosis not present

## 2016-12-05 DIAGNOSIS — D509 Iron deficiency anemia, unspecified: Secondary | ICD-10-CM | POA: Insufficient documentation

## 2016-12-05 DIAGNOSIS — M25541 Pain in joints of right hand: Secondary | ICD-10-CM | POA: Diagnosis not present

## 2016-12-05 DIAGNOSIS — Z9884 Bariatric surgery status: Secondary | ICD-10-CM | POA: Insufficient documentation

## 2016-12-05 DIAGNOSIS — M130 Polyarthritis, unspecified: Secondary | ICD-10-CM | POA: Diagnosis not present

## 2016-12-05 MED ORDER — CYANOCOBALAMIN 1000 MCG/ML IJ SOLN
1000.0000 ug | Freq: Once | INTRAMUSCULAR | Status: AC
  Administered 2016-12-05: 1000 ug via INTRAMUSCULAR

## 2016-12-05 MED ORDER — CYANOCOBALAMIN 1000 MCG/ML IJ SOLN
INTRAMUSCULAR | Status: AC
  Filled 2016-12-05: qty 1

## 2016-12-05 NOTE — Patient Instructions (Signed)
[facility] at [facility] Discharge Instructions  RECOMMENDATIONS MADE BY THE CONSULTANT AND ANY TEST RESULTS WILL BE SENT TO YOUR REFERRING PHYSICIAN.  B12 injection today. Return as scheduled.   Thank you for choosing [facility] at [facility] to provide your oncology and hematology care.  To afford each patient quality time with our provider, please arrive at least 15 minutes before your scheduled appointment time.    If you have a lab appointment with the Cancer Center please come in thru the  Main Entrance and check in at the main information desk  You need to re-schedule your appointment should you arrive 10 or more minutes late.  We strive to give you quality time with our providers, and arriving late affects you and other patients whose appointments are after yours.  Also, if you no show three or more times for appointments you may be dismissed from the clinic at the providers discretion.     Again, thank you for choosing [facility].  Our hope is that these requests will decrease the amount of time that you wait before being seen by our physicians.       _____________________________________________________________  Should you have questions after your visit to [facility], please contact our office at [phone] between the hours of 8:30 a.m. and 4:30 p.m.  Voicemails left after 4:30 p.m. will not be returned until the following business day.  For prescription refill requests, have your pharmacy contact our office.       Resources For Cancer Patients and their Caregivers ? American Cancer Society: Can assist with transportation, wigs, general needs, runs Look Good Feel Better.        1-888-227-6333 ? Cancer Care: Provides financial assistance, online support groups, medication/co-pay assistance.  1-800-813-HOPE (4673) ? [facility] Assists [place] cancer patients and their  families through emotional , educational and financial support.  336-427-4357 ? [place] DSS Where to apply for food stamps, Medicaid and utility assistance. 336-342-1394 ? [employer]: Transportation to medical appointments. 336-347-2287 ? Social Security Administration: May apply for disability if have a Stage IV cancer. 336-342-7796 1-800-772-1213 ? [place] Aging, Disability and Transit Services: Assists with nutrition, care and transit needs. 336-349-2343  Cancer Center Support Programs: @[email or web address]@ > Cancer Support Group  2nd Tuesday of the month 1pm-2pm, Journey Room  > Creative Journey  3rd Tuesday of the month 1130am-1pm, Journey Room  > Look Good Feel Better  1st Wednesday of the month 10am-12 noon, Journey Room (Call American Cancer [employer] to register 1-800-395-5775)   

## 2016-12-05 NOTE — Progress Notes (Signed)
Jody Stokes presents today for injection per the provider's orders.  B12 administration without incident; see MAR for injection details.  Patient tolerated procedure well and without incident.  No questions or complaints noted at this time.

## 2016-12-22 DIAGNOSIS — G4730 Sleep apnea, unspecified: Secondary | ICD-10-CM | POA: Diagnosis not present

## 2016-12-22 DIAGNOSIS — G4710 Hypersomnia, unspecified: Secondary | ICD-10-CM | POA: Diagnosis not present

## 2016-12-27 DIAGNOSIS — M2550 Pain in unspecified joint: Secondary | ICD-10-CM | POA: Diagnosis not present

## 2016-12-27 DIAGNOSIS — M797 Fibromyalgia: Secondary | ICD-10-CM | POA: Diagnosis not present

## 2016-12-27 DIAGNOSIS — M7989 Other specified soft tissue disorders: Secondary | ICD-10-CM | POA: Diagnosis not present

## 2016-12-27 DIAGNOSIS — R5383 Other fatigue: Secondary | ICD-10-CM | POA: Diagnosis not present

## 2016-12-27 DIAGNOSIS — M1990 Unspecified osteoarthritis, unspecified site: Secondary | ICD-10-CM | POA: Diagnosis not present

## 2017-01-01 ENCOUNTER — Ambulatory Visit (HOSPITAL_COMMUNITY): Payer: BLUE CROSS/BLUE SHIELD

## 2017-01-09 ENCOUNTER — Encounter (INDEPENDENT_AMBULATORY_CARE_PROVIDER_SITE_OTHER): Payer: Self-pay

## 2017-01-10 ENCOUNTER — Encounter (INDEPENDENT_AMBULATORY_CARE_PROVIDER_SITE_OTHER): Payer: Self-pay

## 2017-01-15 ENCOUNTER — Ambulatory Visit: Payer: BLUE CROSS/BLUE SHIELD | Admitting: Nutrition

## 2017-01-28 ENCOUNTER — Other Ambulatory Visit (INDEPENDENT_AMBULATORY_CARE_PROVIDER_SITE_OTHER): Payer: Self-pay | Admitting: Internal Medicine

## 2017-01-30 ENCOUNTER — Other Ambulatory Visit (HOSPITAL_COMMUNITY): Payer: Self-pay | Admitting: *Deleted

## 2017-01-30 DIAGNOSIS — D508 Other iron deficiency anemias: Secondary | ICD-10-CM

## 2017-01-31 ENCOUNTER — Encounter (HOSPITAL_COMMUNITY): Payer: BLUE CROSS/BLUE SHIELD

## 2017-01-31 ENCOUNTER — Encounter (HOSPITAL_COMMUNITY): Payer: BLUE CROSS/BLUE SHIELD | Attending: Oncology | Admitting: Oncology

## 2017-01-31 ENCOUNTER — Encounter (HOSPITAL_COMMUNITY): Payer: Self-pay

## 2017-01-31 ENCOUNTER — Encounter (HOSPITAL_BASED_OUTPATIENT_CLINIC_OR_DEPARTMENT_OTHER): Payer: BLUE CROSS/BLUE SHIELD

## 2017-01-31 VITALS — BP 150/88 | HR 96 | Temp 98.1°F | Resp 18 | Wt 274.6 lb

## 2017-01-31 DIAGNOSIS — D509 Iron deficiency anemia, unspecified: Secondary | ICD-10-CM | POA: Diagnosis not present

## 2017-01-31 DIAGNOSIS — D508 Other iron deficiency anemias: Secondary | ICD-10-CM

## 2017-01-31 DIAGNOSIS — E538 Deficiency of other specified B group vitamins: Secondary | ICD-10-CM | POA: Diagnosis not present

## 2017-01-31 DIAGNOSIS — G5750 Tarsal tunnel syndrome, unspecified lower limb: Secondary | ICD-10-CM | POA: Insufficient documentation

## 2017-01-31 DIAGNOSIS — Z9884 Bariatric surgery status: Secondary | ICD-10-CM

## 2017-01-31 DIAGNOSIS — K9089 Other intestinal malabsorption: Secondary | ICD-10-CM | POA: Diagnosis not present

## 2017-01-31 LAB — COMPREHENSIVE METABOLIC PANEL
ALBUMIN: 3.7 g/dL (ref 3.5–5.0)
ALT: 72 U/L — AB (ref 14–54)
AST: 46 U/L — AB (ref 15–41)
Alkaline Phosphatase: 98 U/L (ref 38–126)
Anion gap: 7 (ref 5–15)
BILIRUBIN TOTAL: 0.4 mg/dL (ref 0.3–1.2)
BUN: 17 mg/dL (ref 6–20)
CO2: 26 mmol/L (ref 22–32)
CREATININE: 0.82 mg/dL (ref 0.44–1.00)
Calcium: 8.8 mg/dL — ABNORMAL LOW (ref 8.9–10.3)
Chloride: 103 mmol/L (ref 101–111)
GFR calc Af Amer: >60 mL/min (ref >60)
GLUCOSE: 164 mg/dL — AB (ref 65–99)
POTASSIUM: 3.6 mmol/L (ref 3.5–5.1)
Sodium: 136 mmol/L (ref 135–145)
TOTAL PROTEIN: 7 g/dL (ref 6.5–8.1)

## 2017-01-31 LAB — CBC WITH DIFFERENTIAL/PLATELET
BASOS ABS: 0 10*3/uL (ref 0.0–0.1)
BASOS PCT: 0 %
Eosinophils Absolute: 0.1 10*3/uL (ref 0.0–0.7)
Eosinophils Relative: 1 %
HEMATOCRIT: 42.4 % (ref 36.0–46.0)
HEMOGLOBIN: 13.8 g/dL (ref 12.0–15.0)
LYMPHS PCT: 19 %
Lymphs Abs: 2.1 10*3/uL (ref 0.7–4.0)
MCH: 30.7 pg (ref 26.0–34.0)
MCHC: 32.5 g/dL (ref 30.0–36.0)
MCV: 94.4 fL (ref 78.0–100.0)
Monocytes Absolute: 0.4 10*3/uL (ref 0.1–1.0)
Monocytes Relative: 4 %
NEUTROS ABS: 8.2 10*3/uL — AB (ref 1.7–7.7)
NEUTROS PCT: 76 %
Platelets: 226 10*3/uL (ref 150–400)
RBC: 4.49 MIL/uL (ref 3.87–5.11)
RDW: 15.3 % (ref 11.5–15.5)
WBC: 10.8 10*3/uL — AB (ref 4.0–10.5)

## 2017-01-31 MED ORDER — CYANOCOBALAMIN 1000 MCG/ML IJ SOLN
1000.0000 ug | Freq: Once | INTRAMUSCULAR | Status: AC
  Administered 2017-01-31: 1000 ug via INTRAMUSCULAR

## 2017-01-31 MED ORDER — CYANOCOBALAMIN 1000 MCG/ML IJ SOLN
INTRAMUSCULAR | Status: AC
  Filled 2017-01-31: qty 1

## 2017-01-31 NOTE — Patient Instructions (Signed)
[facility] at [facility] Discharge Instructions  RECOMMENDATIONS MADE BY THE CONSULTANT AND ANY TEST RESULTS WILL BE SENT TO YOUR REFERRING PHYSICIAN.  Received Vit B12 injection today. Follow-up as scheduled. Call clinic for any questions or concerns  Thank you for choosing [facility] at [facility] to provide your oncology and hematology care.  To afford each patient quality time with our provider, please arrive at least 15 minutes before your scheduled appointment time.    If you have a lab appointment with the Cancer Center please come in thru the  Main Entrance and check in at the main information desk  You need to re-schedule your appointment should you arrive 10 or more minutes late.  We strive to give you quality time with our providers, and arriving late affects you and other patients whose appointments are after yours.  Also, if you no show three or more times for appointments you may be dismissed from the clinic at the providers discretion.     Again, thank you for choosing [facility].  Our hope is that these requests will decrease the amount of time that you wait before being seen by our physicians.       _____________________________________________________________  Should you have questions after your visit to [facility], please contact our office at [phone] between the hours of 8:30 a.m. and 4:30 p.m.  Voicemails left after 4:30 p.m. will not be returned until the following business day.  For prescription refill requests, have your pharmacy contact our office.       Resources For Cancer Patients and their Caregivers ? American Cancer Society: Can assist with transportation, wigs, general needs, runs Look Good Feel Better.        1-888-227-6333 ? Cancer Care: Provides financial assistance, online support groups, medication/co-pay assistance.  1-800-813-HOPE (4673) ? [facility] Assists [place] cancer patients and their families through emotional , educational and financial support.  336-427-4357 ? [place] DSS Where to apply for food stamps, Medicaid and utility assistance. 336-342-1394 ? [employer]: Transportation to medical appointments. 336-347-2287 ? Social Security Administration: May apply for disability if have a Stage IV cancer. 336-342-7796 1-800-772-1213 ? [place] Aging, Disability and Transit Services: Assists with nutrition, care and transit needs. 336-349-2343  Cancer Center Support Programs: @[email or web address]@ > Cancer Support Group  2nd Tuesday of the month 1pm-2pm, Journey Room  > Creative Journey  3rd Tuesday of the month 1130am-1pm, Journey Room  > Look Good Feel Better  1st Wednesday of the month 10am-12 noon, Journey Room (Call [employer] to register 1-800-395-5775)   

## 2017-01-31 NOTE — Progress Notes (Signed)
Jody Stokes tolerated Vit B12 injection well without complaints or incident. Pt waiting in room for MD appt at this time
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
## 2017-01-31 NOTE — Patient Instructions (Addendum)
[place] at [facility] Discharge Instructions  RECOMMENDATIONS MADE BY THE CONSULTANT AND ANY TEST RESULTS WILL BE SENT TO YOUR REFERRING PHYSICIAN.  You were seen today by Dr. Twana First You got your B-12 injection today, continue to get it monthly Follow up in 4 months with lab work    Thank you for choosing [place] at [facility] to provide your oncology and hematology care.  To afford each patient quality time with our provider, please arrive at least 15 minutes before your scheduled appointment time.    If you have a lab appointment with the [facility] please come in thru the  Main Entrance and check in at the main information desk  You need to re-schedule your appointment should you arrive 10 or more minutes late.  We strive to give you quality time with our providers, and arriving late affects you and other patients whose appointments are after yours.  Also, if you no show three or more times for appointments you may be dismissed from the clinic at the providers discretion.     Again, thank you for choosing [facility].  Our hope is that these requests will decrease the amount of time that you wait before being seen by our physicians.       _____________________________________________________________  Should you have questions after your visit to [facility], please contact our office at [phone] between the hours of 8:30 a.m. and 4:30 p.m.  Voicemails left after 4:30 p.m. will not be returned until the following business day.  For prescription refill requests, have your pharmacy contact our office.       Resources For Cancer Patients and their Caregivers ? American Cancer Society: Can assist with transportation, wigs, general needs, runs Look Good Feel Better.        727 152 4306 ? Cancer Care: Provides financial assistance, online support groups, medication/co-pay assistance.   1-800-813-HOPE (347)320-6825) ? [place] Assists [place] cancer patients and their families through emotional , educational and financial support.  213-421-8428 ? [place] DSS Where to apply for food stamps, Medicaid and utility assistance. (315)316-8389 ? [facility]: Transportation to medical appointments. 737-658-3271 ? Social Security Administration: May apply for disability if have a Stage IV cancer. 813-010-6231 541-556-5203 ? [employer], Disability and Transit Services: Assists with nutrition, care and transit needs. [place] Support Programs: @10RELATIVEDAYS @ > Cancer Support Group  2nd Tuesday of the month 1pm-2pm, Journey Room  > Creative Journey  3rd Tuesday of the month 1130am-1pm, Journey Room  > Look Good Feel Better  1st Wednesday of the month 10am-12 noon, Journey Room (Call [place] to register [phone])

## 2017-02-05 DIAGNOSIS — M069 Rheumatoid arthritis, unspecified: Secondary | ICD-10-CM | POA: Diagnosis not present

## 2017-02-05 DIAGNOSIS — Z6841 Body Mass Index (BMI) 40.0 and over, adult: Secondary | ICD-10-CM | POA: Diagnosis not present

## 2017-02-05 DIAGNOSIS — R5383 Other fatigue: Secondary | ICD-10-CM | POA: Diagnosis not present

## 2017-02-05 DIAGNOSIS — G4700 Insomnia, unspecified: Secondary | ICD-10-CM | POA: Diagnosis not present

## 2017-02-06 ENCOUNTER — Other Ambulatory Visit (INDEPENDENT_AMBULATORY_CARE_PROVIDER_SITE_OTHER): Payer: Self-pay | Admitting: *Deleted

## 2017-02-06 ENCOUNTER — Telehealth (INDEPENDENT_AMBULATORY_CARE_PROVIDER_SITE_OTHER): Payer: Self-pay | Admitting: Internal Medicine

## 2017-02-06 ENCOUNTER — Other Ambulatory Visit (INDEPENDENT_AMBULATORY_CARE_PROVIDER_SITE_OTHER): Payer: Self-pay | Admitting: Internal Medicine

## 2017-02-06 MED ORDER — DICYCLOMINE HCL 10 MG PO CAPS: 10.0000 mg | ORAL_CAPSULE | Freq: Three times a day (TID) | ORAL | 2 refills | Status: DC

## 2017-02-06 NOTE — Telephone Encounter (Signed)
This rx request was e-scribed to the patient's pharmacy earlier today, as instructed by Dr.Rehman.

## 2017-02-06 NOTE — Telephone Encounter (Signed)
Patient needs a OV, she has not been seen in [place] since 2017.

## 2017-02-06 NOTE — Telephone Encounter (Signed)
Patient called, stated she is almost out of her Dicyclomine.  She would like a refill called in to [facility] in [place].  [phone]

## 2017-02-09 DIAGNOSIS — M797 Fibromyalgia: Secondary | ICD-10-CM | POA: Diagnosis not present

## 2017-02-09 DIAGNOSIS — M0609 Rheumatoid arthritis without rheumatoid factor, multiple sites: Secondary | ICD-10-CM | POA: Diagnosis not present

## 2017-02-20 DIAGNOSIS — Z6841 Body Mass Index (BMI) 40.0 and over, adult: Secondary | ICD-10-CM | POA: Diagnosis not present

## 2017-02-20 DIAGNOSIS — M62830 Muscle spasm of back: Secondary | ICD-10-CM | POA: Diagnosis not present

## 2017-02-20 DIAGNOSIS — M545 Low back pain: Secondary | ICD-10-CM | POA: Diagnosis not present

## 2017-03-01 ENCOUNTER — Encounter (HOSPITAL_COMMUNITY): Payer: BLUE CROSS/BLUE SHIELD

## 2017-03-08 DIAGNOSIS — R002 Palpitations: Secondary | ICD-10-CM | POA: Diagnosis not present

## 2017-03-08 DIAGNOSIS — J9811 Atelectasis: Secondary | ICD-10-CM | POA: Diagnosis not present

## 2017-03-08 DIAGNOSIS — Z79899 Other long term (current) drug therapy: Secondary | ICD-10-CM | POA: Diagnosis not present

## 2017-03-08 LAB — PROTIME-INR

## 2017-03-09 DIAGNOSIS — R0600 Dyspnea, unspecified: Secondary | ICD-10-CM | POA: Diagnosis not present

## 2017-03-09 DIAGNOSIS — R002 Palpitations: Secondary | ICD-10-CM | POA: Diagnosis not present

## 2017-03-09 DIAGNOSIS — Z6841 Body Mass Index (BMI) 40.0 and over, adult: Secondary | ICD-10-CM | POA: Diagnosis not present

## 2017-03-14 NOTE — Progress Notes (Signed)
Cardiology Office Note   Date:  03/15/2017   ID:  Jody Stokes, DOB May 26, 1961, MRN [id number]  PCP:  Denny Levy, PA  Cardiologist:   Minus Breeding, MD  Referring:  Denny Levy, [place]  Chief Complaint  Patient presents with  . Palpitations      History of Present Illness: Jody Stokes is a 56 y.o. female who is referred by Denny Levy, PA for evaluation of palpitations.  She has history of congenital heart disease with surgery at 11 months.  She has not the details of this has not had follow-up with cardiologist as an adult. He reports a negative stress test in 2008 prior to bariatric surgery. 2 weeks ago she started noticing palpitations every day. She stopped caffeine and this did not seem to help. She describes rapid beats many in a row. It might seem to last all day. It's happening every day. She knows it when she is lying down or sitting. It might happen with activity. At night she puts her CPAP on and goes to sleep and she doesn't have these symptoms. She denies any presyncope though she does get dizzy with this. She's not had syncope. She's had some fleeting sharp chest pain. She is active at work working in [employer]. She cannot bring on the symptoms. Seems to happen unprovoked. She doesn't have any PND or orthopnea but she's had increasing dyspnea with exertion.     Past Medical History:  Diagnosis Date  . Anemia   . B12 deficiency 03/14/2016  . Congenital heart disease   . Depression   . Gastric bypass status for obesity 03/14/2016  . Iron deficiency anemia 03/14/2016  . Presence of IVC filter   . Pulmonary embolism ([place])   . Rheumatoid arthritis ([place])   . Sleep apnea   . Tubular adenoma of colon     Past Surgical History:  Procedure Laterality Date  . BIOPSY N/A 11/19/2014   Procedure: BIOPSY;  Surgeon: Daneil Dolin, MD;  Location: [facility] ORS;  Service: Endoscopy;  Laterality: N/A;  Ascending Colon  . BIOPSY  09/22/2016   Procedure: BIOPSY;  Surgeon: Rogene Houston, MD;  [facility]: [facility];  Service: Endoscopy;;  small bowel  . CARPAL TUNNEL RELEASE Bilateral   . CHOLECYSTECTOMY  2009  . COLONOSCOPY  07/2012   transverse colon 5 mm polyp removed  . COLONOSCOPY WITH PROPOFOL N/A 11/19/2014   RMR: mutilple colonic polyps removed as described above. Pancolonic diverticulosis(few).  Status post mucosal biopsy to assess for microscopic coliits. /   . ESOPHAGOGASTRODUODENOSCOPY N/A 09/22/2016   Procedure: ESOPHAGOGASTRODUODENOSCOPY (EGD);  Surgeon: Rogene Houston, MD;  Location: [facility];  Service: Endoscopy;  Laterality: N/A;  1:15  . FOOT SURGERY  2015   left  . GASTRIC BYPASS  2008  . HERNIA REPAIR    . HIATAL HERNIA REPAIR  2007  . POLYPECTOMY N/A 11/19/2014   Procedure: POLYPECTOMY;  Surgeon: Daneil Dolin, MD;  Location: [facility] ORS;  Service: Endoscopy;  Laterality: N/A;  ileocecal valve, Hepatic Flexure, Splenic Flexure, Descending Colon   . TUBAL LIGATION       Current Outpatient Prescriptions  Medication Sig Dispense Refill  . Cyanocobalamin (VITAMIN B-12 IJ) Inject as directed every 30 (thirty) days.    Marland Kitchen dicyclomine (BENTYL) 10 MG capsule Take 1 capsule (10 mg total) by mouth 3 (three) times daily before meals. Patient will take 2 two 10 mg capsules by mouth three times  a day before a meal. 180 capsule 2  . DULoxetine (CYMBALTA) 60 MG capsule Take 60 mg by mouth at bedtime.   0  . folic acid (FOLVITE) 1 MG tablet Take 1 mg by mouth daily.  1  . ibuprofen (ADVIL,MOTRIN) 200 MG tablet Take 400 mg by mouth every 6 (six) hours as needed for headache or moderate pain.    . methotrexate (RHEUMATREX) 2.5 MG tablet Take 15 mg by mouth once a week.  0  . metoprolol tartrate (LOPRESSOR) 25 MG tablet Take 25 mg by mouth daily.    . modafinil (PROVIGIL) 100 MG tablet Take 100 mg by mouth daily.    . montelukast (SINGULAIR) 10 MG tablet Take 10 mg by mouth at bedtime.    . ondansetron (ZOFRAN) 8 MG  tablet Take 8 mg by mouth every 8 (eight) hours as needed for nausea or vomiting.    Marland Kitchen zolpidem (AMBIEN) 5 MG tablet Take 5 mg by mouth at bedtime.   0   No current facility-administered medications for this visit.     Allergies:   Viberzi [eluxadoline]    Social History:  The patient  reports that she has never smoked. She has never used smokeless tobacco. She reports that she does not drink alcohol or use drugs.   Family History:  The patient's family history includes Diabetes in her brother; HIV in her sister; Heart Problems in her brother; Hypertension in her brother, brother, and brother; Kidney disease in her brother.    ROS:  Please see the history of present illness.   Otherwise, review of systems are positive for none.   All other systems are reviewed and negative.    PHYSICAL EXAM: VS:  BP (!) 142/90   Pulse 79   Ht 5\' 6"  (1.676 m)   Wt 270 lb (122.5 kg)   BMI 43.58 kg/m  , BMI Body mass index is 43.58 kg/m. GENERAL:  Well appearing HEENT:  Pupils equal round and reactive, fundi not visualized, oral mucosa unremarkable NECK:  No jugular venous distention, waveform within normal limits, carotid upstroke brisk and symmetric, no bruits, no thyromegaly LYMPHATICS:  No cervical, inguinal adenopathy LUNGS:  Clear to auscultation bilaterally BACK:  No CVA tenderness CHEST:  Well healed left thoracotomy scar. HEART:  PMI not displaced or sustained,S1 and S2 within normal limits, no S3, no S4, no clicks, no rubs, no murmurs ABD:  Flat, positive bowel sounds normal in frequency in pitch, no bruits, no rebound, no guarding, no midline pulsatile mass, no hepatomegaly, no splenomegaly EXT:  2 plus pulses throughout, no edema, no cyanosis no clubbing SKIN:  No rashes no nodules NEURO:  Cranial nerves II through XII grossly intact, motor grossly intact throughout PSYCH:  Cognitively intact, oriented to person place and time    EKG:  EKG is ordered today. Abnormal P wave, probable  ectopic atrial rhythm with premature atrial contractions, axis within normal limits, intervals within normal limits, no acute ST-T wave changes.   Recent Labs: 01/31/2017: ALT 72; BUN 17; Creatinine, Ser 0.82; Hemoglobin 13.8; Platelets 226; Potassium 3.6; Sodium 136    Lipid Panel No results found for: CHOL, TRIG, HDL, CHOLHDL, VLDL, LDLCALC, LDLDIRECT    Wt Readings from Last 3 Encounters:  03/15/17 270 lb (122.5 kg)  01/31/17 274 lb 9.6 oz (124.6 kg)  09/22/16 250 lb (113.4 kg)      Other studies Reviewed: Additional studies/ records that were reviewed today include: Office records and [facility] ED records. Marland Kitchen  Review of the above records demonstrates:  Please see elsewhere in the note.     ASSESSMENT AND PLAN:   PALPITATIONS:  I'm going to start with a TSH which I don't see in her records. I'll get an echocardiogram to understand her past congenital history any evidence of heart disease. She will have a 48 hour Holter monitor. Further evaluation will be based on these results  SOB:  I'll have a low threshold for a POET (Plain Old Exercise Treadmill)  HTN:  The blood pressure is mildly elevated . No change in medications is indicated. We will continue with therapeutic lifestyle changes (TLC).'  Current medicines are reviewed at length with the patient today.  The patient does not have concerns regarding medicines.  The following changes have been made:  no change  Labs/ tests ordered today include:   Orders Placed This Encounter  Procedures  . TSH  . Holter monitor - 48 hour  . EKG 12-Lead  . ECHOCARDIOGRAM COMPLETE     Disposition:   FU with me after the above studies.      Signed, Minus Breeding, MD  03/15/2017 9:35 AM    [facility]

## 2017-03-15 ENCOUNTER — Ambulatory Visit (INDEPENDENT_AMBULATORY_CARE_PROVIDER_SITE_OTHER): Payer: BLUE CROSS/BLUE SHIELD | Admitting: Cardiology

## 2017-03-15 ENCOUNTER — Encounter (INDEPENDENT_AMBULATORY_CARE_PROVIDER_SITE_OTHER): Payer: Self-pay

## 2017-03-15 ENCOUNTER — Encounter: Payer: Self-pay | Admitting: Cardiology

## 2017-03-15 VITALS — BP 142/90 | HR 79 | Ht 66.0 in | Wt 270.0 lb

## 2017-03-15 DIAGNOSIS — R002 Palpitations: Secondary | ICD-10-CM

## 2017-03-15 DIAGNOSIS — Q249 Congenital malformation of heart, unspecified: Secondary | ICD-10-CM

## 2017-03-15 DIAGNOSIS — R079 Chest pain, unspecified: Secondary | ICD-10-CM

## 2017-03-15 LAB — TSH: TSH: 1.25 u[IU]/mL (ref 0.450–4.500)

## 2017-03-15 NOTE — Patient Instructions (Signed)
Medication Instructions:  Continue current medications  Labwork: TSH  Testing/Procedures: Your physician has recommended that you wear a holter monitor. Holter monitors are medical devices that record the heart's electrical activity. Doctors most often use these monitors to diagnose arrhythmias. Arrhythmias are problems with the speed or rhythm of the heartbeat. The monitor is a small, portable device. You can wear one while you do your normal daily activities. This is usually used to diagnose what is causing palpitations/syncope (passing out).  Your physician has requested that you have an echocardiogram. Echocardiography is a painless test that uses sound waves to create images of your heart. It provides your doctor with information about the size and shape of your heart and how well your heart's chambers and valves are working. This procedure takes approximately one hour. There are no restrictions for this procedure.  Follow-Up: Your physician recommends that you schedule a follow-up appointment in: After Test   Any Other Special Instructions Will Be Listed Below (If Applicable).   If you need a refill on your cardiac medications before your next appointment, please call your pharmacy.

## 2017-03-20 ENCOUNTER — Encounter (INDEPENDENT_AMBULATORY_CARE_PROVIDER_SITE_OTHER): Payer: Self-pay | Admitting: Internal Medicine

## 2017-03-20 NOTE — Telephone Encounter (Signed)
Patient was given an appointment for 04/03/17 at 3:30pm with Deberah Castle, NP.  A letter was mailed to the patient.

## 2017-03-21 ENCOUNTER — Telehealth: Payer: Self-pay | Admitting: Cardiology

## 2017-03-21 MED ORDER — METOPROLOL TARTRATE 25 MG PO TABS: 25.0000 mg | ORAL_TABLET | Freq: Every day | ORAL | 3 refills | Status: DC

## 2017-03-21 NOTE — Telephone Encounter (Signed)
°*  STAT* If patient is at the pharmacy, call can be transferred to refill team.   1. Which medications need to be refilled? (please list name of each medication and dose if known) metoprolol 25  2. Which pharmacy/location (including street and city if local pharmacy) is medication to be sent to? [facility] in [place]  3. Do they need a 30 day or 90 day supply?  [place]

## 2017-03-21 NOTE — Telephone Encounter (Signed)
Left msg, advising refill sent as requested.

## 2017-03-28 ENCOUNTER — Ambulatory Visit (INDEPENDENT_AMBULATORY_CARE_PROVIDER_SITE_OTHER): Payer: BLUE CROSS/BLUE SHIELD

## 2017-03-28 ENCOUNTER — Ambulatory Visit (HOSPITAL_COMMUNITY): Payer: BLUE CROSS/BLUE SHIELD | Attending: Cardiovascular Disease

## 2017-03-28 ENCOUNTER — Other Ambulatory Visit: Payer: Self-pay

## 2017-03-28 DIAGNOSIS — R079 Chest pain, unspecified: Secondary | ICD-10-CM | POA: Diagnosis not present

## 2017-03-28 DIAGNOSIS — Q249 Congenital malformation of heart, unspecified: Secondary | ICD-10-CM | POA: Diagnosis not present

## 2017-03-28 DIAGNOSIS — R002 Palpitations: Secondary | ICD-10-CM | POA: Diagnosis not present

## 2017-03-29 ENCOUNTER — Ambulatory Visit (HOSPITAL_COMMUNITY): Payer: BLUE CROSS/BLUE SHIELD

## 2017-04-03 ENCOUNTER — Ambulatory Visit (INDEPENDENT_AMBULATORY_CARE_PROVIDER_SITE_OTHER): Payer: BLUE CROSS/BLUE SHIELD | Admitting: Internal Medicine

## 2017-04-07 ENCOUNTER — Other Ambulatory Visit (INDEPENDENT_AMBULATORY_CARE_PROVIDER_SITE_OTHER): Payer: Self-pay | Admitting: Internal Medicine

## 2017-04-26 ENCOUNTER — Ambulatory Visit (HOSPITAL_COMMUNITY): Payer: BLUE CROSS/BLUE SHIELD

## 2017-05-05 DIAGNOSIS — M461 Sacroiliitis, not elsewhere classified: Secondary | ICD-10-CM | POA: Diagnosis not present

## 2017-05-05 DIAGNOSIS — M545 Low back pain: Secondary | ICD-10-CM | POA: Diagnosis not present

## 2017-05-05 DIAGNOSIS — Z6841 Body Mass Index (BMI) 40.0 and over, adult: Secondary | ICD-10-CM | POA: Diagnosis not present

## 2017-05-23 ENCOUNTER — Other Ambulatory Visit (HOSPITAL_COMMUNITY): Payer: BLUE CROSS/BLUE SHIELD

## 2017-05-23 ENCOUNTER — Ambulatory Visit (HOSPITAL_COMMUNITY): Payer: BLUE CROSS/BLUE SHIELD | Admitting: Oncology

## 2017-05-24 ENCOUNTER — Encounter (HOSPITAL_COMMUNITY): Payer: BLUE CROSS/BLUE SHIELD | Attending: Oncology | Admitting: Adult Health

## 2017-05-24 ENCOUNTER — Encounter (HOSPITAL_COMMUNITY): Payer: Self-pay

## 2017-05-24 ENCOUNTER — Encounter (HOSPITAL_BASED_OUTPATIENT_CLINIC_OR_DEPARTMENT_OTHER): Payer: BLUE CROSS/BLUE SHIELD

## 2017-05-24 ENCOUNTER — Encounter (HOSPITAL_COMMUNITY): Payer: Self-pay | Admitting: Adult Health

## 2017-05-24 ENCOUNTER — Ambulatory Visit (HOSPITAL_COMMUNITY): Payer: BLUE CROSS/BLUE SHIELD

## 2017-05-24 ENCOUNTER — Ambulatory Visit (HOSPITAL_COMMUNITY): Payer: BLUE CROSS/BLUE SHIELD | Admitting: Oncology

## 2017-05-24 ENCOUNTER — Encounter (HOSPITAL_COMMUNITY): Payer: BLUE CROSS/BLUE SHIELD | Attending: Oncology

## 2017-05-24 ENCOUNTER — Other Ambulatory Visit (HOSPITAL_COMMUNITY): Payer: BLUE CROSS/BLUE SHIELD

## 2017-05-24 VITALS — BP 139/66 | HR 80 | Temp 98.6°F | Resp 18 | Ht 66.0 in | Wt 275.0 lb

## 2017-05-24 DIAGNOSIS — E538 Deficiency of other specified B group vitamins: Secondary | ICD-10-CM

## 2017-05-24 DIAGNOSIS — D508 Other iron deficiency anemias: Secondary | ICD-10-CM | POA: Diagnosis not present

## 2017-05-24 DIAGNOSIS — Z9884 Bariatric surgery status: Secondary | ICD-10-CM

## 2017-05-24 DIAGNOSIS — D509 Iron deficiency anemia, unspecified: Secondary | ICD-10-CM | POA: Diagnosis not present

## 2017-05-24 DIAGNOSIS — K909 Intestinal malabsorption, unspecified: Secondary | ICD-10-CM

## 2017-05-24 LAB — COMPREHENSIVE METABOLIC PANEL
ALBUMIN: 4.1 g/dL (ref 3.5–5.0)
ALT: 36 U/L (ref 14–54)
ANION GAP: 8 (ref 5–15)
AST: 33 U/L (ref 15–41)
Alkaline Phosphatase: 95 U/L (ref 38–126)
BILIRUBIN TOTAL: 0.5 mg/dL (ref 0.3–1.2)
BUN: 15 mg/dL (ref 6–20)
CHLORIDE: 107 mmol/L (ref 101–111)
CO2: 25 mmol/L (ref 22–32)
Calcium: 9.3 mg/dL (ref 8.9–10.3)
Creatinine, Ser: 0.9 mg/dL (ref 0.44–1.00)
GFR calc Af Amer: >60 mL/min (ref >60)
GFR calc non Af Amer: >60 mL/min (ref >60)
GLUCOSE: 108 mg/dL — AB (ref 65–99)
POTASSIUM: 4.2 mmol/L (ref 3.5–5.1)
Sodium: 140 mmol/L (ref 135–145)
TOTAL PROTEIN: 7.5 g/dL (ref 6.5–8.1)

## 2017-05-24 LAB — CBC WITH DIFFERENTIAL/PLATELET
BASOS ABS: 0 10*3/uL (ref 0.0–0.1)
BASOS PCT: 0 %
EOS ABS: 0.2 10*3/uL (ref 0.0–0.7)
Eosinophils Relative: 2 %
HCT: 43.1 % (ref 36.0–46.0)
Hemoglobin: 14.1 g/dL (ref 12.0–15.0)
Lymphocytes Relative: 27 %
Lymphs Abs: 2.2 10*3/uL (ref 0.7–4.0)
MCH: 30.2 pg (ref 26.0–34.0)
MCHC: 32.7 g/dL (ref 30.0–36.0)
MCV: 92.3 fL (ref 78.0–100.0)
MONOS PCT: 8 %
Monocytes Absolute: 0.6 10*3/uL (ref 0.1–1.0)
Neutro Abs: 5.1 10*3/uL (ref 1.7–7.7)
Neutrophils Relative %: 63 %
PLATELETS: 201 10*3/uL (ref 150–400)
RBC: 4.67 MIL/uL (ref 3.87–5.11)
RDW: 14.8 % (ref 11.5–15.5)
WBC: 8.2 10*3/uL (ref 4.0–10.5)

## 2017-05-24 LAB — IRON AND TIBC
Iron: 77 ug/dL (ref 28–170)
Saturation Ratios: 20 % (ref 10.4–31.8)
TIBC: 378 ug/dL (ref 250–450)
UIBC: 301 ug/dL

## 2017-05-24 LAB — VITAMIN B12: Vitamin B-12: 258 pg/mL (ref 180–914)

## 2017-05-24 LAB — FERRITIN: FERRITIN: 26 ng/mL (ref 11–307)

## 2017-05-24 MED ORDER — CYANOCOBALAMIN 1000 MCG/ML IJ SOLN
INTRAMUSCULAR | Status: AC
  Filled 2017-05-24: qty 1

## 2017-05-24 MED ORDER — CYANOCOBALAMIN 1000 MCG/ML IJ SOLN
1000.0000 ug | Freq: Once | INTRAMUSCULAR | Status: AC
  Administered 2017-05-24: 1000 ug via INTRAMUSCULAR

## 2017-05-24 NOTE — Patient Instructions (Signed)
[place] at [facility] Discharge Instructions  RECOMMENDATIONS MADE BY THE CONSULTANT AND ANY TEST RESULTS WILL BE SENT TO YOUR REFERRING PHYSICIAN.   You were seen today by Mike Craze, NP You received your B-12 injection today Continue getting B-12 monthly B-12 here Follow up in 4 months with lab work See schedulers up front for appointments  Thank you for choosing [place] at [facility] to provide your oncology and hematology care.  To afford each patient quality time with our provider, please arrive at least 15 minutes before your scheduled appointment time.    If you have a lab appointment with the [facility] please come in thru the  Main Entrance and check in at the main information desk  You need to re-schedule your appointment should you arrive 10 or more minutes late.  We strive to give you quality time with our providers, and arriving late affects you and other patients whose appointments are after yours.  Also, if you no show three or more times for appointments you may be dismissed from the clinic at the providers discretion.     Again, thank you for choosing [facility].  Our hope is that these requests will decrease the amount of time that you wait before being seen by our physicians.       _____________________________________________________________  Should you have questions after your visit to [facility], please contact our office at [phone] between the hours of 8:30 a.m. and 4:30 p.m.  Voicemails left after 4:30 p.m. will not be returned until the following business day.  For prescription refill requests, have your pharmacy contact our office.       Resources For Cancer Patients and their Caregivers ? American Cancer Society: Can assist with transportation, wigs, general needs, runs Look Good Feel Better.        [phone] ? Cancer Care: Provides financial assistance,  online support groups, medication/co-pay assistance.  1-800-813-HOPE 828-687-1999) ? [place] Assists [place] cancer patients and their families through emotional , educational and financial support.  4078643132 ? [place] DSS Where to apply for food stamps, Medicaid and utility assistance. (502)338-0212 ? RCATS: Transportation to medical appointments. 4378277959 ? Social Security Administration: May apply for disability if have a Stage IV cancer. [phone] [phone] ? [employer], Disability and Transit Services: Assists with nutrition, care and transit needs. [place] Support Programs: @10RELATIVEDAYS @ > Cancer Support Group  2nd Tuesday of the month 1pm-2pm, Journey Room  > Creative Journey  3rd Tuesday of the month 1130am-1pm, Journey Room  > Look Good Feel Better  1st Wednesday of the month 10am-12 noon, Journey Room (Call [place] to register [phone])

## 2017-05-24 NOTE — Progress Notes (Signed)
Jody Stokes presents today for injection per MD orders. B12 1000 mcg administered IM in left deltoid. Administration without incident. Patient tolerated well. Patient discharged ambulatory and in stable condition from clinic. Patient to follow up as scheduled.

## 2017-05-24 NOTE — Progress Notes (Signed)
[place],  [address]   CLINIC:  Medical Oncology/Hematology  PCP:  Denny Levy, [place] [address] [phone]   REASON FOR VISIT:  Follow-up for Iron deficiency anemia AND vitamin B12 deficiency secondary to malabsorption s/p bariatric surgery in 2008  CURRENT THERAPY: IV iron prn AND monthly vitamin B12 injections      INTERVAL HISTORY:  Ms. Uncapher 56 y.o. female returns to cancer center for routine follow-up for iron deficiency anemia and vitamin B12 deficiency.   Overall, she tells me she has been feeling okay from a hematologic standpoint. She has a myriad of complaints, which have been chronic for her; does report "but these problems are related to my iron or my B12."   She has really been struggling with her RA lately; remains on methotrexate, but does not feel like it is helping much. Her joint pains interfere with her daily life. She is scheduled to see her rheumatologist in 2 weeks for follow-up; she has been on methotrexate for about 6 months per her report.  She is struggling with her sleep apnea and having to wear the CPAP. She takes Modafenil in the mornings "to help me wake up."  She saw a cardiologist recently for "racing heart rate." It comes and goes; "they started me on a medicine for it."    She denies any frank bleeding episodes including blood in her stools, dark/tarry stools, hematuria, nosebleeds, or gingival bleeding. Denies pica or pagophagia.  Reports that when her iron is low, she will start to crave ice, but she has not been experiencing that lately.  States that she is still having menstrual cycles, but they are irregular and very heavy. States that she thought she was going through menopause, because it had been nearly 1 year since her last menstrual cycle. However her menses resumed, but has been irregular.  Reports hot flashes and night sweats, which she attributes to being premenopausal.  Encouraged her to try OTC vitamin E at bedtime to see if her vasomotor symptoms improve.  Last IV iron was given in 08/2016. She continues to receive monthly B12 injections here at the cancer center; she attempted self administering B12 at home in the past, but this was unsuccessful and she resumed coming to the cancer center on a monthly basis.   She continues to work for the [employer] in [place]; she is the Archivist. She loves helping her clients in the community.    Oncology Flowsheet 08/15/2016  ferumoxytol [facility]) IV 510 mg     REVIEW OF SYSTEMS:  Review of Systems  Constitutional: Positive for fatigue.  Gastrointestinal: Positive for nausea.  Endocrine: Positive for hot flashes.  Genitourinary: Positive for menstrual problem.   Musculoskeletal: Positive for arthralgias.  Psychiatric/Behavioral: Positive for sleep disturbance.  All other systems reviewed and are negative.    PAST MEDICAL/SURGICAL HISTORY:  Past Medical History:  Diagnosis Date  . Anemia   . B12 deficiency 03/14/2016  . Congenital heart disease   . Depression   . Gastric bypass status for obesity 03/14/2016  . Iron deficiency anemia 03/14/2016  . Presence of IVC filter   . Pulmonary embolism ([place])   . Rheumatoid arthritis ([place])   . Sleep apnea   . Tubular adenoma of colon    Past Surgical History:  Procedure Laterality Date  . BIOPSY N/A 11/19/2014   Procedure: BIOPSY;  Surgeon: Daneil Dolin, MD;  Location: [facility]  ORS;  Service: Endoscopy;  Laterality: N/A;  Ascending Colon  . BIOPSY  09/22/2016   Procedure: BIOPSY;  Surgeon: Rogene Houston, MD;  Location: [facility] ENDO SUITE;  Service: Endoscopy;;  small bowel  . CARPAL TUNNEL RELEASE Bilateral   . CHOLECYSTECTOMY  2009  . COLONOSCOPY  07/2012   transverse colon 5 mm polyp removed  . COLONOSCOPY WITH PROPOFOL N/A 11/19/2014   RMR: mutilple colonic polyps removed as described above. Pancolonic diverticulosis(few).  Status post  mucosal biopsy to assess for microscopic coliits. /   . ESOPHAGOGASTRODUODENOSCOPY N/A 09/22/2016   Procedure: ESOPHAGOGASTRODUODENOSCOPY (EGD);  Surgeon: Rogene Houston, MD;  Location: [facility] ENDO SUITE;  Service: Endoscopy;  Laterality: N/A;  1:15  . FOOT SURGERY  2015   left  . GASTRIC BYPASS  2008  . HERNIA REPAIR    . HIATAL HERNIA REPAIR  2007  . POLYPECTOMY N/A 11/19/2014   Procedure: POLYPECTOMY;  Surgeon: Daneil Dolin, MD;  Location: [facility] ORS;  Service: Endoscopy;  Laterality: N/A;  ileocecal valve, Hepatic Flexure, Splenic Flexure, Descending Colon   . TUBAL LIGATION       SOCIAL HISTORY:  Social History   Social History  . Marital status: Married    Spouse name: N/A  . Number of children: 3  . Years of education: N/A   Occupational History  . Not on file.   Social History Main Topics  . Smoking status: Never Smoker  . Smokeless tobacco: Never Used  . Alcohol use No  . Drug use: No  . Sexual activity: Yes    Birth control/ protection: Surgical   Other Topics Concern  . Not on file   Social History Narrative   Lives with husband.      FAMILY HISTORY:  Family History  Problem Relation Age of Onset  . HIV Sister   . Hypertension Brother   . Diabetes Brother   . Kidney disease Brother   . Hypertension Brother   . Heart Problems Brother        No details.   . Hypertension Brother   . Colon cancer Neg Hx     CURRENT MEDICATIONS:  Outpatient Encounter Prescriptions as of 05/24/2017  Medication Sig  . Cyanocobalamin (VITAMIN B-12 IJ) Inject as directed every 30 (thirty) days.  Mar[place] Kitchen dicyclomine (BENTYL) 10 MG capsule Take 1 capsule (10 mg total) by mouth 3 (three) times daily before meals. Patient will take 2 two 10 mg capsules by mouth three times a day before a meal.  . DULoxetine (CYMBALTA) 60 MG capsule Take 60 mg by mouth at bedtime.   . folic acid (FOLVITE) 1 MG tablet Take 1 mg by mouth daily.  Marland Kitchen ibuprofen (ADVIL,MOTRIN) 200 MG tablet Take 400 mg by mouth  every 6 (six) hours as needed for headache or moderate pain.  . methotrexate (RHEUMATREX) 2.5 MG tablet Take 15 mg by mouth once a week.  . metoprolol tartrate (LOPRESSOR) 25 MG tablet Take 1 tablet (25 mg total) by mouth daily.  . modafinil (PROVIGIL) 200 MG tablet take 1 tablet by mouth once daily  . montelukast (SINGULAIR) 10 MG tablet Take 10 mg by mouth at bedtime.  . montelukast (SINGULAIR) 10 MG tablet take 1 tablet by mouth at bedtime  . ondansetron (ZOFRAN) 8 MG tablet Take 8 mg by mouth every 8 (eight) hours as needed for nausea or vomiting.  Marland Kitchen zolpidem (AMBIEN) 5 MG tablet Take 5 mg by mouth at bedtime.   . [DISCONTINUED] modafinil (PROVIGIL)  100 MG tablet Take 100 mg by mouth daily.   No facility-administered encounter medications on file as of 05/24/2017.     ALLERGIES:  Allergies  Allergen Reactions  . Viberzi [Eluxadoline] Other (See Comments)    Patient states that she has extreme abdominal cramping. Patient states that she has extreme abdominal cramping.     PHYSICAL EXAM:  ECOG Performance status: 1 - Symptomatic; remains independent.   Vitals:   05/24/17 1454  BP: 139/66  Pulse: 80  Resp: 18  Temp: 98.6 F (37 C)  SpO2: 99%   Filed Weights   05/24/17 1454  Weight: 275 lb (124.7 kg)    Physical Exam  Constitutional: She is oriented to person, place, and time and well-developed, well-nourished, and in no distress.  HENT:  Head: Normocephalic.  Mouth/Throat: Oropharynx is clear and moist. No oropharyngeal exudate.  Eyes: Pupils are equal, round, and reactive to light. Conjunctivae are normal. No scleral icterus.  Neck: Normal range of motion. Neck supple.  Cardiovascular: Normal rate and regular rhythm.   Pulmonary/Chest: Effort normal and breath sounds normal. No respiratory distress.  Abdominal: Soft. Bowel sounds are normal. There is no tenderness.  Musculoskeletal: Normal range of motion. She exhibits edema.  Bilateral wrists/hands mildly  erythematous and warm to touch; pt states this is d/t her RA.   Lymphadenopathy:    She has no cervical adenopathy.       Right: No supraclavicular adenopathy present.       Left: No supraclavicular adenopathy present.  Neurological: She is alert and oriented to person, place, and time. No cranial nerve deficit. Gait normal.  Skin: Skin is warm and dry. No rash noted.  Psychiatric: Mood, memory, affect and judgment normal.  Nursing note and vitals reviewed.    LABORATORY DATA:  I have reviewed the labs as listed.  CBC    Component Value Date/Time   WBC 8.2 05/24/2017 1427   RBC 4.67 05/24/2017 1427   HGB 14.1 05/24/2017 1427   HCT 43.1 05/24/2017 1427   PLT 201 05/24/2017 1427   MCV 92.3 05/24/2017 1427   MCH 30.2 05/24/2017 1427   MCHC 32.7 05/24/2017 1427   RDW 14.8 05/24/2017 1427   LYMPHSABS 2.2 05/24/2017 1427   MONOABS 0.6 05/24/2017 1427   EOSABS 0.2 05/24/2017 1427   BASOSABS 0.0 05/24/2017 1427   CMP Latest Ref Rng & Units 05/24/2017 01/31/2017 04/03/2016  Glucose 65 - 99 mg/dL 108(H) 164(H) 94  BUN 6 - 20 mg/dL _0 Creatinine 0.44 - 1.00 mg/dL 0.90 0.82 0.78  Sodium 135 - 145 mmol/L 140 136 136  Potassium 3.5 - 5.1 mmol/L 4.2 3.6 3.9  Chloride 101 - 111 mmol/L 107 103 103  CO2 22 - 32 mmol/L _1 Calcium 8.9 - 10.3 mg/dL 9.3 8.8(L) 8.7(L)  Total Protein 6.5 - 8.1 g/dL 7.5 7.0 7.8  Total Bilirubin 0.3 - 1.2 mg/dL 0.5 0.4 0.9  Alkaline Phos 38 - 126 U/L 95 98 103  AST 15 - 41 U/L 33 46(H) 38  ALT 14 - 54 U/L 36 72(H) 55(H)    PENDING LABS:    DIAGNOSTIC IMAGING:    PATHOLOGY:     ASSESSMENT & PLAN:   Iron deficiency anemia:  -Last ferritin in 11/2016 normal at 186. She has not required IV iron since 08/2016.  -Iron studies pending for today.  Her heavy menstrual bleeding may affect her iron stores if the bleeding persists. She is trying to find a  new gynecologist. Clinically, she is not symptomatic of iron deficiency (i.e. Denies pica). Her  hemoglobin is normal today as well, which is reassuring. She has fatigue, but states that her fatigue is chronic and present regardless of her iron stores. -She would likely not be a good candidate for oral iron supplementation given likely malabsorption with history of gastric bypass surgery. Therefore, we will continue to give IV iron when clinically indicated.  -Return to cancer center in 4 months with labs. Encouraged her to call us if she begins to experience ice craving and we could bring her in sooner, if needed. She agreed with this plan.   Vitamin B12 deficiency: -Likely secondary to malabsorption s/p gastric bypass surgery.  -Continue monthly vitamin B12 injections at cancer center. Historically, she was not able to self-administer B12 shots at home. We are happy to continue.       Dispo:  -Continue monthly vitamin B12 injections.  -Return to cancer center in 4 months for follow-up with labs.    All questions were answered to patient's stated satisfaction. Encouraged patient to call with any new concerns or questions before her next visit to the cancer center and we can certain see her sooner, if needed.    Plan of care discussed with Dr. Talbert Cage, who agrees with the above aforementioned.    Orders placed this encounter:  Orders Placed This Encounter  Procedures  . CBC with Differential/Platelet  . Basic metabolic panel  . Vitamin B12  . Folate  . Iron and TIBC  . Ferritin      Mike Craze, NP [place] [phone]

## 2017-05-24 NOTE — Patient Instructions (Signed)
[place] at [facility] Discharge Instructions  RECOMMENDATIONS MADE BY THE CONSULTANT AND ANY TEST RESULTS WILL BE SENT TO YOUR REFERRING PHYSICIAN.  Exam and discussion today with Mike Craze, NP. B12 monthly. Return in 4 months with lab work and office visit.   Thank you for choosing [place] at [facility] to provide your oncology and hematology care.  To afford each patient quality time with our provider, please arrive at least 15 minutes before your scheduled appointment time.    If you have a lab appointment with the [facility] please come in thru the  Main Entrance and check in at the main information desk  You need to re-schedule your appointment should you arrive 10 or more minutes late.  We strive to give you quality time with our providers, and arriving late affects you and other patients whose appointments are after yours.  Also, if you no show three or more times for appointments you may be dismissed from the clinic at the providers discretion.     Again, thank you for choosing [facility].  Our hope is that these requests will decrease the amount of time that you wait before being seen by our physicians.       _____________________________________________________________  Should you have questions after your visit to [facility], please contact our office at [phone] between the hours of 8:30 a.m. and 4:30 p.m.  Voicemails left after 4:30 p.m. will not be returned until the following business day.  For prescription refill requests, have your pharmacy contact our office.       Resources For Cancer Patients and their Caregivers ? American Cancer Society: Can assist with transportation, wigs, general needs, runs Look Good Feel Better.        ([phone] [phone] ? Cancer Care: Provides financial assistance, online support groups, medication/co-pay assistance.  1-800-813-HOPE (562)452-5338) ? [place] Assists [place] cancer patients and their families through emotional , educational and financial support.  779-446-4688 ? [place] DSS Where to apply for food stamps, Medicaid and utility assistance. 2143835308 ? [employer]: Transportation to medical appointments. (407)300-8910 ? Social Security Administration: May apply for disability if have a Stage IV cancer. 713 008 2328 (509)527-7206 ? [employer], Disability and Transit Services: Assists with nutrition, care and transit needs. [place] Support Programs: @10RELATIVEDAYS @ > Cancer Support Group  2nd Tuesday of the month 1pm-2pm, Journey Room  > Creative Journey  3rd Tuesday of the month 1130am-1pm, Journey Room  > Look Good Feel Better  1st Wednesday of the month 10am-12 noon, Journey Room (Call [place] to register [phone])

## 2017-05-25 ENCOUNTER — Ambulatory Visit: Payer: BLUE CROSS/BLUE SHIELD | Admitting: Cardiology

## 2017-06-08 ENCOUNTER — Other Ambulatory Visit (INDEPENDENT_AMBULATORY_CARE_PROVIDER_SITE_OTHER): Payer: Self-pay | Admitting: Internal Medicine

## 2017-06-08 DIAGNOSIS — M0609 Rheumatoid arthritis without rheumatoid factor, multiple sites: Secondary | ICD-10-CM | POA: Diagnosis not present

## 2017-06-08 DIAGNOSIS — M797 Fibromyalgia: Secondary | ICD-10-CM | POA: Diagnosis not present

## 2017-06-22 DIAGNOSIS — G4730 Sleep apnea, unspecified: Secondary | ICD-10-CM | POA: Diagnosis not present

## 2017-06-22 DIAGNOSIS — G4710 Hypersomnia, unspecified: Secondary | ICD-10-CM | POA: Diagnosis not present

## 2017-06-23 DIAGNOSIS — Z6841 Body Mass Index (BMI) 40.0 and over, adult: Secondary | ICD-10-CM | POA: Diagnosis not present

## 2017-06-23 DIAGNOSIS — M545 Low back pain: Secondary | ICD-10-CM | POA: Diagnosis not present

## 2017-06-23 DIAGNOSIS — M62830 Muscle spasm of back: Secondary | ICD-10-CM | POA: Diagnosis not present

## 2017-06-25 ENCOUNTER — Ambulatory Visit (HOSPITAL_COMMUNITY): Payer: BLUE CROSS/BLUE SHIELD

## 2017-07-03 IMAGING — CT CT ABDOMEN W/ CM
2 of 9 series · 13 of 46 positions shown, 19 images · IV contrast (Isovue)
Comparison: 03/30/2016

CLINICAL DATA: Epigastric pain for 6 months.

EXAM:
CT ABDOMEN WITH CONTRAST
TECHNIQUE: Multidetector CT imaging of the abdomen was performed using the
standard protocol following bolus administration of intravenous
contrast.
CONTRAST:  100mL TZR2G7-Q22 IOPAMIDOL ([id number]) INJECTION 61%

[Series 2: axial st · axial · 0.87mm/px · z∈[+1151,+1386]mm · 10 of 59 slices shown, 16 images]
[im 6/59  soft-tissue]
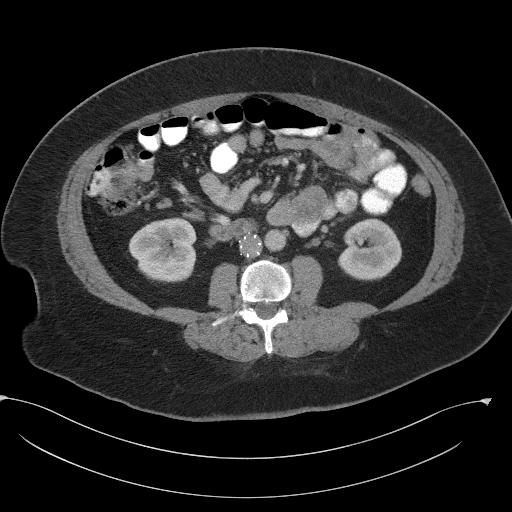
[im 6/59  bone]
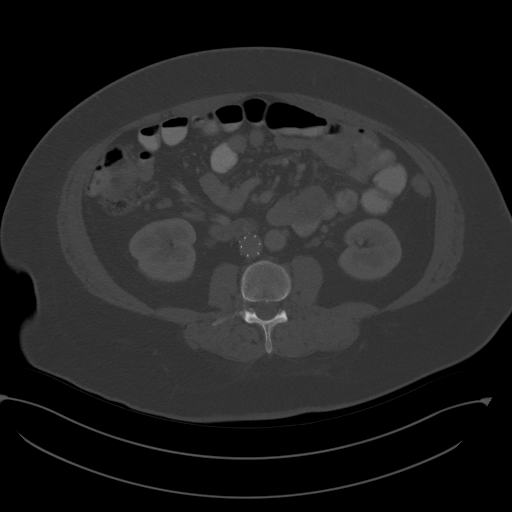
[im 11/59  soft-tissue]
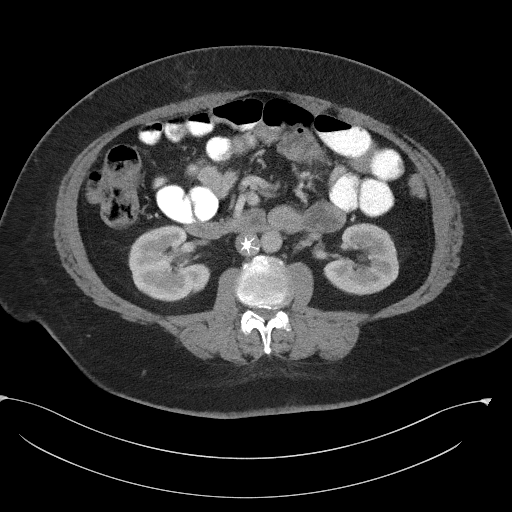
[im 16/59  soft-tissue]
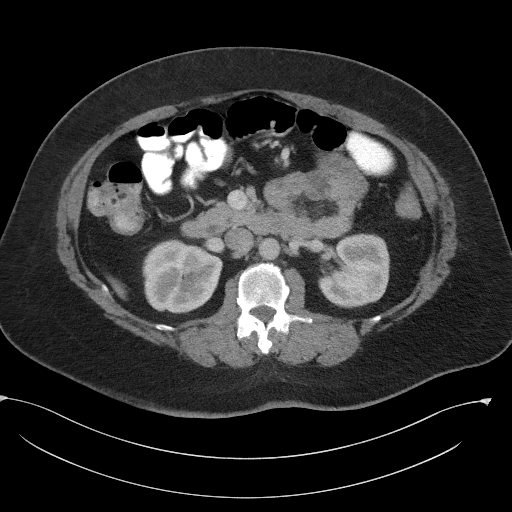
[im 22/59  soft-tissue]
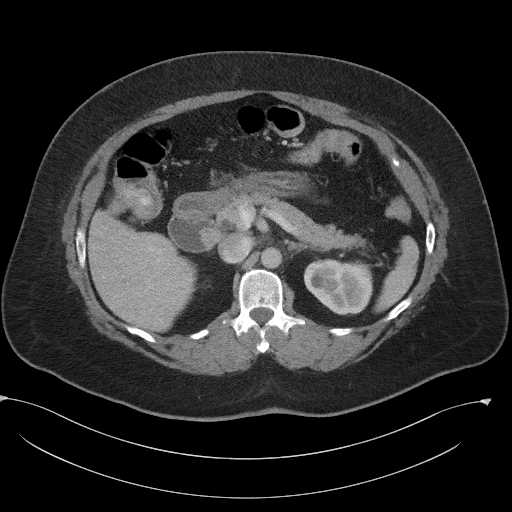
[im 27/59  soft-tissue]
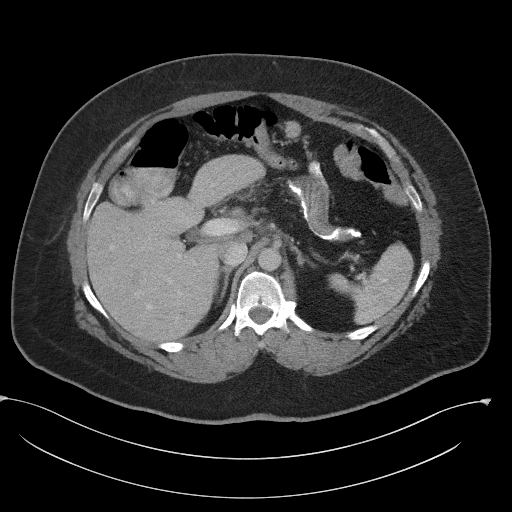
[im 32/59  soft-tissue]
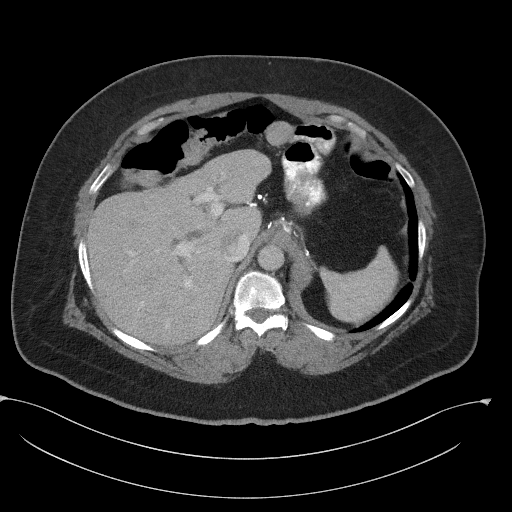
[im 37/59  soft-tissue]
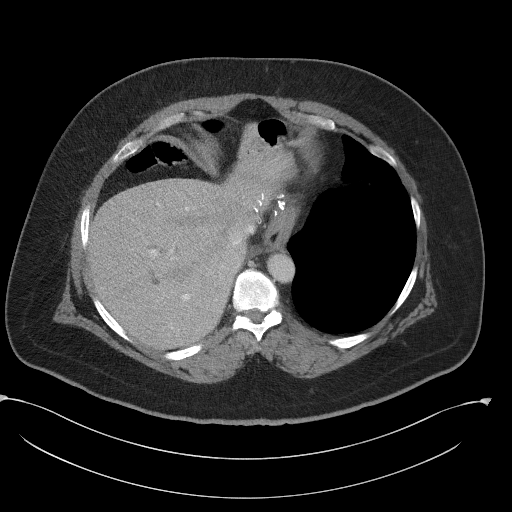
[im 37/59  lung]
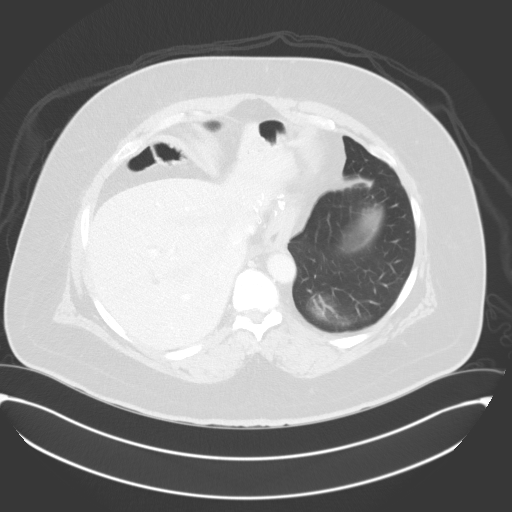
[im 43/59  soft-tissue]
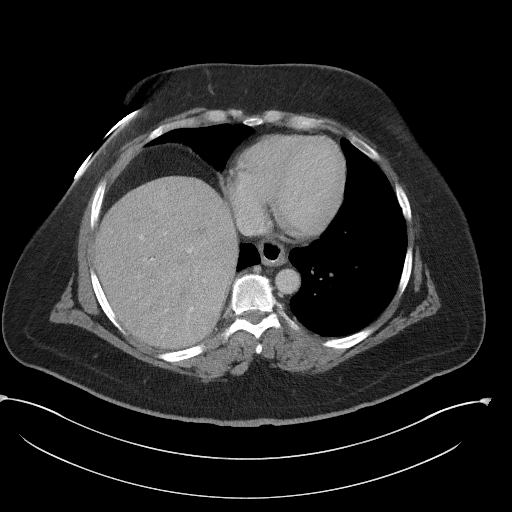
[im 43/59  lung]
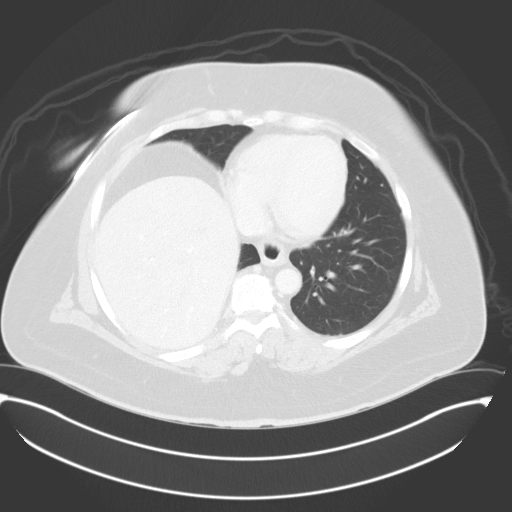
[im 48/59  soft-tissue]
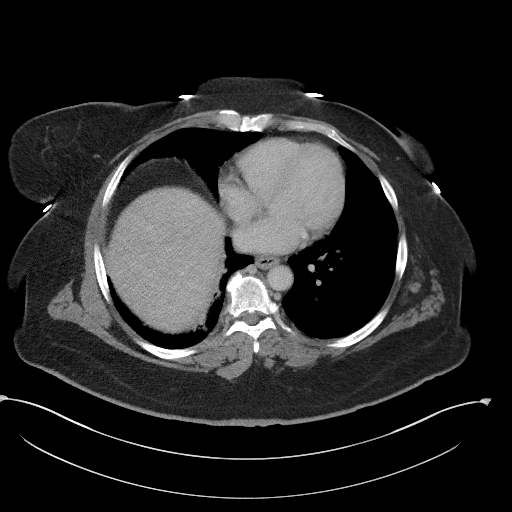
[im 48/59  lung]
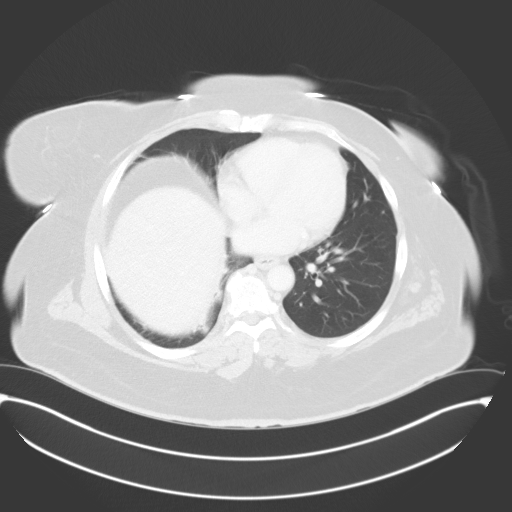
[im 48/59  bone]
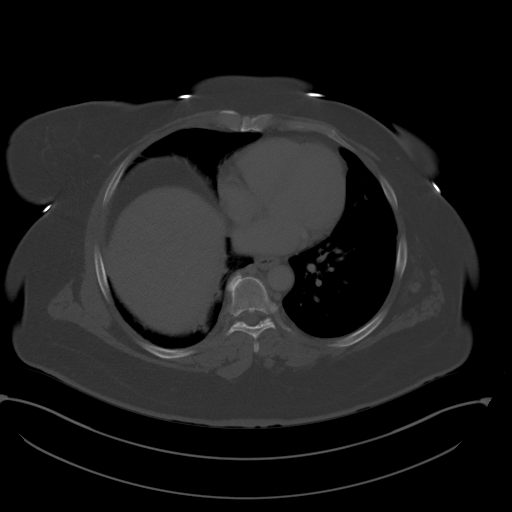
[im 53/59  soft-tissue]
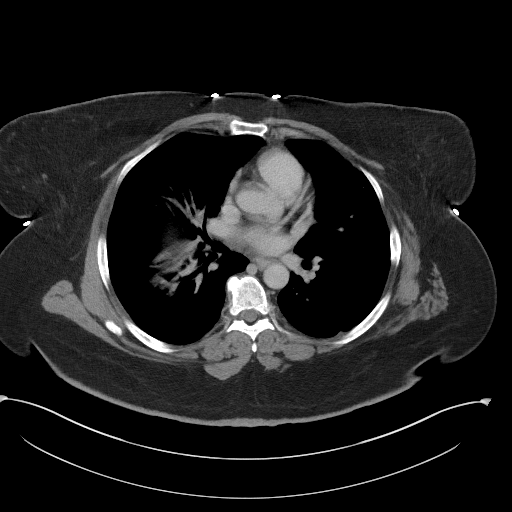
[im 53/59  lung]
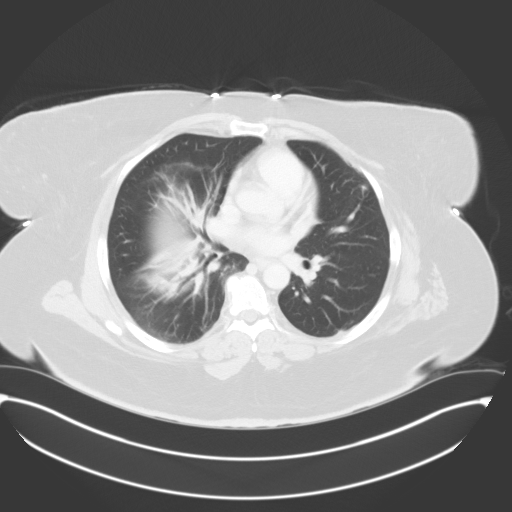

[Series 6: coronal st · coronal · 0.68mm/px · 3 of 101 slices shown]
[im 26/101  soft-tissue]
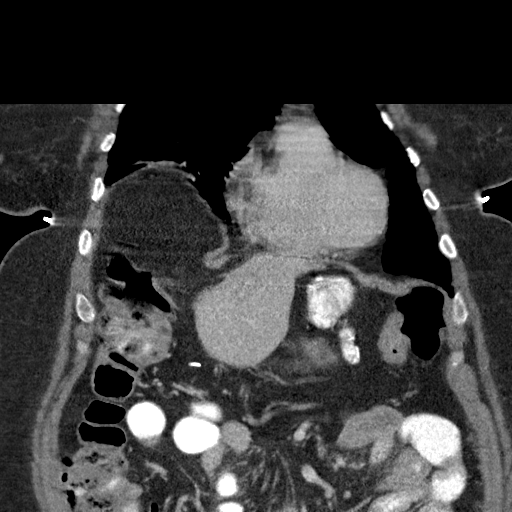
[im 51/101  soft-tissue]
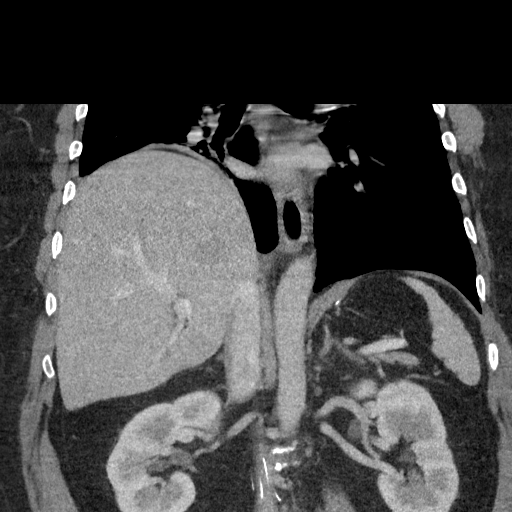
[im 76/101  soft-tissue]
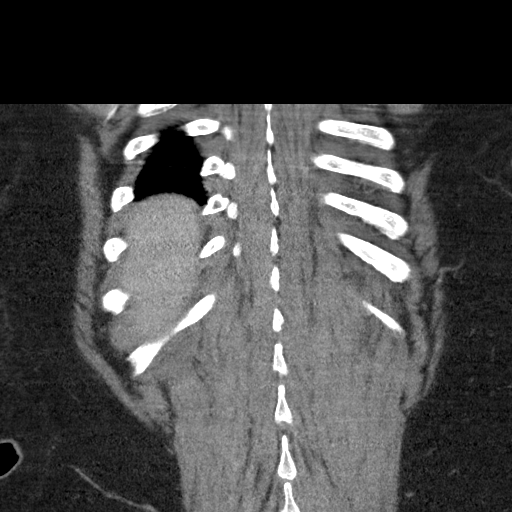

[13 of 46 positions shown; findings below may reference images not displayed]

FINDINGS: Lower chest:  Lung bases are clear.

Hepatobiliary: Postcholecystectomy. No biliary duct dilatation. The
LEFT hepatic lobe is small.

Pancreas: Normal pancreatic parenchymal. No ductal dilatation or
inflammation.

Spleen: Normal spleen.

Adrenals/urinary tract: Adrenal glands and kidneys are normal.

Stomach/Bowel: Post gastric bypass anatomy.) No obstruction. Limited
view of small bowel and colon are unremarkable.

Vascular/Lymphatic: Abdominal aortic normal caliber. No
retroperitoneal periportal lymphadenopathy. IVC filter noted

Musculoskeletal: No aggressive osseous lesion
IMPRESSION: 1. No acute findings in the abdomen.
2. Post gastric bypass anatomy without evident complication.
3. Postcholecystectomy.

## 2017-07-06 ENCOUNTER — Ambulatory Visit: Payer: BLUE CROSS/BLUE SHIELD | Admitting: Cardiology

## 2017-07-10 DIAGNOSIS — M0609 Rheumatoid arthritis without rheumatoid factor, multiple sites: Secondary | ICD-10-CM | POA: Diagnosis not present

## 2017-07-14 ENCOUNTER — Other Ambulatory Visit: Payer: Self-pay | Admitting: Cardiology

## 2017-07-16 NOTE — Telephone Encounter (Signed)
REFILL 

## 2017-07-25 ENCOUNTER — Ambulatory Visit (HOSPITAL_COMMUNITY): Payer: BLUE CROSS/BLUE SHIELD

## 2017-07-31 DIAGNOSIS — M545 Low back pain: Secondary | ICD-10-CM | POA: Diagnosis not present

## 2017-07-31 DIAGNOSIS — M62830 Muscle spasm of back: Secondary | ICD-10-CM | POA: Diagnosis not present

## 2017-07-31 DIAGNOSIS — Z6841 Body Mass Index (BMI) 40.0 and over, adult: Secondary | ICD-10-CM | POA: Diagnosis not present

## 2017-08-03 DIAGNOSIS — S299XXA Unspecified injury of thorax, initial encounter: Secondary | ICD-10-CM | POA: Diagnosis not present

## 2017-08-03 DIAGNOSIS — Z79899 Other long term (current) drug therapy: Secondary | ICD-10-CM | POA: Diagnosis not present

## 2017-08-03 DIAGNOSIS — M797 Fibromyalgia: Secondary | ICD-10-CM | POA: Diagnosis not present

## 2017-08-03 DIAGNOSIS — R079 Chest pain, unspecified: Secondary | ICD-10-CM | POA: Diagnosis not present

## 2017-08-03 DIAGNOSIS — S20211A Contusion of right front wall of thorax, initial encounter: Secondary | ICD-10-CM | POA: Diagnosis not present

## 2017-08-03 DIAGNOSIS — S3991XA Unspecified injury of abdomen, initial encounter: Secondary | ICD-10-CM | POA: Diagnosis not present

## 2017-08-07 DIAGNOSIS — Z79899 Other long term (current) drug therapy: Secondary | ICD-10-CM | POA: Diagnosis not present

## 2017-08-07 DIAGNOSIS — M0609 Rheumatoid arthritis without rheumatoid factor, multiple sites: Secondary | ICD-10-CM | POA: Diagnosis not present

## 2017-08-08 NOTE — Progress Notes (Deleted)
Cardiology Office Note   Date:  08/08/2017   ID:  Jody Stokes, DOB 1961-03-31, MRN [id number]  PCP:  Denny Levy, PA  Cardiologist:   Minus Breeding, MD  Referring:  Denny Levy, PA  No chief complaint on file.     History of Present Illness: Jody Stokes is a 56 y.o. female who is referred by Denny Levy, PA for evaluation of palpitations.  She has history of congenital heart disease with surgery at 11 months.  She has not the details of this has not had follow-up with cardiologist as an adult. She reports a negative stress test in 2008 prior to bariatric surgery. She recently had increased palpitations.   She stopped caffeine and this did not seem to help.  Holter demonstrated no significant arrhythmias.  Echo was unremarkable.   ***   She describes rapid beats many in a row. It might seem to last all day. It's happening every day. She knows it when she is lying down or sitting. It might happen with activity. At night she puts her CPAP on and goes to sleep and she doesn't have these symptoms. She denies any presyncope though she does get dizzy with this. She's not had syncope. She's had some fleeting sharp chest pain. She is active at work working in [employer]. She cannot bring on the symptoms. Seems to happen unprovoked. She doesn't have any PND or orthopnea but she's had increasing dyspnea with exertion.     Past Medical History:  Diagnosis Date  . Anemia   . B12 deficiency 03/14/2016  . Congenital heart disease   . Depression   . Gastric bypass status for obesity 03/14/2016  . Iron deficiency anemia 03/14/2016  . Presence of IVC filter   . Pulmonary embolism ([place])   . Rheumatoid arthritis ([place])   . Sleep apnea   . Tubular adenoma of colon     Past Surgical History:  Procedure Laterality Date  . CARPAL TUNNEL RELEASE Bilateral   . CHOLECYSTECTOMY  2009  . COLONOSCOPY  07/2012   transverse colon 5 mm polyp removed  . FOOT  SURGERY  2015   left  . GASTRIC BYPASS  2008  . HERNIA REPAIR    . HIATAL HERNIA REPAIR  2007  . TUBAL LIGATION       Current Outpatient Medications  Medication Sig Dispense Refill  . Cyanocobalamin (VITAMIN B-12 IJ) Inject as directed every 30 (thirty) days.    Marland Kitchen dicyclomine (BENTYL) 10 MG capsule Take 1 capsule (10 mg total) by mouth 3 (three) times daily before meals. Patient will take 2 two 10 mg capsules by mouth three times a day before a meal. 180 capsule 2  . DULoxetine (CYMBALTA) 60 MG capsule Take 60 mg by mouth at bedtime.   0  . folic acid (FOLVITE) 1 MG tablet Take 1 mg by mouth daily.  1  . ibuprofen (ADVIL,MOTRIN) 200 MG tablet Take 400 mg by mouth every 6 (six) hours as needed for headache or moderate pain.    . methotrexate (RHEUMATREX) 2.5 MG tablet Take 15 mg by mouth once a week.  0  . metoprolol tartrate (LOPRESSOR) 25 MG tablet take 1 tablet by mouth once daily 30 tablet 6  . modafinil (PROVIGIL) 200 MG tablet take 1 tablet by mouth once daily    . montelukast (SINGULAIR) 10 MG tablet Take 10 mg by mouth at bedtime.    . montelukast (SINGULAIR) 10 MG tablet  take 1 tablet by mouth at bedtime 30 tablet 5  . ondansetron (ZOFRAN) 8 MG tablet Take 8 mg by mouth every 8 (eight) hours as needed for nausea or vomiting.    Marland Kitchen zolpidem (AMBIEN) 5 MG tablet Take 5 mg by mouth at bedtime.   0   No current facility-administered medications for this visit.     Allergies:   Viberzi [eluxadoline]   ROS:  Please see the history of present illness.   Otherwise, review of systems are positive for ***.   All other systems are reviewed and negative.    PHYSICAL EXAM: VS:  There were no vitals taken for this visit. , BMI There is no height or weight on file to calculate BMI.  GENERAL:  Well appearing NECK:  No jugular venous distention, waveform within normal limits, carotid upstroke brisk and symmetric, no bruits, no thyromegaly LUNGS:  Clear to auscultation bilaterally CHEST:   Unremarkable HEART:  PMI not displaced or sustained,S1 and S2 within normal limits, no S3, no S4, no clicks, no rubs, *** murmurs ABD:  Flat, positive bowel sounds normal in frequency in pitch, no bruits, no rebound, no guarding, no midline pulsatile mass, no hepatomegaly, no splenomegaly EXT:  2 plus pulses throughout, no edema, no cyanosis no clubbing   GENERAL:  Well appearing HEENT:  Pupils equal round and reactive, fundi not visualized, oral mucosa unremarkable NECK:  No jugular venous distention, waveform within normal limits, carotid upstroke brisk and symmetric, no bruits, no thyromegaly LYMPHATICS:  No cervical, inguinal adenopathy LUNGS:  Clear to auscultation bilaterally BACK:  No CVA tenderness CHEST:  Well healed left thoracotomy scar. HEART:  PMI not displaced or sustained,S1 and S2 within normal limits, no S3, no S4, no clicks, no rubs, no murmurs ABD:  Flat, positive bowel sounds normal in frequency in pitch, no bruits, no rebound, no guarding, no midline pulsatile mass, no hepatomegaly, no splenomegaly EXT:  2 plus pulses throughout, no edema, no cyanosis no clubbing SKIN:  No rashes no nodules NEURO:  Cranial nerves II through XII grossly intact, motor grossly intact throughout PSYCH:  Cognitively intact, oriented to person place and time    EKG:  EKG is *** ordered today. ***   Recent Labs: 03/15/2017: TSH 1.250 05/24/2017: ALT 36; BUN 15; Creatinine, Ser 0.90; Hemoglobin 14.1; Platelets 201; Potassium 4.2; Sodium 140    Lipid Panel No results found for: CHOL, TRIG, HDL, CHOLHDL, VLDL, LDLCALC, LDLDIRECT    Wt Readings from Last 3 Encounters:  05/24/17 275 lb (124.7 kg)  03/15/17 270 lb (122.5 kg)  01/31/17 274 lb 9.6 oz (124.6 kg)      Other studies Reviewed: Additional studies/ records that were reviewed today include:  ***. Review of the above records demonstrates:  ***   ASSESSMENT AND PLAN:   PALPITATIONS:  Holter demonstrated no significant  arrhythmia.  She had a normal echo.   ***  I'm going to start with a TSH which I don't see in her records. I'll get an echocardiogram to understand her past congenital history any evidence of heart disease. She will have a 48 hour Holter monitor. Further evaluation will be based on these results  SOB:  ***  I'll have a low threshold for a POET (Plain Old Exercise Treadmill)  HTN:  ***  The blood pressure is mildly elevated . No change in medications is indicated. We will continue with therapeutic lifestyle changes (TLC).'  Current medicines are reviewed at length with the patient today.  The patient does not have concerns regarding medicines.  The following changes have been made:   ***  Labs/ tests ordered today include:   ***  No orders of the defined types were placed in this encounter.    Disposition:   FU with me  ***   Signed, Minus Breeding, MD  08/08/2017 10:13 PM    [facility]

## 2017-08-09 ENCOUNTER — Ambulatory Visit: Payer: BLUE CROSS/BLUE SHIELD | Admitting: Cardiology

## 2017-08-09 ENCOUNTER — Encounter: Payer: Self-pay | Admitting: *Deleted

## 2017-08-10 DIAGNOSIS — S2001XA Contusion of right breast, initial encounter: Secondary | ICD-10-CM | POA: Diagnosis not present

## 2017-08-10 DIAGNOSIS — Z6841 Body Mass Index (BMI) 40.0 and over, adult: Secondary | ICD-10-CM | POA: Diagnosis not present

## 2017-08-10 DIAGNOSIS — R0789 Other chest pain: Secondary | ICD-10-CM | POA: Diagnosis not present

## 2017-08-27 ENCOUNTER — Encounter (HOSPITAL_COMMUNITY): Payer: BLUE CROSS/BLUE SHIELD | Attending: Oncology

## 2017-09-03 DIAGNOSIS — G4733 Obstructive sleep apnea (adult) (pediatric): Secondary | ICD-10-CM | POA: Diagnosis not present

## 2017-09-21 ENCOUNTER — Other Ambulatory Visit (HOSPITAL_COMMUNITY): Payer: Self-pay | Admitting: *Deleted

## 2017-09-25 NOTE — Progress Notes (Deleted)
[place], [place] [address]   CLINIC:  Medical Oncology/Hematology  PCP:  Denny Levy, [place] [address] [phone]   REASON FOR VISIT:  Follow-up for Iron deficiency anemia AND vitamin B12 deficiency secondary to malabsorption s/p bariatric surgery in 2008  CURRENT THERAPY: IV iron prn AND monthly vitamin B12 injections      INTERVAL HISTORY:  Jody Stokes 56 y.o. female returns to cancer center for routine follow-up for iron deficiency anemia and vitamin B12 deficiency.   ***  Overall, she tells me she has been feeling okay from a hematologic standpoint. She has a myriad of complaints, which have been chronic for her; does report "but these problems are related to my iron or my B12."   She has really been struggling with her RA lately; remains on methotrexate, but does not feel like it is helping much. Her joint pains interfere with her daily life. She is scheduled to see her rheumatologist in 2 weeks for follow-up; she has been on methotrexate for about 6 months per her report.  She is struggling with her sleep apnea and having to wear the CPAP. She takes Modafenil in the mornings "to help me wake up."  She saw a cardiologist recently for "racing heart rate." It comes and goes; "they started me on a medicine for it."    She denies any frank bleeding episodes including blood in her stools, dark/tarry stools, hematuria, nosebleeds, or gingival bleeding. Denies pica or pagophagia.  Reports that when her iron is low, she will start to crave ice, but she has not been experiencing that lately.  States that she is still having menstrual cycles, but they are irregular and very heavy. States that she thought she was going through menopause, because it had been nearly 1 year since her last menstrual cycle. However her menses resumed, but has been irregular.  Reports hot flashes and night sweats, which she attributes to being  premenopausal. Encouraged her to try OTC vitamin E at bedtime to see if her vasomotor symptoms improve.  Last IV iron was given in 08/2016. She continues to receive monthly B12 injections here at the cancer center; she attempted self administering B12 at home in the past, but this was unsuccessful and she resumed coming to the cancer center on a monthly basis.   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 She loves helping her clients in the community.    Oncology Flowsheet 08/15/2016  ferumoxytol [facility]) IV 510 mg     REVIEW OF SYSTEMS:  Review of Systems  Constitutional: Positive for fatigue.  Gastrointestinal: Positive for nausea.  Endocrine: Positive for hot flashes.  Genitourinary: Positive for menstrual problem.   Musculoskeletal: Positive for arthralgias.  Psychiatric/Behavioral: Positive for sleep disturbance.  All other systems reviewed and are negative.    PAST MEDICAL/SURGICAL HISTORY:  Past Medical History:  Diagnosis Date  . Anemia   . B12 deficiency 03/14/2016  . Congenital heart disease   . Depression   . Gastric bypass status for obesity 03/14/2016  . Iron deficiency anemia 03/14/2016  . Presence of IVC filter   . Pulmonary embolism ([place])   . Rheumatoid arthritis ([place])   . Sleep apnea   . Tubular adenoma of colon    Past Surgical History:  Procedure Laterality Date  . BIOPSY N/A 11/19/2014   Procedure: BIOPSY;  Surgeon: Daneil Dolin, MD;  Location: [facility];  Service: Endoscopy;  Laterality: N/A;  Ascending Colon  . BIOPSY  09/22/2016   Procedure: BIOPSY;  Surgeon: Rogene Houston, MD;  Location: [facility];  Service: Endoscopy;;  small bowel  . CARPAL TUNNEL RELEASE Bilateral   . CHOLECYSTECTOMY  2009  . COLONOSCOPY  07/2012   transverse colon 5 mm polyp removed  . COLONOSCOPY WITH PROPOFOL N/A 11/19/2014   RMR: mutilple colonic polyps removed as described above. Pancolonic  diverticulosis(few).  Status post mucosal biopsy to assess for microscopic coliits. /   . ESOPHAGOGASTRODUODENOSCOPY N/A 09/22/2016   Procedure: ESOPHAGOGASTRODUODENOSCOPY (EGD);  Surgeon: Rogene Houston, MD;  Location: [facility];  Service: Endoscopy;  Laterality: N/A;  1:15  . FOOT SURGERY  2015   left  . GASTRIC BYPASS  2008  . HERNIA REPAIR    . HIATAL HERNIA REPAIR  2007  . POLYPECTOMY N/A 11/19/2014   Procedure: POLYPECTOMY;  Surgeon: Daneil Dolin, MD;  Location: [facility];  Service: Endoscopy;  Laterality: N/A;  ileocecal valve, Hepatic Flexure, Splenic Flexure, Descending Colon   . TUBAL LIGATION       SOCIAL HISTORY:  Social History   Socioeconomic History  . Marital status: Married    Spouse name: Not on file  . Number of children: 3  . Years of education: Not on file  . Highest education level: Not on file  Social Needs  . Financial resource strain: Not on file  . Food insecurity - worry: Not on file  . Food insecurity - inability: Not on file  . Transportation needs - medical: Not on file  . Transportation needs - non-medical: Not on file  Occupational History  . Not on file  Tobacco Use  . Smoking status: Never Smoker  . Smokeless tobacco: Never Used  Substance and Sexual Activity  . Alcohol use: No    Alcohol/week: 0.0 oz  . Drug use: No  . Sexual activity: Yes    Birth control/protection: Surgical  Other Topics Concern  . Not on file  Social History Narrative   Lives with husband.      FAMILY HISTORY:  Family History  Problem Relation Age of Onset  . HIV Sister   . Hypertension Brother   . Diabetes Brother   . Kidney disease Brother   . Hypertension Brother   . Heart Problems Brother        No details.   . Hypertension Brother   . Colon cancer Neg Hx     CURRENT MEDICATIONS:  Outpatient Encounter Medications as of 09/26/2017  Medication Sig  . Cyanocobalamin (VITAMIN B-12 IJ) Inject as directed every 30 (thirty) days.  Marland Kitchen dicyclomine  (BENTYL) 10 MG capsule Take 1 capsule (10 mg total) by mouth 3 (three) times daily before meals. Patient will take 2 two 10 mg capsules by mouth three times a day before a meal.  . DULoxetine (CYMBALTA) 60 MG capsule Take 60 mg by mouth at bedtime.   . folic acid (FOLVITE) 1 MG tablet Take 1 mg by mouth daily.  Marland Kitchen ibuprofen (ADVIL,MOTRIN) 200 MG tablet Take 400 mg by mouth every 6 (six) hours as needed for headache or moderate pain.  . methotrexate (RHEUMATREX) 2.5 MG tablet Take 15 mg by mouth once a week.  . metoprolol tartrate (LOPRESSOR) 25 MG tablet take 1 tablet by mouth once daily  . modafinil (PROVIGIL) 200 MG tablet take 1 tablet by mouth once daily  . montelukast (SINGULAIR) 10 MG tablet  Take 10 mg by mouth at bedtime.  . montelukast (SINGULAIR) 10 MG tablet take 1 tablet by mouth at bedtime  . ondansetron (ZOFRAN) 8 MG tablet Take 8 mg by mouth every 8 (eight) hours as needed for nausea or vomiting.  Marland Kitchen zolpidem (AMBIEN) 5 MG tablet Take 5 mg by mouth at bedtime.    No facility-administered encounter medications on file as of 09/26/2017.     ALLERGIES:  Allergies  Allergen Reactions  . Viberzi [Eluxadoline] Other (See Comments)    Patient states that she has extreme abdominal cramping. Patient states that she has extreme abdominal cramping.     PHYSICAL EXAM:  ECOG Performance status: 1 - Symptomatic; remains independent.   There were no vitals filed for this visit. There were no vitals filed for this visit.  Physical Exam  Constitutional: She is oriented to person, place, and time and well-developed, well-nourished, and in no distress.  HENT:  Head: Normocephalic.  Mouth/Throat: Oropharynx is clear and moist. No oropharyngeal exudate.  Eyes: Conjunctivae are normal. Pupils are equal, round, and reactive to light. No scleral icterus.  Neck: Normal range of motion. Neck supple.  Cardiovascular: Normal rate and regular rhythm.  Pulmonary/Chest: Effort normal and breath  sounds normal. No respiratory distress.  Abdominal: Soft. Bowel sounds are normal. There is no tenderness.  Musculoskeletal: Normal range of motion. She exhibits edema.  Bilateral wrists/hands mildly erythematous and warm to touch; pt states this is d/t her RA.   Lymphadenopathy:    She has no cervical adenopathy.       Right: No supraclavicular adenopathy present.       Left: No supraclavicular adenopathy present.  Neurological: She is alert and oriented to person, place, and time. No cranial nerve deficit. Gait normal.  Skin: Skin is warm and dry. No rash noted.  Psychiatric: Mood, memory, affect and judgment normal.  Nursing note and vitals reviewed.    LABORATORY DATA:  I have reviewed the labs as listed.  CBC    Component Value Date/Time   WBC 8.2 05/24/2017 1427   RBC 4.67 05/24/2017 1427   HGB 14.1 05/24/2017 1427   HCT 43.1 05/24/2017 1427   PLT 201 05/24/2017 1427   MCV 92.3 05/24/2017 1427   MCH 30.2 05/24/2017 1427   MCHC 32.7 05/24/2017 1427   RDW 14.8 05/24/2017 1427   LYMPHSABS 2.2 05/24/2017 1427   MONOABS 0.6 05/24/2017 1427   EOSABS 0.2 05/24/2017 1427   BASOSABS 0.0 05/24/2017 1427   CMP Latest Ref Rng & Units 05/24/2017 01/31/2017 04/03/2016  Glucose 65 - 99 mg/dL 108(H) 164(H) 94  BUN 6 - 20 mg/dL '15 17 15  ' Creatinine 0.44 - 1.00 mg/dL 0.90 0.82 0.78  Sodium 135 - 145 mmol/L 140 136 136  Potassium 3.5 - 5.1 mmol/L 4.2 3.6 3.9  Chloride 101 - 111 mmol/L 107 103 103  CO2 22 - 32 mmol/L '25 26 26  ' Calcium 8.9 - 10.3 mg/dL 9.3 8.8(L) 8.7(L)  Total Protein 6.5 - 8.1 g/dL 7.5 7.0 7.8  Total Bilirubin 0.3 - 1.2 mg/dL 0.5 0.4 0.9  Alkaline Phos 38 - 126 U/L 95 98 103  AST 15 - 41 U/L 33 46(H) 38  ALT 14 - 54 U/L 36 72(H) 55(H)    PENDING LABS:    DIAGNOSTIC IMAGING:    PATHOLOGY:     ASSESSMENT & PLAN:   Iron deficiency anemia:  -Last ferritin in 11/2016 normal at 186. She has not required IV iron since 08/2016.  -  Iron studies pending for today.   Her heavy menstrual bleeding may affect her iron stores if the bleeding persists. She is trying to find a new gynecologist. Clinically, she is not symptomatic of iron deficiency (i.e. Denies pica). Her hemoglobin is normal today as well, which is reassuring. She has fatigue, but states that her fatigue is chronic and present regardless of her iron stores. -She would likely not be a good candidate for oral iron supplementation given likely malabsorption with history of gastric bypass surgery. Therefore, we will continue to give IV iron when clinically indicated.  -Return to cancer center in 4 months with labs. Encouraged her to call us if she begins to experience ice craving and we could bring her in sooner, if needed. She agreed with this plan.   Vitamin B12 deficiency: -Likely secondary to malabsorption s/p gastric bypass surgery.  -Continue monthly vitamin B12 injections at cancer center. Historically, she was not able to self-administer B12 shots at home. We are happy to continue.       Dispo:  -Continue monthly vitamin B12 injections.  -Return to cancer center in 4 months for follow-up with labs.    All questions were answered to patient's stated satisfaction. Encouraged patient to call with any new concerns or questions before her next visit to the cancer center and we can certain see her sooner, if needed.      Orders placed this encounter:  No orders of the defined types were placed in this encounter.     Mike Craze, NP [place] [phone]

## 2017-09-26 ENCOUNTER — Other Ambulatory Visit (HOSPITAL_COMMUNITY): Payer: BLUE CROSS/BLUE SHIELD

## 2017-09-26 ENCOUNTER — Ambulatory Visit (HOSPITAL_COMMUNITY): Payer: BLUE CROSS/BLUE SHIELD

## 2017-09-26 ENCOUNTER — Ambulatory Visit (HOSPITAL_COMMUNITY): Payer: BLUE CROSS/BLUE SHIELD | Admitting: Adult Health

## 2017-09-26 ENCOUNTER — Telehealth (INDEPENDENT_AMBULATORY_CARE_PROVIDER_SITE_OTHER): Payer: Self-pay | Admitting: Internal Medicine

## 2017-09-26 MED ORDER — DICYCLOMINE HCL 10 MG PO CAPS: 10.0000 mg | ORAL_CAPSULE | Freq: Three times a day (TID) | ORAL | 4 refills | Status: DC

## 2017-09-26 NOTE — Telephone Encounter (Signed)
Rx given to [facility]

## 2017-09-26 NOTE — Telephone Encounter (Signed)
Patient called and requested a refill on Dicyclomine to be called to [facility] in [place].  [phone]

## 2017-09-26 NOTE — Telephone Encounter (Signed)
err

## 2017-09-30 NOTE — Progress Notes (Signed)
Jody Stokes, [place] [address]   CLINIC:  Medical Oncology/Hematology  PCP:  Denny Levy, [place] [address] [phone]   REASON FOR VISIT:  Follow-up for Iron deficiency anemia AND vitamin B12 deficiency secondary to malabsorption s/p bariatric surgery in 2008  CURRENT THERAPY: IV iron prn AND monthly vitamin B12 injections   INTERVAL HISTORY:  Jody Stokes 56 y.o. female returns to cancer center for routine follow-up for iron deficiency anemia and vitamin B12 deficiency.   Overall, she tells me she has been feeling tired.  This is not new.  She continues to struggle with her RA and remains on methotrexate.  She is scheduled to see her rheumatologist next week.  Her joint pain continues to be problematic mainly in her hands and elbows.  She continues to use her CPAP for sleep apnea and feels well rested in the mornings but is severely fatigued by lunchtime.  She continues to take Modenfil in the mornings to give her some energy and wake up.  She has not complained of tachycardia or chest pain.  She denies any bleeding.  She denies Pica.  Patient has not had a menstrual cycle since May 2018.  She admits to occasional bilateral ankle and foot swelling from surgery she has had in the past.  This resolves with elevation and rest.  She has occasional diarrhea related to irritable bowel syndrome.  She is able to control this with medications.  She admits to occasional dizziness and nausea midday and attributes it to medications she takes.  She tries to take medications with food but sometimes she forgets to eat.  Last IV iron was given in 08/2016. She continues to receive monthly B12 injections here at the cancer center; she attempted self administering B12 at home in the past, but this was unsuccessful and she resumed coming to the cancer center on a monthly basis.  She has missed several doses of B12 injections over the past few months.  Last  injection was May 24, 2017.  She continues to work for the [employer] in [place]; she is the Archivist. She loves helping her clients in the community.    Oncology Flowsheet 08/15/2016  ferumoxytol [facility]) IV 510 mg     REVIEW OF SYSTEMS:  Review of Systems  Constitutional: Positive for fatigue.  Gastrointestinal: Positive for nausea.  Genitourinary: Negative for menstrual problem.   Musculoskeletal: Positive for arthralgias.  Neurological: Positive for dizziness.  Psychiatric/Behavioral: Positive for sleep disturbance.  All other systems reviewed and are negative.    PAST MEDICAL/SURGICAL HISTORY:  Past Medical History:  Diagnosis Date  . Anemia   . B12 deficiency 03/14/2016  . Congenital heart disease   . Depression   . Gastric bypass status for obesity 03/14/2016  . Iron deficiency anemia 03/14/2016  . Presence of IVC filter   . Pulmonary embolism ([place])   . Rheumatoid arthritis ([place])   . Sleep apnea   . Tubular adenoma of colon    Past Surgical History:  Procedure Laterality Date  . BIOPSY N/A 11/19/2014   Procedure: BIOPSY;  Surgeon: Daneil Dolin, MD;  Location: [facility];  Service: Endoscopy;  Laterality: N/A;  Ascending Colon  . BIOPSY  09/22/2016   Procedure: BIOPSY;  Surgeon: Rogene Houston, MD;  Location: [facility] END[facility] SU[facility];  Service: Endoscopy;;  small bowel  . CARPAL TUNNEL RELEASE Bilateral   . CHOLECYSTECTOMY  2009  . COLONOSCOPY  07/2012   transverse colon 5 mm polyp removed  . COLONOSCOPY WITH PROPOFOL N/A 11/19/2014   RMR: mutilple colonic polyps removed as described above. Pancolonic diverticulosis(few).  Status post mucosal biopsy to assess for microscopic coliits. /   . ESOPHAGOGASTRODUODENOSCOPY N/A 09/22/2016   Procedure: ESOPHAGOGASTRODUODENOSCOPY (EGD);  Surgeon: Rogene Houston, MD;  Location: [facility];  Service: Endoscopy;  Laterality: N/A;  1:15  . FOOT SURGERY  2015   left  . GASTRIC BYPASS  2008  . HERNIA  REPAIR    . HIATAL HERNIA REPAIR  2007  . POLYPECTOMY N/A 11/19/2014   Procedure: POLYPECTOMY;  Surgeon: Daneil Dolin, MD;  Location: [facility];  Service: Endoscopy;  Laterality: N/A;  ileocecal valve, Hepatic Flexure, Splenic Flexure, Descending Colon   . TUBAL LIGATION       SOCIAL HISTORY:  Social History   Socioeconomic History  . Marital status: Married    Spouse name: Not on file  . Number of children: 3  . Years of education: Not on file  . Highest education level: Not on file  Social Needs  . Financial resource strain: Not on file  . Food insecurity - worry: Not on file  . Food insecurity - inability: Not on file  . Transportation needs - medical: Not on file  . Transportation needs - non-medical: Not on file  Occupational History  . Not on file  Tobacco Use  . Smoking status: Never Smoker  . Smokeless tobacco: Never Used  Substance and Sexual Activity  . Alcohol use: No    Alcohol/week: 0.0 oz  . Drug use: No  . Sexual activity: Yes    Birth control/protection: Surgical  Other Topics Concern  . Not on file  Social History Narrative   Lives with husband.      FAMILY HISTORY:  Family History  Problem Relation Age of Onset  . HIV Sister   . Hypertension Brother   . Diabetes Brother   . Kidney disease Brother   . Hypertension Brother   . Heart Problems Brother        No details.   . Hypertension Brother   . Colon cancer Neg Hx     CURRENT MEDICATIONS:  Outpatient Encounter Medications as of 10/01/2017  Medication Sig  . Cyanocobalamin (VITAMIN B-12 IJ) Inject as directed every 30 (thirty) days.  Marland Kitchen dicyclomine (BENTYL) 10 MG capsule Take 1 capsule (10 mg total) by mouth 3 (three) times daily before meals. Patient will take 2 two 10 mg capsules by mouth three times a day before a meal.  . DULoxetine (CYMBALTA) 60 MG capsule Take 60 mg by mouth at bedtime.   . folic acid (FOLVITE) 1 MG tablet Take 1 mg by mouth daily.  Marland Kitchen ibuprofen (ADVIL,MOTRIN) 200 MG  tablet Take 400 mg by mouth every 6 (six) hours as needed for headache or moderate pain.  . methotrexate (RHEUMATREX) 2.5 MG tablet Take 15 mg by mouth once a week.  . metoprolol tartrate (LOPRESSOR) 25 MG tablet take 1 tablet by mouth once daily  . modafinil (PROVIGIL) 200 MG tablet take 1 tablet by mouth once daily  . montelukast (SINGULAIR) 10 MG tablet Take 10 mg by mouth at bedtime.  . ondansetron (ZOFRAN) 8 MG tablet Take 8 mg by mouth every 8 (eight) hours as needed for nausea or vomiting.  Marland Kitchen zolpidem (AMBIEN) 5 MG tablet Take 5 mg by mouth at bedtime.   . montelukast (SINGULAIR) 10 MG tablet take 1 tablet  by mouth at bedtime (Patient not taking: Reported on 10/01/2017)   No facility-administered encounter medications on file as of 10/01/2017.     ALLERGIES:  Allergies  Allergen Reactions  . Viberzi [Eluxadoline] Other (See Comments)    Patient states that she has extreme abdominal cramping. Patient states that she has extreme abdominal cramping.     PHYSICAL EXAM:  ECOG Performance status: 1 - Symptomatic; remains independent.   Vitals:   10/01/17 0951  BP: 139/74  Pulse: 66  Resp: 16  Temp: 98.5 F (36.9 C)  SpO2: 98%   Filed Weights   10/01/17 0951  Weight: 268 lb 8 oz (121.8 kg)    Physical Exam  Constitutional: She is oriented to person, place, and time and well-developed, well-nourished, and in no distress.  HENT:  Head: Normocephalic.  Mouth/Throat: Oropharynx is clear and moist. No oropharyngeal exudate.  Eyes: Conjunctivae are normal. Pupils are equal, round, and reactive to light. No scleral icterus.  Neck: Normal range of motion. Neck supple.  Cardiovascular: Normal rate and regular rhythm.  Pulmonary/Chest: Effort normal and breath sounds normal. No respiratory distress.  Abdominal: Soft. Bowel sounds are normal. There is no tenderness.  Musculoskeletal: Normal range of motion. She exhibits edema.  Bilateral wrists/hands mildly erythematous and warm  to touch; pt states this is d/t her RA.   Lymphadenopathy:    She has no cervical adenopathy.       Right: No supraclavicular adenopathy present.       Left: No supraclavicular adenopathy present.  Neurological: She is alert and oriented to person, place, and time. No cranial nerve deficit. Gait normal.  Skin: Skin is warm and dry. No rash noted.  Psychiatric: Mood, memory, affect and judgment normal.  Nursing note and vitals reviewed.    LABORATORY DATA:  I have reviewed the labs as listed.  CBC    Component Value Date/Time   WBC 7.2 10/01/2017 0918   RBC 4.87 10/01/2017 0918   HGB 14.2 10/01/2017 0918   HCT 45.5 10/01/2017 0918   PLT 195 10/01/2017 0918   MCV 93.4 10/01/2017 0918   MCH 29.2 10/01/2017 0918   MCHC 31.2 10/01/2017 0918   RDW 14.7 10/01/2017 0918   LYMPHSABS 1.9 10/01/2017 0918   MONOABS 0.5 10/01/2017 0918   EOSABS 0.1 10/01/2017 0918   BASOSABS 0.0 10/01/2017 0918   CMP Latest Ref Rng & Units 10/01/2017 05/24/2017 01/31/2017  Glucose 65 - 99 mg/dL 96 108(H) 164(H)  BUN 6 - 20 mg/dL 17 15 17   Creatinine 0.44 - 1.00 mg/dL 0.67 0.90 0.82  Sodium 135 - 145 mmol/L 136 140 136  Potassium 3.5 - 5.1 mmol/L 4.0 4.2 3.6  Chloride 101 - 111 mmol/L 103 107 103  CO2 22 - 32 mmol/L 23 25 26   Calcium 8.9 - 10.3 mg/dL 8.8(L) 9.3 8.8(L)  Total Protein 6.5 - 8.1 g/dL - 7.5 7.0  Total Bilirubin 0.3 - 1.2 mg/dL - 0.5 0.4  Alkaline Phos 38 - 126 U/L - 95 98  AST 15 - 41 U/L - 33 46(H)  ALT 14 - 54 U/L - 36 72(H)    PENDING LABS:    DIAGNOSTIC IMAGING:    PATHOLOGY:     ASSESSMENT & PLAN:   Iron deficiency anemia:  -Last ferritin in 11/2016 normal at 186. She has not required IV iron since 08/2016.  -.Most recent iron anemia profile was normal from 05/2017.  Iron labs  from today are normal.  Ferritin trending down was 26  and is 20 today.  Patient may benefit from IV iron in the near future.  She denies continued heavy menstrual bleeding although she did have this  in the past.  Last menstrual cycle was in May 2018.  She continues to not have symptoms  of iron deficiency (I.e. Pica).  Her hemoglobin is normal today at 14.2. She does have fatigue but states this is chronic.  -She is not a good candidate for oral iron given likely malabsorption with history of gastric bypass surgery.  We will continue IV iron when clinically indicated. -Return to cancer center in 3 months with labs. Encouraged her to call us if she begins to experience ice craving and we could bring her in sooner, if needed. She agreed with this plan.   Vitamin B12 deficiency: -Likely secondary to malabsorption s/p gastric bypass surgery.  -Continue monthly vitamin B12 injections at cancer center. Historically, she was not able to self-administer B12 shots at home. We are happy to continue.  B12 today is 207.  Dispo:  -Continue monthly vitamin B12 injections.  -Return to cancer center in 3 months for follow-up with labs.    All questions were answered to patient's stated satisfaction. Encouraged patient to call with any new concerns or questions before her next visit to the cancer center and we can certain see her sooner, if needed.    Orders placed this encounter:  No orders of the defined types were placed in this encounter.   Faythe Casa, NP 10/01/2017 2:31 PM

## 2017-10-01 ENCOUNTER — Encounter (HOSPITAL_BASED_OUTPATIENT_CLINIC_OR_DEPARTMENT_OTHER): Payer: BLUE CROSS/BLUE SHIELD

## 2017-10-01 ENCOUNTER — Encounter (HOSPITAL_COMMUNITY): Payer: BLUE CROSS/BLUE SHIELD | Attending: Adult Health

## 2017-10-01 ENCOUNTER — Encounter (HOSPITAL_COMMUNITY): Payer: Self-pay | Admitting: Oncology

## 2017-10-01 ENCOUNTER — Encounter (HOSPITAL_BASED_OUTPATIENT_CLINIC_OR_DEPARTMENT_OTHER): Payer: BLUE CROSS/BLUE SHIELD | Admitting: Oncology

## 2017-10-01 ENCOUNTER — Other Ambulatory Visit (HOSPITAL_COMMUNITY): Payer: Self-pay | Admitting: Oncology

## 2017-10-01 VITALS — BP 139/74 | HR 66 | Temp 98.5°F | Resp 16 | Wt 268.5 lb

## 2017-10-01 DIAGNOSIS — E538 Deficiency of other specified B group vitamins: Secondary | ICD-10-CM | POA: Diagnosis not present

## 2017-10-01 DIAGNOSIS — Z23 Encounter for immunization: Secondary | ICD-10-CM | POA: Diagnosis not present

## 2017-10-01 DIAGNOSIS — D509 Iron deficiency anemia, unspecified: Secondary | ICD-10-CM

## 2017-10-01 DIAGNOSIS — D508 Other iron deficiency anemias: Secondary | ICD-10-CM

## 2017-10-01 LAB — CBC WITH DIFFERENTIAL/PLATELET
BASOS ABS: 0 10*3/uL (ref 0.0–0.1)
Basophils Relative: 1 %
EOS ABS: 0.1 10*3/uL (ref 0.0–0.7)
EOS PCT: 2 %
HCT: 45.5 % (ref 36.0–46.0)
Hemoglobin: 14.2 g/dL (ref 12.0–15.0)
LYMPHS ABS: 1.9 10*3/uL (ref 0.7–4.0)
Lymphocytes Relative: 26 %
MCH: 29.2 pg (ref 26.0–34.0)
MCHC: 31.2 g/dL (ref 30.0–36.0)
MCV: 93.4 fL (ref 78.0–100.0)
Monocytes Absolute: 0.5 10*3/uL (ref 0.1–1.0)
Monocytes Relative: 6 %
Neutro Abs: 4.7 10*3/uL (ref 1.7–7.7)
Neutrophils Relative %: 65 %
PLATELETS: 195 10*3/uL (ref 150–400)
RBC: 4.87 MIL/uL (ref 3.87–5.11)
RDW: 14.7 % (ref 11.5–15.5)
WBC: 7.2 10*3/uL (ref 4.0–10.5)

## 2017-10-01 LAB — BASIC METABOLIC PANEL
Anion gap: 10 (ref 5–15)
BUN: 17 mg/dL (ref 6–20)
CO2: 23 mmol/L (ref 22–32)
Calcium: 8.8 mg/dL — ABNORMAL LOW (ref 8.9–10.3)
Chloride: 103 mmol/L (ref 101–111)
Creatinine, Ser: 0.67 mg/dL (ref 0.44–1.00)
GFR calc Af Amer: >60 mL/min (ref >60)
Glucose, Bld: 96 mg/dL (ref 65–99)
POTASSIUM: 4 mmol/L (ref 3.5–5.1)
SODIUM: 136 mmol/L (ref 135–145)

## 2017-10-01 LAB — VITAMIN B12: VITAMIN B 12: 207 pg/mL (ref 180–914)

## 2017-10-01 LAB — FERRITIN: Ferritin: 20 ng/mL (ref 11–307)

## 2017-10-01 LAB — IRON AND TIBC
Iron: 84 ug/dL (ref 28–170)
SATURATION RATIOS: 23 % (ref 10.4–31.8)
TIBC: 358 ug/dL (ref 250–450)
UIBC: 274 ug/dL

## 2017-10-01 LAB — FOLATE: FOLATE: 58.2 ng/mL (ref >5.9)

## 2017-10-01 MED ORDER — INFLUENZA VAC SPLIT QUAD 0.5 ML IM SUSY
PREFILLED_SYRINGE | INTRAMUSCULAR | Status: AC
  Filled 2017-10-01: qty 0.5

## 2017-10-01 MED ORDER — CYANOCOBALAMIN 1000 MCG/ML IJ SOLN
1000.0000 ug | Freq: Once | INTRAMUSCULAR | Status: AC
  Administered 2017-10-01: 1000 ug via INTRAMUSCULAR

## 2017-10-01 MED ORDER — CYANOCOBALAMIN 1000 MCG/ML IJ SOLN
INTRAMUSCULAR | Status: AC
  Filled 2017-10-01: qty 1

## 2017-10-01 MED ORDER — INFLUENZA VAC SPLIT QUAD 0.5 ML IM SUSY
0.5000 mL | PREFILLED_SYRINGE | Freq: Once | INTRAMUSCULAR | Status: AC
  Administered 2017-10-01: 0.5 mL via INTRAMUSCULAR

## 2017-10-01 NOTE — Progress Notes (Signed)
Jody Stokes presents today for injection per the provider's orders.  B12 and Fluarix administrations without incident; see MAR for injection details.  Patient tolerated procedure well and without incident.  No questions or complaints noted at this time.  Discharged ambulatory.

## 2017-10-03 MED ORDER — DIPHENHYDRAMINE HCL 25 MG PO CAPS
ORAL_CAPSULE | ORAL | Status: AC
  Filled 2017-10-03: qty 2

## 2017-10-03 MED ORDER — PROCHLORPERAZINE MALEATE 10 MG PO TABS
ORAL_TABLET | ORAL | Status: AC
  Filled 2017-10-03: qty 1

## 2017-10-03 MED ORDER — ACETAMINOPHEN 325 MG PO TABS
ORAL_TABLET | ORAL | Status: AC
  Filled 2017-10-03: qty 2

## 2017-10-04 ENCOUNTER — Other Ambulatory Visit (HOSPITAL_COMMUNITY): Payer: Self-pay | Admitting: Oncology

## 2017-10-04 MED ORDER — CYANOCOBALAMIN 1000 MCG/ML IJ SOLN
INTRAMUSCULAR | Status: AC
Start: 2017-10-04 — End: 2017-10-04
  Filled 2017-10-04: qty 1

## 2017-10-08 ENCOUNTER — Inpatient Hospital Stay (HOSPITAL_COMMUNITY): Payer: BLUE CROSS/BLUE SHIELD | Attending: Oncology

## 2017-10-08 ENCOUNTER — Encounter (HOSPITAL_COMMUNITY): Payer: Self-pay

## 2017-10-08 VITALS — BP 134/81 | HR 62 | Temp 98.4°F | Resp 18 | Wt 266.6 lb

## 2017-10-08 DIAGNOSIS — D509 Iron deficiency anemia, unspecified: Secondary | ICD-10-CM | POA: Insufficient documentation

## 2017-10-08 DIAGNOSIS — E538 Deficiency of other specified B group vitamins: Secondary | ICD-10-CM | POA: Diagnosis not present

## 2017-10-08 MED ORDER — SODIUM CHLORIDE 0.9 % IV SOLN
510.0000 mg | Freq: Once | INTRAVENOUS | Status: AC
  Administered 2017-10-08: 510 mg via INTRAVENOUS
  Filled 2017-10-08: qty 17

## 2017-10-08 MED ORDER — SODIUM CHLORIDE 0.9 % IV SOLN
Freq: Once | INTRAVENOUS | Status: AC
  Administered 2017-10-08: 09:00:00 via INTRAVENOUS

## 2017-10-08 NOTE — Patient Instructions (Signed)
[facility] at [facility] Discharge Instructions  RECOMMENDATIONS MADE BY THE CONSULTANT AND ANY TEST RESULTS WILL BE SENT TO YOUR REFERRING PHYSICIAN.  Received Feraheme infusion today.Follow-up as scheduled. Call clinic for any questions or concerns  Thank you for choosing [facility] at [facility] to provide your oncology and hematology care.  To afford each patient quality time with our provider, please arrive at least 15 minutes before your scheduled appointment time.    If you have a lab appointment with the Cancer Center please come in thru the  Main Entrance and check in at the main information desk  You need to re-schedule your appointment should you arrive 10 or more minutes late.  We strive to give you quality time with our providers, and arriving late affects you and other patients whose appointments are after yours.  Also, if you no show three or more times for appointments you may be dismissed from the clinic at the providers discretion.     Again, thank you for choosing [facility].  Our hope is that these requests will decrease the amount of time that you wait before being seen by our physicians.       _____________________________________________________________  Should you have questions after your visit to [facility], please contact our office at [phone] between the hours of 8:30 a.m. and 4:30 p.m.  Voicemails left after 4:30 p.m. will not be returned until the following business day.  For prescription refill requests, have your pharmacy contact our office.       Resources For Cancer Patients and their Caregivers ? American Cancer Society: Can assist with transportation, wigs, general needs, runs Look Good Feel Better.        1-888-227-6333 ? Cancer Care: Provides financial assistance, online support groups, medication/co-pay assistance.  1-800-813-HOPE (4673) ? [facility] Assists [place] cancer patients and their families through emotional , educational and financial support.  336-427-4357 ? [place] DSS Where to apply for food stamps, Medicaid and utility assistance. 336-342-1394 ? RCATS: Transportation to medical appointments. 336-347-2287 ? Social Security Administration: May apply for disability if have a Stage IV cancer. 336-342-7796 1-800-772-1213 ? [place] Aging, Disability and Transit Services: Assists with nutrition, care and transit needs. 336-349-2343  Cancer Center Support Programs: @[email or web address]@ > Cancer Support Group  2nd Tuesday of the month 1pm-2pm, Journey Room  > Creative Journey  3rd Tuesday of the month 1130am-1pm, Journey Room  > Look Good Feel Better  1st Wednesday of the month 10am-12 noon, Journey Room (Call American Cancer Society to register 1-800-395-5775)   

## 2017-10-08 NOTE — Progress Notes (Signed)
Jody Stokes tolerated Feraheme infusion well without complaints or incident. VSS upon discharge. Pt discharged self ambulatory in satisfactory condition

## 2017-10-09 ENCOUNTER — Encounter: Payer: Self-pay | Admitting: Internal Medicine

## 2017-10-09 DIAGNOSIS — Z79899 Other long term (current) drug therapy: Secondary | ICD-10-CM | POA: Diagnosis not present

## 2017-10-09 DIAGNOSIS — M0609 Rheumatoid arthritis without rheumatoid factor, multiple sites: Secondary | ICD-10-CM | POA: Diagnosis not present

## 2017-10-15 ENCOUNTER — Encounter (HOSPITAL_COMMUNITY): Payer: Self-pay

## 2017-10-15 ENCOUNTER — Inpatient Hospital Stay (HOSPITAL_COMMUNITY): Payer: BLUE CROSS/BLUE SHIELD

## 2017-10-15 DIAGNOSIS — D509 Iron deficiency anemia, unspecified: Secondary | ICD-10-CM | POA: Diagnosis not present

## 2017-10-15 DIAGNOSIS — E538 Deficiency of other specified B group vitamins: Secondary | ICD-10-CM | POA: Diagnosis not present

## 2017-10-15 MED ORDER — CYANOCOBALAMIN 1000 MCG/ML IJ SOLN
1000.0000 ug | Freq: Once | INTRAMUSCULAR | Status: AC
  Administered 2017-10-15: 1000 ug via INTRAMUSCULAR

## 2017-10-15 MED ORDER — CYANOCOBALAMIN 1000 MCG/ML IJ SOLN
INTRAMUSCULAR | Status: AC
  Filled 2017-10-15: qty 1

## 2017-10-15 NOTE — Progress Notes (Signed)
Cyrene C Vondra tolerated Vit B12 injection well without complaints or incident. VSS Pt discharged self ambulatory in satisfactory condition 

## 2017-10-15 NOTE — Patient Instructions (Signed)
[facility] at [facility] Discharge Instructions  RECOMMENDATIONS MADE BY THE CONSULTANT AND ANY TEST RESULTS WILL BE SENT TO YOUR REFERRING PHYSICIAN.  Received Vit B12 injection today. Follow-up as scheduled. Call clinic for any questions or concerns  Thank you for choosing [facility] at [facility] to provide your oncology and hematology care.  To afford each patient quality time with our provider, please arrive at least 15 minutes before your scheduled appointment time.    If you have a lab appointment with the Cancer Center please come in thru the  Main Entrance and check in at the main information desk  You need to re-schedule your appointment should you arrive 10 or more minutes late.  We strive to give you quality time with our providers, and arriving late affects you and other patients whose appointments are after yours.  Also, if you no show three or more times for appointments you may be dismissed from the clinic at the providers discretion.     Again, thank you for choosing [facility].  Our hope is that these requests will decrease the amount of time that you wait before being seen by our physicians.       _____________________________________________________________  Should you have questions after your visit to [facility], please contact our office at [phone] between the hours of 8:30 a.m. and 4:30 p.m.  Voicemails left after 4:30 p.m. will not be returned until the following business day.  For prescription refill requests, have your pharmacy contact our office.       Resources For Cancer Patients and their Caregivers ? American Cancer Society: Can assist with transportation, wigs, general needs, runs Look Good Feel Better.        1-888-227-6333 ? Cancer Care: Provides financial assistance, online support groups, medication/co-pay assistance.  1-800-813-HOPE (4673) ? [facility] Assists [place] cancer patients and their families through emotional , educational and financial support.  336-427-4357 ? [place] DSS Where to apply for food stamps, Medicaid and utility assistance. 336-342-1394 ? RCATS: Transportation to medical appointments. 336-347-2287 ? Social Security Administration: May apply for disability if have a Stage IV cancer. 336-342-7796 1-800-772-1213 ? [place] Aging, Disability and Transit Services: Assists with nutrition, care and transit needs. 336-349-2343  Cancer Center Support Programs: @10RELATIVEDAYS@ > Cancer Support Group  2nd Tuesday of the month 1pm-2pm, Journey Room  > Creative Journey  3rd Tuesday of the month 1130am-1pm, Journey Room  > Look Good Feel Better  1st Wednesday of the month 10am-12 noon, Journey Room (Call American Cancer Society to register 1-800-395-5775)   

## 2017-10-29 ENCOUNTER — Ambulatory Visit (HOSPITAL_COMMUNITY): Payer: BLUE CROSS/BLUE SHIELD

## 2017-10-29 ENCOUNTER — Inpatient Hospital Stay (HOSPITAL_COMMUNITY): Payer: BLUE CROSS/BLUE SHIELD

## 2017-10-29 ENCOUNTER — Encounter (HOSPITAL_COMMUNITY): Payer: Self-pay

## 2017-10-29 VITALS — BP 118/79 | HR 69 | Temp 98.1°F | Resp 20

## 2017-10-29 DIAGNOSIS — M0609 Rheumatoid arthritis without rheumatoid factor, multiple sites: Secondary | ICD-10-CM | POA: Diagnosis not present

## 2017-10-29 DIAGNOSIS — M797 Fibromyalgia: Secondary | ICD-10-CM | POA: Diagnosis not present

## 2017-10-29 DIAGNOSIS — E538 Deficiency of other specified B group vitamins: Secondary | ICD-10-CM | POA: Diagnosis not present

## 2017-10-29 DIAGNOSIS — D509 Iron deficiency anemia, unspecified: Secondary | ICD-10-CM | POA: Diagnosis not present

## 2017-10-29 MED ORDER — CYANOCOBALAMIN 1000 MCG/ML IJ SOLN
1000.0000 ug | Freq: Once | INTRAMUSCULAR | Status: AC
  Administered 2017-10-29: 1000 ug via INTRAMUSCULAR
  Filled 2017-10-29: qty 1

## 2017-10-29 NOTE — Patient Instructions (Signed)
[facility] at [facility] Discharge Instructions  RECOMMENDATIONS MADE BY THE CONSULTANT AND ANY TEST RESULTS WILL BE SENT TO YOUR REFERRING PHYSICIAN.  Received Vit B12 injection today. Follow-up as scheduled. Call clinic for any questions or concerns  Thank you for choosing [facility] at [facility] to provide your oncology and hematology care.  To afford each patient quality time with our provider, please arrive at least 15 minutes before your scheduled appointment time.    If you have a lab appointment with the Cancer Center please come in thru the  Main Entrance and check in at the main information desk  You need to re-schedule your appointment should you arrive 10 or more minutes late.  We strive to give you quality time with our providers, and arriving late affects you and other patients whose appointments are after yours.  Also, if you no show three or more times for appointments you may be dismissed from the clinic at the providers discretion.     Again, thank you for choosing [facility].  Our hope is that these requests will decrease the amount of time that you wait before being seen by our physicians.       _____________________________________________________________  Should you have questions after your visit to [facility], please contact our office at [phone] between the hours of 8:30 a.m. and 4:30 p.m.  Voicemails left after 4:30 p.m. will not be returned until the following business day.  For prescription refill requests, have your pharmacy contact our office.       Resources For Cancer Patients and their Caregivers ? American Cancer Society: Can assist with transportation, wigs, general needs, runs Look Good Feel Better.        1-888-227-6333 ? Cancer Care: Provides financial assistance, online support groups, medication/co-pay assistance.  1-800-813-HOPE (4673) ? [facility] Assists [place] cancer patients and their families through emotional , educational and financial support.  336-427-4357 ? [place] DSS Where to apply for food stamps, Medicaid and utility assistance. 336-342-1394 ? RCATS: Transportation to medical appointments. 336-347-2287 ? Social Security Administration: May apply for disability if have a Stage IV cancer. 336-342-7796 1-800-772-1213 ? [place] Aging, Disability and Transit Services: Assists with nutrition, care and transit needs. 336-349-2343  Cancer Center Support Programs: @10RELATIVEDAYS@ > Cancer Support Group  2nd Tuesday of the month 1pm-2pm, Journey Room  > Creative Journey  3rd Tuesday of the month 1130am-1pm, Journey Room  > Look Good Feel Better  1st Wednesday of the month 10am-12 noon, Journey Room (Call American Cancer Society to register 1-800-395-5775)   

## 2017-10-29 NOTE — Progress Notes (Signed)
Jody Stokes Dieterich tolerated Vit B12 injection well without complaints or incident. VSS Pt discharged self ambulatory in satisfactory condition accompanied by her husband

## 2017-11-02 DIAGNOSIS — M545 Low back pain: Secondary | ICD-10-CM | POA: Diagnosis not present

## 2017-11-02 DIAGNOSIS — M62830 Muscle spasm of back: Secondary | ICD-10-CM | POA: Diagnosis not present

## 2017-11-02 DIAGNOSIS — Z6839 Body mass index (BMI) 39.0-39.9, adult: Secondary | ICD-10-CM | POA: Diagnosis not present

## 2017-11-12 DIAGNOSIS — M545 Low back pain: Secondary | ICD-10-CM | POA: Diagnosis not present

## 2017-11-16 DIAGNOSIS — M62830 Muscle spasm of back: Secondary | ICD-10-CM | POA: Diagnosis not present

## 2017-11-16 DIAGNOSIS — M545 Low back pain: Secondary | ICD-10-CM | POA: Diagnosis not present

## 2017-11-26 ENCOUNTER — Ambulatory Visit (HOSPITAL_COMMUNITY): Payer: BLUE CROSS/BLUE SHIELD

## 2017-11-27 ENCOUNTER — Encounter (INDEPENDENT_AMBULATORY_CARE_PROVIDER_SITE_OTHER): Payer: Self-pay | Admitting: *Deleted

## 2017-12-02 DIAGNOSIS — H8309 Labyrinthitis, unspecified ear: Secondary | ICD-10-CM | POA: Diagnosis not present

## 2017-12-02 DIAGNOSIS — R42 Dizziness and giddiness: Secondary | ICD-10-CM | POA: Diagnosis not present

## 2017-12-02 DIAGNOSIS — M797 Fibromyalgia: Secondary | ICD-10-CM | POA: Diagnosis not present

## 2017-12-02 DIAGNOSIS — Z79899 Other long term (current) drug therapy: Secondary | ICD-10-CM | POA: Diagnosis not present

## 2017-12-03 ENCOUNTER — Ambulatory Visit (INDEPENDENT_AMBULATORY_CARE_PROVIDER_SITE_OTHER): Payer: BLUE CROSS/BLUE SHIELD

## 2017-12-03 ENCOUNTER — Encounter: Payer: Self-pay | Admitting: Podiatry

## 2017-12-03 ENCOUNTER — Ambulatory Visit: Payer: BLUE CROSS/BLUE SHIELD

## 2017-12-03 ENCOUNTER — Ambulatory Visit: Payer: BLUE CROSS/BLUE SHIELD | Admitting: Podiatry

## 2017-12-03 VITALS — BP 138/82 | HR 64 | Resp 16

## 2017-12-03 DIAGNOSIS — M722 Plantar fascial fibromatosis: Secondary | ICD-10-CM | POA: Diagnosis not present

## 2017-12-03 DIAGNOSIS — M25571 Pain in right ankle and joints of right foot: Secondary | ICD-10-CM

## 2017-12-03 NOTE — Patient Instructions (Signed)

## 2017-12-03 NOTE — Progress Notes (Signed)
Subjective: Jody Stokes presents the office today with her husband for concerns of pain to the bottom and outside aspect of her heels. This is been ongoing for about 2 months.  She feels that her inserts are worn out and she needs a new pair.  She is going to [place] later this week she is requesting steroid injections into her heels.  She denies any recent injury or trauma. Denies any swelling or redness.  She has no new concerns today.  She is been doing well otherwise and up until 2 months ago she is able to walk and have no foot pain.  She states that the surgery is still doing very well and that she had a prior surgery has resolved.  Denies any systemic complaints such as fevers, chills, nausea, vomiting. No acute changes since last appointment, and no other complaints at this time.   Objective: AAO x3, NAD DP/PT pulses palpable bilaterally, CRT less than 3 seconds There is to palpation along the plantar lateral aspect of the calcaneus at the insertion of plantar fascia on the right > left foot. There is no pain along the course of the plantar fascia within the arch of the foot. Plantar fascia appears to be intact. There is no pain with lateral compression of the calcaneus or pain with vibratory sensation. There is no pain along the course or insertion of the achilles tendon. No other areas of tenderness to bilateral lower extremities. No open lesions or pre-ulcerative lesions.  Negative tinel sign No pain with calf compression, swelling, warmth, erythema  Assessment: Bilateral heel pain likely lateral band plantar fasciitis  Plan: -All treatment options discussed with the patient including all alternatives, risks, complications.  -X-rays were obtained and reviewed.  No evidence of acute fracture.  Spurring is present. -She is requesting steroid injections.  Was warm today.  See procedure note below. -Stretching, icing exercises daily. -Follow-up in 4 weeks or sooner if needed.  Call any  questions or concerns.  Procedure: Injection Tendon/Ligament Discussed alternatives, risks, complications and verbal consent was obtained.  Location: Bilateral plantar fascia at the glabrous junction; lateral approach. Skin Prep: Alcohol. Injectate: 0.5 cc 0.5% marcaine plain, 0.5 cc 0.5% Marcaine plain and, 1 cc kenalog 10. Disposition: Patient tolerated procedure well. Injection site dressed with a band-aid.  Post-injection care was discussed and return precautions discussed.   Trula Slade DPM

## 2017-12-04 DIAGNOSIS — Z79899 Other long term (current) drug therapy: Secondary | ICD-10-CM | POA: Diagnosis not present

## 2017-12-04 DIAGNOSIS — M0609 Rheumatoid arthritis without rheumatoid factor, multiple sites: Secondary | ICD-10-CM | POA: Diagnosis not present

## 2017-12-21 ENCOUNTER — Other Ambulatory Visit (HOSPITAL_COMMUNITY): Payer: Self-pay

## 2017-12-21 DIAGNOSIS — E538 Deficiency of other specified B group vitamins: Secondary | ICD-10-CM

## 2017-12-21 DIAGNOSIS — D509 Iron deficiency anemia, unspecified: Secondary | ICD-10-CM

## 2017-12-24 ENCOUNTER — Inpatient Hospital Stay (HOSPITAL_COMMUNITY): Payer: BLUE CROSS/BLUE SHIELD

## 2017-12-24 ENCOUNTER — Inpatient Hospital Stay (HOSPITAL_COMMUNITY): Payer: BLUE CROSS/BLUE SHIELD | Attending: Oncology | Admitting: Internal Medicine

## 2017-12-24 ENCOUNTER — Encounter (HOSPITAL_COMMUNITY): Payer: Self-pay | Admitting: Internal Medicine

## 2017-12-24 VITALS — BP 129/72 | HR 59 | Temp 98.6°F | Resp 16 | Wt 259.4 lb

## 2017-12-24 DIAGNOSIS — D509 Iron deficiency anemia, unspecified: Secondary | ICD-10-CM | POA: Insufficient documentation

## 2017-12-24 DIAGNOSIS — E538 Deficiency of other specified B group vitamins: Secondary | ICD-10-CM

## 2017-12-24 DIAGNOSIS — D508 Other iron deficiency anemias: Secondary | ICD-10-CM

## 2017-12-24 DIAGNOSIS — Z9884 Bariatric surgery status: Secondary | ICD-10-CM | POA: Insufficient documentation

## 2017-12-24 LAB — FOLATE: FOLATE: 14 ng/mL (ref >5.9)

## 2017-12-24 LAB — CBC WITH DIFFERENTIAL/PLATELET
BASOS ABS: 0.1 10*3/uL (ref 0.0–0.1)
Basophils Relative: 1 %
Eosinophils Absolute: 0.2 10*3/uL (ref 0.0–0.7)
Eosinophils Relative: 3 %
HEMATOCRIT: 43 % (ref 36.0–46.0)
Hemoglobin: 13.4 g/dL (ref 12.0–15.0)
LYMPHS PCT: 24 %
Lymphs Abs: 1.4 10*3/uL (ref 0.7–4.0)
MCH: 29.6 pg (ref 26.0–34.0)
MCHC: 31.2 g/dL (ref 30.0–36.0)
MCV: 95.1 fL (ref 78.0–100.0)
MONO ABS: 0.6 10*3/uL (ref 0.1–1.0)
Monocytes Relative: 11 %
NEUTROS ABS: 3.5 10*3/uL (ref 1.7–7.7)
Neutrophils Relative %: 61 %
Platelets: 151 10*3/uL (ref 150–400)
RBC: 4.52 MIL/uL (ref 3.87–5.11)
RDW: 14.7 % (ref 11.5–15.5)
WBC: 5.8 10*3/uL (ref 4.0–10.5)

## 2017-12-24 LAB — FERRITIN: Ferritin: 120 ng/mL (ref 11–307)

## 2017-12-24 LAB — COMPREHENSIVE METABOLIC PANEL
ALT: 29 U/L (ref 14–54)
AST: 29 U/L (ref 15–41)
Albumin: 3.7 g/dL (ref 3.5–5.0)
Alkaline Phosphatase: 110 U/L (ref 38–126)
Anion gap: 9 (ref 5–15)
BILIRUBIN TOTAL: 0.9 mg/dL (ref 0.3–1.2)
BUN: 22 mg/dL — AB (ref 6–20)
CALCIUM: 8.5 mg/dL — AB (ref 8.9–10.3)
CO2: 24 mmol/L (ref 22–32)
CREATININE: 0.72 mg/dL (ref 0.44–1.00)
Chloride: 105 mmol/L (ref 101–111)
GFR calc Af Amer: >60 mL/min (ref >60)
Glucose, Bld: 105 mg/dL — ABNORMAL HIGH (ref 65–99)
Potassium: 4.1 mmol/L (ref 3.5–5.1)
Sodium: 138 mmol/L (ref 135–145)
TOTAL PROTEIN: 6.7 g/dL (ref 6.5–8.1)

## 2017-12-24 LAB — IRON AND TIBC
IRON: 107 ug/dL (ref 28–170)
Saturation Ratios: 35 % — ABNORMAL HIGH (ref 10.4–31.8)
TIBC: 308 ug/dL (ref 250–450)
UIBC: 201 ug/dL

## 2017-12-24 LAB — VITAMIN B12: Vitamin B-12: 279 pg/mL (ref 180–914)

## 2017-12-24 MED ORDER — CYANOCOBALAMIN 1000 MCG/ML IJ SOLN
INTRAMUSCULAR | Status: AC
  Filled 2017-12-24: qty 1

## 2017-12-24 MED ORDER — CYANOCOBALAMIN 1000 MCG/ML IJ SOLN
1000.0000 ug | Freq: Once | INTRAMUSCULAR | Status: AC
  Administered 2017-12-24: 1000 ug via INTRAMUSCULAR

## 2017-12-24 NOTE — Progress Notes (Signed)
Diagnosis Other iron deficiency anemia - Plan: CBC with Differential/Platelet, Comprehensive metabolic panel, Lactate dehydrogenase, Ferritin  Staging Cancer Staging No matching staging information was found for the patient.  REASON FOR VISIT: Follow-up for Iron deficiency anemia AND vitamin B12 deficiency secondary to malabsorption s/p bariatric surgery in 2008  Assessment and plan: 1.  Iron deficiency anemia.  Patient was last treated with IV iron in January 2019.  Hemoglobin 13.4.  Iron studies are pending.  She is reporting some problem with hemorrhoids.  She will be referred for GI evaluation.  Pending iron results she will return to clinic in 4 months for repeat labs.  2.  B12 deficiency.  She remains on monthly B12.  Follow-up with GI as recommended.  She will receive B12 injection today.    3.  Hemorrhoids.  She is referred to Dr. Rehman for follow-up.    4.  Health maintenance.  She reports she is due for mammogram and will schedule that in the evening.  5.  Bulging disc.  She reports she will schedule to be seen by neurosurgery.  CURRENT THERAPY: IV iron prn AND monthly vitamin B12 injections   INTERVAL HISTORY: 56 y.o. Female previously followed by Dr. Zhou for iron deficiency anemia and vitamin B12 deficiency. She was last treated with IV iron 10/2017.    Current status: Patient is seen today for follow-up.  She reports problems with hemorrhoids.  She also has a bulging disc.  She recently traveled to [place] 3/ 8 through 3/18 on a mission trip.    Problem List Patient Active Problem List   Diagnosis Date Noted  . Tarsal tunnel syndrome [G57.50] 01/31/2017  . Abdominal pain, chronic, epigastric [R10.13, G89.29] 08/28/2016  . B12 deficiency [E53.8] 03/14/2016  . Iron deficiency anemia [D50.9] 03/14/2016  . Gastric bypass status for obesity [Z98.84] 03/14/2016  . Plantar fasciitis of right foot [M72.2] 10/04/2015  . Plantar fasciitis of left foot [M72.2] 09/15/2015  .  Plantar fasciitis [M72.2] 08/12/2015  . Heel spur [M77.30] 08/12/2015  . Fatigue [R53.83] 03/23/2015  . History of colonic polyps [Z86.010]   . Diverticulosis of colon without hemorrhage [K57.30]   . History of adenomatous polyp of colon [Z86.010] 11/09/2014  . Diarrhea [R19.7] 11/09/2014  . Abdominal pain [R10.9] 11/09/2014    Past Medical History Past Medical History:  Diagnosis Date  . Anemia   . B12 deficiency 03/14/2016  . Congenital heart disease   . Depression   . Gastric bypass status for obesity 03/14/2016  . Iron deficiency anemia 03/14/2016  . Presence of IVC filter   . Pulmonary embolism (HCC)   . Rheumatoid arthritis (HCC)   . Sleep apnea   . Tubular adenoma of colon     Past Surgical History Past Surgical History:  Procedure Laterality Date  . BIOPSY N/A 11/19/2014   Procedure: BIOPSY;  Surgeon: Robert M Rourk, MD;  Location: [facility];  Service: Endoscopy;  Laterality: N/A;  Ascending Colon  . BIOPSY  09/22/2016   Procedure: BIOPSY;  Surgeon: Najeeb U Rehman, MD;  Location: [facility];  Service: Endoscopy;;  small bowel  . CARPAL TUNNEL RELEASE Bilateral   . CHOLECYSTECTOMY  2009  . COLONOSCOPY  07/2012   transverse colon 5 mm polyp removed  . COLONOSCOPY WITH PROPOFOL N/A 11/19/2014   RMR: mutilple colonic polyps removed as described above. Pancolonic diverticulosis(few).  Status post mucosal biopsy to assess for microscopic coliits. /   . ESOPHAGOGASTRODUODENOSCOPY N/A 09/22/2016   Procedure: ESOPHAGOGASTRODUODENOSCOPY (EGD);  Surgeon:   Rogene Houston, MD;  Location: [facility];  Service: Endoscopy;  Laterality: N/A;  1:15  . FOOT SURGERY  2015   left  . GASTRIC BYPASS  2008  . HERNIA REPAIR    . HIATAL HERNIA REPAIR  2007  . POLYPECTOMY N/A 11/19/2014   Procedure: POLYPECTOMY;  Surgeon: Daneil Dolin, MD;  Location: [facility];  Service: Endoscopy;  Laterality: N/A;  ileocecal valve, Hepatic Flexure, Splenic Flexure, Descending Colon   . TUBAL LIGATION       Family History Family History  Problem Relation Age of Onset  . HIV Sister   . Hypertension Brother   . Diabetes Brother   . Kidney disease Brother   . Hypertension Brother   . Heart Problems Brother        No details.   . Hypertension Brother   . Colon cancer Neg Hx      Social History  reports that she has never smoked. She has never used smokeless tobacco. She reports that she does not drink alcohol or use drugs.  Medications  Current Outpatient Medications:  .  atovaquone-proguanil (MALARONE) 250-100 MG TABS tablet, take 1 tablet by mouth once daily **BEGIN 2 DAYS BEFORE PREARRIVA...  (REFER TO PRESCRIPTION NOTES)., Disp: , Rfl: 0 .  Cyanocobalamin (VITAMIN B-12 IJ), Inject as directed every 30 (thirty) days., Disp: , Rfl:  .  diazepam (VALIUM) 5 MG tablet, TAKE 1 TABLET BY MOUTH EVERY 8 HOURS IF NEEDED FOR DIZZINESS, Disp: , Rfl: 0 .  dicyclomine (BENTYL) 10 MG capsule, Take 1 capsule (10 mg total) by mouth 3 (three) times daily before meals. Patient will take 2 two 10 mg capsules by mouth three times a day before a meal., Disp: 180 capsule, Rfl: 4 .  DULoxetine (CYMBALTA) 60 MG capsule, Take 60 mg by mouth at bedtime. , Disp: , Rfl: 0 .  ibuprofen (ADVIL,MOTRIN) 200 MG tablet, Take 400 mg by mouth every 6 (six) hours as needed for headache or moderate pain., Disp: , Rfl:  .  leflunomide (ARAVA) 20 MG tablet, TK 1 T PO ONCE D., Disp: , Rfl: 2 .  metoprolol tartrate (LOPRESSOR) 25 MG tablet, take 1 tablet by mouth once daily, Disp: 30 tablet, Rfl: 6 .  modafinil (PROVIGIL) 200 MG tablet, take 1 tablet by mouth once daily, Disp: , Rfl:  .  montelukast (SINGULAIR) 10 MG tablet, Take 10 mg by mouth at bedtime., Disp: , Rfl:  .  traMADol (ULTRAM) 50 MG tablet, TK 1 T PO QID PRN, Disp: , Rfl: 0 .  zolpidem (AMBIEN) 5 MG tablet, Take 5 mg by mouth at bedtime. , Disp: , Rfl: 0  Allergies Viberzi [eluxadoline]  Review of Systems Review of Systems - Oncology ROS as per HPI  otherwise 12 point ROS is negative except for hemorrhoids, bulging disc.     Physical Exam  Vitals Wt Readings from Last 3 Encounters:  12/24/17 259 lb 6.4 oz (117.7 kg)  10/15/17 262 lb (118.8 kg)  10/08/17 266 lb 9.6 oz (120.9 kg)   Temp Readings from Last 3 Encounters:  12/24/17 98.6 F (37 C) (Oral)  10/29/17 98.1 F (36.7 C) (Oral)  10/15/17 98.6 F (37 C) (Oral)   BP Readings from Last 3 Encounters:  12/24/17 129/72  12/03/17 138/82  10/29/17 118/79   Pulse Readings from Last 3 Encounters:  12/24/17 (!) 59  12/03/17 64  10/29/17 69   Constitutional: Well-developed, well-nourished, and in no distress.   HENT: Head: Normocephalic  and atraumatic.  Mouth/Throat: No oropharyngeal exudate. Mucosa moist. Eyes: Pupils are equal, round, and reactive to light. Conjunctivae are normal. No scleral icterus.  Neck: Normal range of motion. Neck supple. No JVD present.  Cardiovascular: Normal rate, regular rhythm and normal heart sounds.  Exam reveals no gallop and no friction rub.   No murmur heard. Pulmonary/Chest: Effort normal and breath sounds normal. No respiratory distress. No wheezes.No rales.  Abdominal: Soft. Bowel sounds are normal. No distension. There is no tenderness. There is no guarding.  Musculoskeletal: No edema or tenderness.  Lymphadenopathy: No cervical, axillary or supraclavicular adenopathy.  Neurological: Alert and oriented to person, place, and time. No cranial nerve deficit.  Skin: Skin is warm and dry. No rash noted. No erythema. No pallor.  Psychiatric: Affect and judgment normal.   Labs Appointment on 12/24/2017  Component Date Value Ref Range Status  . WBC 12/24/2017 5.8  4.0 - 10.5 K/uL Final  . RBC 12/24/2017 4.52  3.87 - 5.11 MIL/uL Final  . Hemoglobin 12/24/2017 13.4  12.0 - 15.0 g/dL Final  . HCT 12/24/2017 43.0  36.0 - 46.0 % Final  . MCV 12/24/2017 95.1  78.0 - 100.0 fL Final  . MCH 12/24/2017 29.6  26.0 - 34.0 pg Final  . MCHC  12/24/2017 31.2  30.0 - 36.0 g/dL Final  . RDW 12/24/2017 14.7  11.5 - 15.5 % Final  . Platelets 12/24/2017 151  150 - 400 K/uL Final  . Neutrophils Relative % 12/24/2017 61  % Final  . Neutro Abs 12/24/2017 3.5  1.7 - 7.7 K/uL Final  . Lymphocytes Relative 12/24/2017 24  % Final  . Lymphs Abs 12/24/2017 1.4  0.7 - 4.0 K/uL Final  . Monocytes Relative 12/24/2017 11  % Final  . Monocytes Absolute 12/24/2017 0.6  0.1 - 1.0 K/uL Final  . Eosinophils Relative 12/24/2017 3  % Final  . Eosinophils Absolute 12/24/2017 0.2  0.0 - 0.7 K/uL Final  . Basophils Relative 12/24/2017 1  % Final  . Basophils Absolute 12/24/2017 0.1  0.0 - 0.1 K/uL Final   Performed at [facility], [address] [place],  [address]  . Sodium 12/24/2017 138  135 - 145 mmol/L Final  . Potassium 12/24/2017 4.1  3.5 - 5.1 mmol/L Final  . Chloride 12/24/2017 105  101 - 111 mmol/L Final  . CO2 12/24/2017 24  22 - 32 mmol/L Final  . Glucose, Bld 12/24/2017 105* 65 - 99 mg/dL Final  . BUN 12/24/2017 22* 6 - 20 mg/dL Final  . Creatinine, Ser 12/24/2017 0.72  0.44 - 1.00 mg/dL Final  . Calcium 12/24/2017 8.5* 8.9 - 10.3 mg/dL Final  . Total Protein 12/24/2017 6.7  6.5 - 8.1 g/dL Final  . Albumin 12/24/2017 3.7  3.5 - 5.0 g/dL Final  . AST 12/24/2017 29  15 - 41 U/L Final  . ALT 12/24/2017 29  14 - 54 U/L Final  . Alkaline Phosphatase 12/24/2017 110  38 - 126 U/L Final  . Total Bilirubin 12/24/2017 0.9  0.3 - 1.2 mg/dL Final  . GFR calc non Af Amer 12/24/2017 >60  >60 mL/min Final  . GFR calc Af Amer 12/24/2017 >60  >60 mL/min Final   Comment: (NOTE) The eGFR has been calculated using the CKD EPI equation. This calculation has not been validated in all clinical situations. eGFR's persistently <60 mL/min signify possible Chronic Kidney Disease.   . Anion gap 12/24/2017 9  5 - 15 Final   Performed at [facility], [address], [place], [place] [address]     Pathology Orders Placed This Encounter  Procedures   . CBC with Differential/Platelet    Standing Status:   Future    Standing Expiration Date:   12/25/2018  . Comprehensive metabolic panel    Standing Status:   Future    Standing Expiration Date:   12/25/2018  . Lactate dehydrogenase    Standing Status:   Future    Standing Expiration Date:   12/25/2018  . Ferritin    Standing Status:   Future    Standing Expiration Date:   12/25/2018       Zoila Shutter MD

## 2017-12-24 NOTE — Patient Instructions (Signed)
[facility] at [facility] Discharge Instructions  Received Vit B12 injection today. Follow-up as scheduled. Call clinic for any questions or concerns   Thank you for choosing [facility] at [facility] to provide your oncology and hematology care.  To afford each patient quality time with our provider, please arrive at least 15 minutes before your scheduled appointment time.   If you have a lab appointment with the Cancer Center please come in thru the  Main Entrance and check in at the main information desk  You need to re-schedule your appointment should you arrive 10 or more minutes late.  We strive to give you quality time with our providers, and arriving late affects you and other patients whose appointments are after yours.  Also, if you no show three or more times for appointments you may be dismissed from the clinic at the providers discretion.     Again, thank you for choosing [facility].  Our hope is that these requests will decrease the amount of time that you wait before being seen by our physicians.       _____________________________________________________________  Should you have questions after your visit to [facility], please contact our office at [phone] between the hours of 8:30 a.m. and 4:30 p.m.  Voicemails left after 4:30 p.m. will not be returned until the following business day.  For prescription refill requests, have your pharmacy contact our office.       Resources For Cancer Patients and their Caregivers ? American Cancer Society: Can assist with transportation, wigs, general needs, runs Look Good Feel Better.        1-888-227-6333 ? Cancer Care: Provides financial assistance, online support groups, medication/co-pay assistance.  1-800-813-HOPE (4673) ? [facility] Assists [place] cancer patients and their families through emotional , educational and  financial support.  336-427-4357 ? [place] DSS Where to apply for food stamps, Medicaid and utility assistance. 336-342-1394 ? RCATS: Transportation to medical appointments. 336-347-2287 ? Social Security Administration: May apply for disability if have a Stage IV cancer. 336-342-7796 1-800-772-1213 ? [place] Aging, Disability and Transit Services: Assists with nutrition, care and transit needs. 336-349-2343  Cancer Center Support Programs:   > Cancer Support Group  2nd Tuesday of the month 1pm-2pm, Journey Room   > Creative Journey  3rd Tuesday of the month 1130am-1pm, Journey Room    

## 2017-12-24 NOTE — Patient Instructions (Addendum)
[place] at [facility] Discharge Instructions  You were seen today by Dr. Walden Field. She went over your recent lab work and everything looked good. She discussed referring you back to [name] for your hemorrhoids. We will see you in 4 months for labs and follow up.   Thank you for choosing [place] at [facility] to provide your oncology and hematology care.  To afford each patient quality time with our provider, please arrive at least 15 minutes before your scheduled appointment time.   If you have a lab appointment with the [facility] please come in thru the  Main Entrance and check in at the main information desk  You need to re-schedule your appointment should you arrive 10 or more minutes late.  We strive to give you quality time with our providers, and arriving late affects you and other patients whose appointments are after yours.  Also, if you no show three or more times for appointments you may be dismissed from the clinic at the providers discretion.     Again, thank you for choosing [facility].  Our hope is that these requests will decrease the amount of time that you wait before being seen by our physicians.       _____________________________________________________________  Should you have questions after your visit to [facility], please contact our office at [phone] between the hours of 8:30 a.m. and 4:30 p.m.  Voicemails left after 4:30 p.m. will not be returned until the following business day.  For prescription refill requests, have your pharmacy contact our office.       Resources For Cancer Patients and their Caregivers ? American Cancer Society: Can assist with transportation, wigs, general needs, runs Look Good Feel Better.        (706) 654-9596 ? Cancer Care: Provides financial assistance, online support groups, medication/co-pay assistance.  1-800-813-HOPE 907-491-1030) ? [facility] Assists [place] cancer patients and their families through emotional , educational and financial support.  308 766 2562 ? [place] DSS Where to apply for food stamps, Medicaid and utility assistance. 828 001 7350 ? RCATS: Transportation to medical appointments. 984 236 9174 ? Social Security Administration: May apply for disability if have a Stage IV cancer. (902)029-7014 (203) 814-9889 ? [employer], Disability and Transit Services: Assists with nutrition, care and transit needs. [place] Support Programs:   > Cancer Support Group  2nd Tuesday of the month 1pm-2pm, Journey Room   > Creative Journey  3rd Tuesday of the month 1130am-1pm, Journey Room

## 2017-12-24 NOTE — Progress Notes (Signed)
Jody Stokes tolerated Vit B12 injection well without complaints or incident. VSS Pt discharged self ambulatory in satisfactory condition 

## 2017-12-31 DIAGNOSIS — M797 Fibromyalgia: Secondary | ICD-10-CM | POA: Diagnosis not present

## 2017-12-31 DIAGNOSIS — M5126 Other intervertebral disc displacement, lumbar region: Secondary | ICD-10-CM | POA: Diagnosis not present

## 2017-12-31 DIAGNOSIS — M0609 Rheumatoid arthritis without rheumatoid factor, multiple sites: Secondary | ICD-10-CM | POA: Diagnosis not present

## 2017-12-31 DIAGNOSIS — G4733 Obstructive sleep apnea (adult) (pediatric): Secondary | ICD-10-CM | POA: Diagnosis not present

## 2018-01-02 DIAGNOSIS — Z6841 Body Mass Index (BMI) 40.0 and over, adult: Secondary | ICD-10-CM | POA: Diagnosis not present

## 2018-01-02 DIAGNOSIS — K642 Third degree hemorrhoids: Secondary | ICD-10-CM | POA: Diagnosis not present

## 2018-01-07 ENCOUNTER — Ambulatory Visit: Payer: BLUE CROSS/BLUE SHIELD | Admitting: Podiatry

## 2018-01-11 DIAGNOSIS — M5441 Lumbago with sciatica, right side: Secondary | ICD-10-CM | POA: Diagnosis not present

## 2018-01-11 DIAGNOSIS — M542 Cervicalgia: Secondary | ICD-10-CM | POA: Diagnosis not present

## 2018-01-11 DIAGNOSIS — M5442 Lumbago with sciatica, left side: Secondary | ICD-10-CM | POA: Diagnosis not present

## 2018-01-11 DIAGNOSIS — G8929 Other chronic pain: Secondary | ICD-10-CM | POA: Diagnosis not present

## 2018-01-15 DIAGNOSIS — M62830 Muscle spasm of back: Secondary | ICD-10-CM | POA: Diagnosis not present

## 2018-01-15 DIAGNOSIS — Z6839 Body mass index (BMI) 39.0-39.9, adult: Secondary | ICD-10-CM | POA: Diagnosis not present

## 2018-01-15 DIAGNOSIS — M545 Low back pain: Secondary | ICD-10-CM | POA: Diagnosis not present

## 2018-01-21 DIAGNOSIS — M546 Pain in thoracic spine: Secondary | ICD-10-CM | POA: Diagnosis not present

## 2018-01-21 DIAGNOSIS — S134XXA Sprain of ligaments of cervical spine, initial encounter: Secondary | ICD-10-CM | POA: Diagnosis not present

## 2018-01-21 DIAGNOSIS — S338XXA Sprain of other parts of lumbar spine and pelvis, initial encounter: Secondary | ICD-10-CM | POA: Diagnosis not present

## 2018-01-23 DIAGNOSIS — S134XXA Sprain of ligaments of cervical spine, initial encounter: Secondary | ICD-10-CM | POA: Diagnosis not present

## 2018-01-23 DIAGNOSIS — S338XXA Sprain of other parts of lumbar spine and pelvis, initial encounter: Secondary | ICD-10-CM | POA: Diagnosis not present

## 2018-01-23 DIAGNOSIS — M546 Pain in thoracic spine: Secondary | ICD-10-CM | POA: Diagnosis not present

## 2018-01-25 ENCOUNTER — Ambulatory Visit (HOSPITAL_COMMUNITY): Payer: BLUE CROSS/BLUE SHIELD

## 2018-01-28 ENCOUNTER — Ambulatory Visit (HOSPITAL_COMMUNITY): Payer: BLUE CROSS/BLUE SHIELD

## 2018-01-28 DIAGNOSIS — S134XXA Sprain of ligaments of cervical spine, initial encounter: Secondary | ICD-10-CM | POA: Diagnosis not present

## 2018-01-28 DIAGNOSIS — M546 Pain in thoracic spine: Secondary | ICD-10-CM | POA: Diagnosis not present

## 2018-01-28 DIAGNOSIS — S338XXA Sprain of other parts of lumbar spine and pelvis, initial encounter: Secondary | ICD-10-CM | POA: Diagnosis not present

## 2018-02-04 ENCOUNTER — Encounter (INDEPENDENT_AMBULATORY_CARE_PROVIDER_SITE_OTHER): Payer: Self-pay | Admitting: Internal Medicine

## 2018-02-04 ENCOUNTER — Ambulatory Visit (INDEPENDENT_AMBULATORY_CARE_PROVIDER_SITE_OTHER): Payer: BLUE CROSS/BLUE SHIELD | Admitting: Internal Medicine

## 2018-02-04 VITALS — BP 140/90 | HR 60 | Temp 98.3°F | Ht 66.0 in | Wt 261.0 lb

## 2018-02-04 DIAGNOSIS — K588 Other irritable bowel syndrome: Secondary | ICD-10-CM | POA: Diagnosis not present

## 2018-02-04 DIAGNOSIS — Z79899 Other long term (current) drug therapy: Secondary | ICD-10-CM | POA: Diagnosis not present

## 2018-02-04 DIAGNOSIS — Z860100 Personal history of colon polyps, unspecified: Secondary | ICD-10-CM

## 2018-02-04 DIAGNOSIS — M0609 Rheumatoid arthritis without rheumatoid factor, multiple sites: Secondary | ICD-10-CM | POA: Diagnosis not present

## 2018-02-04 NOTE — Progress Notes (Signed)
Subjective:    Patient ID: Jody Stokes, female    DOB: Mar 15, 1961, 57 y.o.   MRN: [id number]  HPI Here today for f/u. Last seen in 2017 by [name] .She tells me she is due for a colonoscopy.  Hx of colonic adenomas. States has a cousin with pancreatic cancer. She tells me has rectal pain. She says she has hemorrhoids. She has pressure in her rectum. She says she has soreness in her rectum. If she walk too long, she will have the pain. She has a BM everytime she eats x 6 a day. No change in her BM. She is taking the Dicyclomine 40mg  in the am and 2 tabs at night. Her stools are formed and some are loose. She take Imodium as needed.   Hx of colonic adenomas. Her lat colonoscopy in in  February 2016 by Dr. Gala Romney with removal of 4 polyps.  Three were tubular adenomas without dysplasia.    Iron infusion once a year at [facility].  Review of Systems Past Medical History:  Diagnosis Date  . Anemia   . B12 deficiency 03/14/2016  . Congenital heart disease   . Depression   . Gastric bypass status for obesity 03/14/2016  . Iron deficiency anemia 03/14/2016  . Presence of IVC filter   . Pulmonary embolism ([place])   . Rheumatoid arthritis ([place])   . Sleep apnea   . Tubular adenoma of colon     Past Surgical History:  Procedure Laterality Date  . BIOPSY N/A 11/19/2014   Procedure: BIOPSY;  Surgeon: Daneil Dolin, MD;  Location: [facility];  Service: Endoscopy;  Laterality: N/A;  Ascending Colon  . BIOPSY  09/22/2016   Procedure: BIOPSY;  Surgeon: Rogene Houston, MD;  [facility];  Service: Endoscopy;;  small bowel  . CARPAL TUNNEL RELEASE Bilateral   . CHOLECYSTECTOMY  2009  . COLONOSCOPY  07/2012   transverse colon 5 mm polyp removed  . COLONOSCOPY WITH PROPOFOL N/A 11/19/2014   RMR: mutilple colonic polyps removed as described above. Pancolonic diverticulosis(few).  Status post mucosal biopsy to assess for microscopic coliits. /   . ESOPHAGOGASTRODUODENOSCOPY N/A  09/22/2016   Procedure: ESOPHAGOGASTRODUODENOSCOPY (EGD);  Surgeon: Rogene Houston, MD;  [facility];  Service: Endoscopy;  Laterality: N/A;  1:15  . FOOT SURGERY  2015   left  . GASTRIC BYPASS  2008  . HERNIA REPAIR    . HIATAL HERNIA REPAIR  2007  . POLYPECTOMY N/A 11/19/2014   Procedure: POLYPECTOMY;  Surgeon: Daneil Dolin, MD;  Location: [facility];  Service: Endoscopy;  Laterality: N/A;  ileocecal valve, Hepatic Flexure, Splenic Flexure, Descending Colon   . TUBAL LIGATION      Allergies  Allergen Reactions  . Viberzi [Eluxadoline] Other (See Comments)    Patient states that she has extreme abdominal cramping. Patient states that she has extreme abdominal cramping.    Current Outpatient Medications on File Prior to Visit  Medication Sig Dispense Refill  . Cyanocobalamin (VITAMIN B-12 IJ) Inject as directed every 30 (thirty) days.    . diazepam (VALIUM) 5 MG tablet TAKE 1 TABLET BY MOUTH EVERY 8 HOURS IF NEEDED FOR DIZZINESS  0  . dicyclomine (BENTYL) 10 MG capsule Take 1 capsule (10 mg total) by mouth 3 (three) times daily before meals. Patient will take 2 two 10 mg capsules by mouth three times a day before a meal. 180 capsule 4  . DULoxetine (CYMBALTA) 60 MG  capsule Take 60 mg by mouth at bedtime.   0  . ibuprofen (ADVIL,MOTRIN) 200 MG tablet Take 400 mg by mouth every 6 (six) hours as needed for headache or moderate pain.    Marland Kitchen leflunomide (ARAVA) 20 MG tablet TK 1 T PO ONCE D.  2  . metoprolol tartrate (LOPRESSOR) 25 MG tablet take 1 tablet by mouth once daily 30 tablet 6  . modafinil (PROVIGIL) 200 MG tablet take 1 tablet by mouth once daily    . montelukast (SINGULAIR) 10 MG tablet Take 10 mg by mouth at bedtime.    . traMADol (ULTRAM) 50 MG tablet TK 1 T PO QID PRN  0  . zolpidem (AMBIEN) 5 MG tablet Take 5 mg by mouth at bedtime.   0   No current facility-administered medications on file prior to visit.         Objective:   Physical Exam Blood pressure  140/90, pulse 60, temperature 98.3 F (36.8 C), height 5\' 6"  (1.676 m), weight 261 lb (118.4 kg). Alert and oriented. Skin warm and dry. Oral mucosa is moist.   . Sclera anicteric, conjunctivae is pink. Thyroid not enlarged. No cervical lymphadenopathy. Lungs clear. Heart regular rate and rhythm.  Abdomen is soft. Bowel sounds are positive. No hepatomegaly. No abdominal masses felt. No tenderness.  No edema to lower extremities.    Rectal exam: Brown stool. No masses felt.       Assessment & Plan:  IBS. Dicyclomine 20mg  TID. Colonic polyp. Colonoscopy. The risks of bleeding, perforation and infection were reviewed with patient.

## 2018-02-04 NOTE — Patient Instructions (Signed)
Take the Dicyclomine 20mg  TID.

## 2018-02-05 ENCOUNTER — Other Ambulatory Visit (INDEPENDENT_AMBULATORY_CARE_PROVIDER_SITE_OTHER): Payer: Self-pay | Admitting: Internal Medicine

## 2018-02-05 DIAGNOSIS — Z86010 Personal history of colonic polyps: Secondary | ICD-10-CM

## 2018-02-06 ENCOUNTER — Encounter (INDEPENDENT_AMBULATORY_CARE_PROVIDER_SITE_OTHER): Payer: Self-pay | Admitting: *Deleted

## 2018-02-06 ENCOUNTER — Telehealth (INDEPENDENT_AMBULATORY_CARE_PROVIDER_SITE_OTHER): Payer: Self-pay | Admitting: *Deleted

## 2018-02-06 MED ORDER — PEG 3350-KCL-NA BICARB-NACL 420 G PO SOLR: 4000.0000 mL | Freq: Once | ORAL | 0 refills | Status: AC

## 2018-02-06 NOTE — Telephone Encounter (Signed)
Patient needs trilyte 

## 2018-02-07 ENCOUNTER — Telehealth (INDEPENDENT_AMBULATORY_CARE_PROVIDER_SITE_OTHER): Payer: Self-pay | Admitting: *Deleted

## 2018-02-07 DIAGNOSIS — Z6839 Body mass index (BMI) 39.0-39.9, adult: Secondary | ICD-10-CM | POA: Diagnosis not present

## 2018-02-07 DIAGNOSIS — K594 Anal spasm: Secondary | ICD-10-CM | POA: Diagnosis not present

## 2018-02-07 DIAGNOSIS — K580 Irritable bowel syndrome with diarrhea: Secondary | ICD-10-CM | POA: Diagnosis not present

## 2018-02-07 NOTE — Telephone Encounter (Signed)
Patient needs trilyte 

## 2018-02-08 MED ORDER — PEG 3350-KCL-NA BICARB-NACL 420 G PO SOLR: 4000.0000 mL | Freq: Once | ORAL | 0 refills | Status: AC

## 2018-02-26 ENCOUNTER — Inpatient Hospital Stay (HOSPITAL_COMMUNITY): Payer: BLUE CROSS/BLUE SHIELD | Attending: Oncology

## 2018-02-26 ENCOUNTER — Ambulatory Visit (HOSPITAL_COMMUNITY): Payer: BLUE CROSS/BLUE SHIELD

## 2018-02-26 ENCOUNTER — Other Ambulatory Visit: Payer: Self-pay

## 2018-02-26 VITALS — BP 129/74 | HR 68 | Temp 98.3°F | Resp 20 | Wt 259.9 lb

## 2018-02-26 DIAGNOSIS — E538 Deficiency of other specified B group vitamins: Secondary | ICD-10-CM

## 2018-02-26 MED ORDER — CYANOCOBALAMIN 1000 MCG/ML IJ SOLN
1000.0000 ug | Freq: Once | INTRAMUSCULAR | Status: AC
Start: 2018-02-26 — End: 2018-02-26
  Administered 2018-02-26: 1000 ug via INTRAMUSCULAR

## 2018-02-26 MED ORDER — CYANOCOBALAMIN 1000 MCG/ML IJ SOLN
INTRAMUSCULAR | Status: AC
  Filled 2018-02-26: qty 1

## 2018-02-26 NOTE — Patient Instructions (Signed)
[facility] at [facility] Discharge Instructions    Thank you for choosing [facility] at [facility] to provide your oncology and hematology care.  To afford each patient quality time with our provider, please arrive at least 15 minutes before your scheduled appointment time.   If you have a lab appointment with the Cancer Center please come in thru the  Main Entrance and check in at the main information desk  You need to re-schedule your appointment should you arrive 10 or more minutes late.  We strive to give you quality time with our providers, and arriving late affects you and other patients whose appointments are after yours.  Also, if you no show three or more times for appointments you may be dismissed from the clinic at the providers discretion.     Again, thank you for choosing [facility].  Our hope is that these requests will decrease the amount of time that you wait before being seen by our physicians.       _____________________________________________________________  Should you have questions after your visit to  Cancer Center, please contact our office at [phone] between the hours of 8:30 a.m. and 4:30 p.m.  Voicemails left after 4:30 p.m. will not be returned until the following business day.  For prescription refill requests, have your pharmacy contact our office.       Resources For Cancer Patients and their Caregivers ? American Cancer Society: Can assist with transportation, wigs, general needs, runs Look Good Feel Better.        1-888-227-6333 ? Cancer Care: Provides financial assistance, online support groups, medication/co-pay assistance.  1-800-813-HOPE (4673) ? [facility] Assists [place] cancer patients and their families through emotional , educational and financial support.  336-427-4357 ? [place] DSS Where to apply for food stamps, Medicaid and  utility assistance. 336-342-1394 ? [employer]: Transportation to medical appointments. 336-347-2287 ? Social Security Administration: May apply for disability if have a Stage IV cancer. 336-342-7796 1-800-772-1213 ? [place] Aging, Disability and Transit Services: Assists with nutrition, care and transit needs. 336-349-2343  Cancer Center Support Programs:   > Cancer Support Group  2nd Tuesday of the month 1pm-2pm, Journey Room   > Creative Journey  3rd Tuesday of the month 1130am-1pm, Journey Room    

## 2018-02-26 NOTE — Progress Notes (Signed)
Jody Stokes presents today for injection per MD orders. B12 1,077mcg administered IM  in right Upper Arm. Administration without incident. Patient tolerated well.   Vitals stable and discharged home from clinic ambulatory. Follow up as scheduled.

## 2018-02-28 NOTE — Patient Instructions (Signed)
Jody Stokes  02/28/2018     @PREFPERIOPPHARMACY @   Your procedure is scheduled on 03/08/2018.  Report to [facility] at 9:00 A.M.  Call this number if you have problems the morning of surgery:  [phone]   Remember: Nothing to eat or drink after midnight.  Follow instructions given to you by Dr Olevia Perches office      Take these medicines the morning of surgery with A SIP OF WATER Bentyl, Cymbalta, Arava, Metoprolol, Provigil, Singulair    Do not wear jewelry, make-up or nail polish.  Do not wear lotions, powders, or perfumes, or deodorant.  Do not shave 48 hours prior to surgery.  Men may shave face and neck.  Do not bring valuables to the hospital.  [facility] is not responsible for any belongings or valuables.  Contacts, dentures or bridgework may not be worn into surgery.  Leave your suitcase in the car.  After surgery it may be brought to your room.  For patients admitted to the hospital, discharge time will be determined by your treatment team.  Patients discharged the day of surgery will not be allowed to drive home.    Please read over the following fact sheets that you were given. Anesthesia Post-op Instructions     PATIENT INSTRUCTIONS POST-ANESTHESIA  IMMEDIATELY FOLLOWING SURGERY:  Do not drive or operate machinery for the first twenty four hours after surgery.  Do not make any important decisions for twenty four hours after surgery or while taking narcotic pain medications or sedatives.  If you develop intractable nausea and vomiting or a severe headache please notify your doctor immediately.  FOLLOW-UP:  Please make an appointment with your surgeon as instructed. You do not need to follow up with anesthesia unless specifically instructed to do so.  WOUND CARE INSTRUCTIONS (if applicable):  Keep a dry clean dressing on the anesthesia/puncture wound site if there is drainage.  Once the wound has quit draining you may leave it open to air.  Generally you  should leave the bandage intact for twenty four hours unless there is drainage.  If the epidural site drains for more than 36-48 hours please call the anesthesia department.  QUESTIONS?:  Please feel free to call your physician or the hospital operator if you have any questions, and they will be happy to assist you.      Colonoscopy, Adult A colonoscopy is an exam to look at the entire large intestine. During the exam, a lubricated, bendable tube is inserted into the anus and then passed into the rectum, colon, and other parts of the large intestine. A colonoscopy is often done as a part of normal colorectal screening or in response to certain symptoms, such as anemia, persistent diarrhea, abdominal pain, and blood in the stool. The exam can help screen for and diagnose medical problems, including:  Tumors.  Polyps.  Inflammation.  Areas of bleeding.  Tell a health care provider about:  Any allergies you have.  All medicines you are taking, including vitamins, herbs, eye drops, creams, and over-the-counter medicines.  Any problems you or family members have had with anesthetic medicines.  Any blood disorders you have.  Any surgeries you have had.  Any medical conditions you have.  Any problems you have had passing stool. What are the risks? Generally, this is a safe procedure. However, problems may occur, including:  Bleeding.  A tear in the intestine.  A reaction to medicines given during the exam.  Infection (rare).  What  happens before the procedure? Eating and drinking restrictions Follow instructions from your health care provider about eating and drinking, which may include:  A few days before the procedure - follow a low-fiber diet. Avoid nuts, seeds, dried fruit, raw fruits, and vegetables.  1-3 days before the procedure - follow a clear liquid diet. Drink only clear liquids, such as clear broth or bouillon, black coffee or tea, clear juice, clear soft drinks  or sports drinks, gelatin dessert, and popsicles. Avoid any liquids that contain red or purple dye.  On the day of the procedure - do not eat or drink anything during the 2 hours before the procedure, or within the time period that your health care provider recommends.  Bowel prep If you were prescribed an oral bowel prep to clean out your colon:  Take it as told by your health care provider. Starting the day before your procedure, you will need to drink a large amount of medicated liquid. The liquid will cause you to have multiple loose stools until your stool is almost clear or light green.  If your skin or anus gets irritated from diarrhea, you may use these to relieve the irritation: ? Medicated wipes, such as adult wet wipes with aloe and vitamin E. ? A skin soothing-product like petroleum jelly.  If you vomit while drinking the bowel prep, take a break for up to 60 minutes and then begin the bowel prep again. If vomiting continues and you cannot take the bowel prep without vomiting, call your health care provider.  General instructions  Ask your health care provider about changing or stopping your regular medicines. This is especially important if you are taking diabetes medicines or blood thinners.  Plan to have someone take you home from the hospital or clinic. What happens during the procedure?  An IV tube may be inserted into one of your veins.  You will be given medicine to help you relax (sedative).  To reduce your risk of infection: ? Your health care team will wash or sanitize their hands. ? Your anal area will be washed with soap.  You will be asked to lie on your side with your knees bent.  Your health care provider will lubricate a long, thin, flexible tube. The tube will have a camera and a light on the end.  The tube will be inserted into your anus.  The tube will be gently eased through your rectum and colon.  Air will be delivered into your colon to keep it  open. You may feel some pressure or cramping.  The camera will be used to take images during the procedure.  A small tissue sample may be removed from your body to be examined under a microscope (biopsy). If any potential problems are found, the tissue will be sent to a lab for testing.  If small polyps are found, your health care provider may remove them and have them checked for cancer cells.  The tube that was inserted into your anus will be slowly removed. The procedure may vary among health care providers and hospitals. What happens after the procedure?  Your blood pressure, heart rate, breathing rate, and blood oxygen level will be monitored until the medicines you were given have worn off.  Do not drive for 24 hours after the exam.  You may have a small amount of blood in your stool.  You may pass gas and have mild abdominal cramping or bloating due to the air that was used to  inflate your colon during the exam.  It is up to you to get the results of your procedure. Ask your health care provider, or the department performing the procedure, when your results will be ready. This information is not intended to replace advice given to you by your health care provider. Make sure you discuss any questions you have with your health care provider. Document Released: 09/15/2000 Document Revised: 07/19/2016 Document Reviewed: 11/30/2015 Elsevier Interactive Patient Education  2018 [employer].

## 2018-03-01 ENCOUNTER — Encounter (HOSPITAL_COMMUNITY)
Admission: RE | Admit: 2018-03-01 | Discharge: 2018-03-01 | Disposition: A | Discharge status: Home / Self Care | Payer: BLUE CROSS/BLUE SHIELD | Source: Ambulatory Visit | Attending: Internal Medicine | Admitting: Internal Medicine

## 2018-03-01 ENCOUNTER — Encounter (HOSPITAL_COMMUNITY): Payer: Self-pay

## 2018-03-01 ENCOUNTER — Other Ambulatory Visit: Payer: Self-pay

## 2018-03-01 DIAGNOSIS — Z86010 Personal history of colonic polyps: Secondary | ICD-10-CM | POA: Insufficient documentation

## 2018-03-01 DIAGNOSIS — Z01818 Encounter for other preprocedural examination: Secondary | ICD-10-CM | POA: Diagnosis not present

## 2018-03-01 HISTORY — DX: Fibromyalgia: M79.7

## 2018-03-01 LAB — CBC
HCT: 42.1 % (ref 36.0–46.0)
HEMOGLOBIN: 13.3 g/dL (ref 12.0–15.0)
MCH: 30.1 pg (ref 26.0–34.0)
MCHC: 31.6 g/dL (ref 30.0–36.0)
MCV: 95.2 fL (ref 78.0–100.0)
PLATELETS: 159 10*3/uL (ref 150–400)
RBC: 4.42 MIL/uL (ref 3.87–5.11)
RDW: 13.7 % (ref 11.5–15.5)
WBC: 6.2 10*3/uL (ref 4.0–10.5)

## 2018-03-01 LAB — BASIC METABOLIC PANEL
ANION GAP: 7 (ref 5–15)
BUN: 20 mg/dL (ref 6–20)
CALCIUM: 8.8 mg/dL — AB (ref 8.9–10.3)
CO2: 26 mmol/L (ref 22–32)
Chloride: 105 mmol/L (ref 101–111)
Creatinine, Ser: 0.66 mg/dL (ref 0.44–1.00)
Glucose, Bld: 113 mg/dL — ABNORMAL HIGH (ref 65–99)
Potassium: 3.9 mmol/L (ref 3.5–5.1)
SODIUM: 138 mmol/L (ref 135–145)

## 2018-03-06 ENCOUNTER — Other Ambulatory Visit (INDEPENDENT_AMBULATORY_CARE_PROVIDER_SITE_OTHER): Payer: Self-pay | Admitting: Internal Medicine

## 2018-03-06 ENCOUNTER — Other Ambulatory Visit: Payer: Self-pay | Admitting: *Deleted

## 2018-03-06 MED ORDER — METOPROLOL TARTRATE 25 MG PO TABS: 25.0000 mg | ORAL_TABLET | Freq: Every day | ORAL | 0 refills | Status: DC

## 2018-03-06 NOTE — Telephone Encounter (Signed)
Rx(s) sent to pharmacy electronically.  

## 2018-03-08 ENCOUNTER — Ambulatory Visit (HOSPITAL_COMMUNITY): Payer: BLUE CROSS/BLUE SHIELD | Admitting: Anesthesiology

## 2018-03-08 ENCOUNTER — Encounter (HOSPITAL_COMMUNITY): Payer: Self-pay

## 2018-03-08 ENCOUNTER — Encounter (HOSPITAL_COMMUNITY)
Admission: RE | Disposition: A | Discharge status: Home / Self Care | Payer: Self-pay | Source: Ambulatory Visit | Attending: Internal Medicine

## 2018-03-08 ENCOUNTER — Ambulatory Visit (HOSPITAL_COMMUNITY)
Admission: RE | Admit: 2018-03-08 | Discharge: 2018-03-08 | Disposition: A | Discharge status: Home / Self Care | Payer: BLUE CROSS/BLUE SHIELD | Source: Ambulatory Visit | Attending: Internal Medicine | Admitting: Internal Medicine

## 2018-03-08 DIAGNOSIS — E669 Obesity, unspecified: Secondary | ICD-10-CM | POA: Insufficient documentation

## 2018-03-08 DIAGNOSIS — I10 Essential (primary) hypertension: Secondary | ICD-10-CM | POA: Diagnosis not present

## 2018-03-08 DIAGNOSIS — M069 Rheumatoid arthritis, unspecified: Secondary | ICD-10-CM | POA: Diagnosis not present

## 2018-03-08 DIAGNOSIS — G4730 Sleep apnea, unspecified: Secondary | ICD-10-CM | POA: Diagnosis not present

## 2018-03-08 DIAGNOSIS — Z8349 Family history of other endocrine, nutritional and metabolic diseases: Secondary | ICD-10-CM | POA: Diagnosis not present

## 2018-03-08 DIAGNOSIS — K644 Residual hemorrhoidal skin tags: Secondary | ICD-10-CM | POA: Diagnosis not present

## 2018-03-08 DIAGNOSIS — Z888 Allergy status to other drugs, medicaments and biological substances status: Secondary | ICD-10-CM | POA: Insufficient documentation

## 2018-03-08 DIAGNOSIS — Z9049 Acquired absence of other specified parts of digestive tract: Secondary | ICD-10-CM | POA: Diagnosis not present

## 2018-03-08 DIAGNOSIS — M797 Fibromyalgia: Secondary | ICD-10-CM | POA: Diagnosis not present

## 2018-03-08 DIAGNOSIS — Z9884 Bariatric surgery status: Secondary | ICD-10-CM | POA: Insufficient documentation

## 2018-03-08 DIAGNOSIS — Z832 Family history of diseases of the blood and blood-forming organs and certain disorders involving the immune mechanism: Secondary | ICD-10-CM | POA: Insufficient documentation

## 2018-03-08 DIAGNOSIS — D649 Anemia, unspecified: Secondary | ICD-10-CM | POA: Diagnosis not present

## 2018-03-08 DIAGNOSIS — Z860100 Personal history of colon polyps, unspecified: Secondary | ICD-10-CM | POA: Insufficient documentation

## 2018-03-08 DIAGNOSIS — K635 Polyp of colon: Secondary | ICD-10-CM | POA: Diagnosis not present

## 2018-03-08 DIAGNOSIS — Z1211 Encounter for screening for malignant neoplasm of colon: Secondary | ICD-10-CM | POA: Insufficient documentation

## 2018-03-08 DIAGNOSIS — E538 Deficiency of other specified B group vitamins: Secondary | ICD-10-CM | POA: Insufficient documentation

## 2018-03-08 DIAGNOSIS — Z8249 Family history of ischemic heart disease and other diseases of the circulatory system: Secondary | ICD-10-CM | POA: Diagnosis not present

## 2018-03-08 DIAGNOSIS — Z79899 Other long term (current) drug therapy: Secondary | ICD-10-CM | POA: Insufficient documentation

## 2018-03-08 DIAGNOSIS — Z86711 Personal history of pulmonary embolism: Secondary | ICD-10-CM | POA: Insufficient documentation

## 2018-03-08 DIAGNOSIS — F329 Major depressive disorder, single episode, unspecified: Secondary | ICD-10-CM | POA: Diagnosis not present

## 2018-03-08 DIAGNOSIS — D509 Iron deficiency anemia, unspecified: Secondary | ICD-10-CM | POA: Insufficient documentation

## 2018-03-08 DIAGNOSIS — K529 Noninfective gastroenteritis and colitis, unspecified: Secondary | ICD-10-CM | POA: Diagnosis not present

## 2018-03-08 DIAGNOSIS — Z09 Encounter for follow-up examination after completed treatment for conditions other than malignant neoplasm: Secondary | ICD-10-CM | POA: Diagnosis not present

## 2018-03-08 DIAGNOSIS — Z8719 Personal history of other diseases of the digestive system: Secondary | ICD-10-CM | POA: Diagnosis not present

## 2018-03-08 HISTORY — PX: COLONOSCOPY WITH PROPOFOL: SHX5780

## 2018-03-08 HISTORY — PX: BIOPSY: SHX5522

## 2018-03-08 SURGERY — COLONOSCOPY WITH PROPOFOL
Anesthesia: General

## 2018-03-08 MED ORDER — HYDROCORTISONE ACETATE 25 MG RE SUPP: 25.0000 mg | Freq: Every day | RECTAL | 1 refills | Status: DC

## 2018-03-08 MED ORDER — CHLORHEXIDINE GLUCONATE CLOTH 2 % EX PADS: 6.0000 | MEDICATED_PAD | Freq: Once | CUTANEOUS | Status: DC

## 2018-03-08 MED ORDER — CHOLESTYRAMINE LIGHT 4 G PO POWD: 4.0000 g | Freq: Two times a day (BID) | ORAL | 2 refills | Status: DC

## 2018-03-08 MED ORDER — LACTATED RINGERS IV SOLN
INTRAVENOUS | Status: DC
  Administered 2018-03-08: 11:00:00 via INTRAVENOUS

## 2018-03-08 MED ORDER — PROPOFOL 10 MG/ML IV BOLUS
INTRAVENOUS | Status: DC | PRN
  Administered 2018-03-08 (×3): 20 mg via INTRAVENOUS

## 2018-03-08 MED ORDER — PROPOFOL 500 MG/50ML IV EMUL
INTRAVENOUS | Status: DC | PRN
  Administered 2018-03-08: 110 ug/kg/min via INTRAVENOUS
  Administered 2018-03-08: 70 ug/kg/min via INTRAVENOUS

## 2018-03-08 NOTE — Anesthesia Preprocedure Evaluation (Signed)
Anesthesia Evaluation  Patient identified by MRN, date of birth, ID band Patient awake    Reviewed: Allergy & Precautions, H&P , NPO status , Patient's Chart, lab work & pertinent test results, reviewed documented beta blocker date and time   Airway Mallampati: II  TM Distance: >3 FB Neck ROM: full    Dental no notable dental hx.    Pulmonary neg pulmonary ROS, sleep apnea ,    Pulmonary exam normal breath sounds clear to auscultation       Cardiovascular Exercise Tolerance: Good negative cardio ROS   Rhythm:regular Rate:Normal     Neuro/Psych PSYCHIATRIC DISORDERS Depression  Neuromuscular disease negative neurological ROS  negative psych ROS   GI/Hepatic negative GI ROS, Neg liver ROS,   Endo/Other  negative endocrine ROSMorbid obesity  Renal/GU negative Renal ROS  negative genitourinary   Musculoskeletal   Abdominal   Peds  Hematology negative hematology ROS (+) anemia ,   Anesthesia Other Findings   Reproductive/Obstetrics negative OB ROS                             Anesthesia Physical Anesthesia Plan  ASA: III  Anesthesia Plan: General   Post-op Pain Management:    Induction:   PONV Risk Score and Plan:   Airway Management Planned:   Additional Equipment:   Intra-op Plan:   Post-operative Plan:   Informed Consent: I have reviewed the patients History and Physical, chart, labs and discussed the procedure including the risks, benefits and alternatives for the proposed anesthesia with the patient or authorized representative who has indicated his/her understanding and acceptance.   Dental Advisory Given  Plan Discussed with: CRNA  Anesthesia Plan Comments:         Anesthesia Quick Evaluation

## 2018-03-08 NOTE — Transfer of Care (Signed)
Immediate Anesthesia Transfer of Care Note  Patient: Jody Stokes  Procedure(s) Performed: COLONOSCOPY WITH PROPOFOL (N/A ) BIOPSY  Patient Location: PACU  Anesthesia Type:MAC  Level of Consciousness: awake, alert  and patient cooperative  Airway & Oxygen Therapy: Spontaneous   Post-op Assessment: Report given to RN and Post -op Vital signs reviewed and stable  Post vital signs: Reviewed and stable  Last Vitals:  Vitals Value Taken Time  BP    Temp    Pulse 65 03/08/2018 11:42 AM  Resp 8 03/08/2018 11:42 AM  SpO2 92 % 03/08/2018 11:42 AM  Vitals shown include unvalidated device data.  Last Pain:  Vitals:   03/08/18 1025  TempSrc: Oral  PainSc: 0-No pain         Complications: No apparent anesthesia complications

## 2018-03-08 NOTE — Op Note (Signed)
[facility] Patient Name: Jody Stokes Procedure Date: 03/08/2018 11:06 AM MRN: [id number] Date of Birth: 10/12/1960 Attending MD: Hildred Laser , MD CSN: [id number] Age: 57 Admit Type: Outpatient Procedure:                Colonoscopy Indications:              High risk colon cancer surveillance: Personal                            history of colonic polyps Providers:                Hildred Laser, MD, Lurline Del, RN, Randa Spike,                            Technician Referring MD:             Denny Levy, PA Medicines:                Propofol per Anesthesia Complications:            No immediate complications. Estimated Blood Loss:     Estimated blood loss was minimal. Procedure:                After obtaining informed consent, the colonoscope                            was passed under direct vision. Throughout the                            procedure, the patient's blood pressure, pulse, and                            oxygen saturations were monitored continuously. The                            EC-[id number] ([id number]) scope was introduced through                            the anus and advanced to the the cecum, identified                            by appendiceal orifice and ileocecal valve. The                            colonoscopy was performed without difficulty. The                            patient tolerated the procedure well. The quality                            of the bowel preparation was excellent and                            excellent except the ascending colon was fair. The  ileocecal valve, appendiceal orifice, and rectum                            were photographed. Scope In: 11:14:21 AM Scope Out: 11:35:28 AM Scope Withdrawal Time: 0 hours 12 minutes 48 seconds  Total Procedure Duration: 0 hours 21 minutes 7 seconds  Findings:      The perianal and digital rectal examinations were normal.      The colon (entire examined  portion) appeared normal. Biopsies for       histology were taken with a cold forceps from the sigmoid colon for       evaluation of microscopic colitis.      External hemorrhoids were found during retroflexion. The hemorrhoids       were mdium in size. Impression:               - The entire examined colon is normal. Biopsied.                           - External hemorrhoids. Moderate Sedation:      Per Anesthesia Care Recommendation:           - Patient has a contact number available for                            emergencies. The signs and symptoms of potential                            delayed complications were discussed with the                            patient. Return to normal activities tomorrow.                            Written discharge instructions were provided to the                            patient.                           - Resume previous diet today.                           - Continue present medications.                           - No aspirin, ibuprofen, naproxen, or other                            non-steroidal anti-inflammatory drugs for 1 day.                           - Await pathology results.                           - Choledtyramine 4 g po bid.                           -  Anusol-HC supp pr qhs.                           - Repeat colonoscopy in 5 years for surveillance. Procedure Code(s):        --- Professional ---                           [phone], Colonoscopy, flexible; with biopsy, single                            or multiple Diagnosis Code(s):        --- Professional ---                           Z86.010, Personal history of colonic polyps                           K64.4, Residual hemorrhoidal skin tags CPT copyright 2017 American Medical Association. All rights reserved. The codes documented in this report are preliminary and upon coder review may  be revised to meet current compliance requirements. Hildred Laser, MD Hildred Laser,  MD 03/08/2018 11:45:23 AM This report has been signed electronically. Number of Addenda: 0

## 2018-03-08 NOTE — Anesthesia Postprocedure Evaluation (Signed)
Anesthesia Post Note  Patient: ADRYAN DRUCKENMILLER  Procedure(s) Performed: COLONOSCOPY WITH PROPOFOL (N/A ) BIOPSY  Patient location during evaluation: PACU Anesthesia Type: General Level of consciousness: awake and alert and patient cooperative Pain management: satisfactory to patient Vital Signs Assessment: post-procedure vital signs reviewed and stable Respiratory status: spontaneous breathing Cardiovascular status: stable Postop Assessment: no apparent nausea or vomiting Anesthetic complications: no     Last Vitals:  Vitals:   03/08/18 1145 03/08/18 1200  BP: (!) 134/98 140/81  Pulse: 66 65  Resp: (!) 21 14  Temp:    SpO2: 92% 96%    Last Pain:  Vitals:   03/08/18 1200  TempSrc:   PainSc: 0-No pain                 [name]

## 2018-03-08 NOTE — Anesthesia Procedure Notes (Signed)
Procedure Name: MAC Date/Time: 03/08/2018 11:04 AM Performed by: Vista Deck, CRNA Pre-anesthesia Checklist: Patient identified, Emergency Drugs available, Suction available, Timeout performed and Patient being monitored Patient Re-evaluated:Patient Re-evaluated prior to induction Oxygen Delivery Method: Nasal Cannula

## 2018-03-08 NOTE — Discharge Instructions (Signed)
Discontinue dicyclomine resume. No NSAIDs for 24 hours. Resume other medications as before. Cholestyramine 4 g by mouth twice daily.  Take this medication 2 hours before or after taking other medications. Resume usual diet.  Remember you are supposed to eat 6 small meals daily. No driving for 24 hours. Physician will call with biopsy results.   Colonoscopy, Adult, Care After This sheet gives you information about how to care for yourself after your procedure. Your health care provider may also give you more specific instructions. If you have problems or questions, contact your health care provider. What can I expect after the procedure? After the procedure, it is common to have:  A small amount of blood in your stool for 24 hours after the procedure.  Some gas.  Mild abdominal cramping or bloating.  Follow these instructions at home: General instructions   For the first 24 hours after the procedure: ? Do not drive or use machinery. ? Do not sign important documents. ? Do not drink alcohol. ? Do your regular daily activities at a slower pace than normal. ? Eat soft, easy-to-digest foods. ? Rest often.  Take over-the-counter or prescription medicines only as told by your health care provider.  It is up to you to get the results of your procedure. Ask your health care provider, or the department performing the procedure, when your results will be ready. Relieving cramping and bloating  Try walking around when you have cramps or feel bloated.  Apply heat to your abdomen as told by your health care provider. Use a heat source that your health care provider recommends, such as a moist heat pack or a heating pad. ? Place a towel between your skin and the heat source. ? Leave the heat on for 20-30 minutes. ? Remove the heat if your skin turns bright red. This is especially important if you are unable to feel pain, heat, or cold. You may have a greater risk of getting burned. Eating  and drinking  Drink enough fluid to keep your urine clear or pale yellow.  Resume your normal diet as instructed by your health care provider. Avoid heavy or fried foods that are hard to digest.  Avoid drinking alcohol for as long as instructed by your health care provider. Contact a health care provider if:  You have blood in your stool 2-3 days after the procedure. Get help right away if:  You have more than a small spotting of blood in your stool.  You pass large blood clots in your stool.  Your abdomen is swollen.  You have nausea or vomiting.  You have a fever.  You have increasing abdominal pain that is not relieved with medicine. This information is not intended to replace advice given to you by your health care provider. Make sure you discuss any questions you have with your health care provider. Document Released: 05/02/2004 Document Revised: 06/12/2016 Document Reviewed: 11/30/2015 Elsevier Interactive Patient Education  2018 [employer].   Hemorrhoids Hemorrhoids are swollen veins in and around the rectum or anus. There are two types of hemorrhoids:  Internal hemorrhoids. These occur in the veins that are just inside the rectum. They may poke through to the outside and become irritated and painful.  External hemorrhoids. These occur in the veins that are outside of the anus and can be felt as a painful swelling or hard lump near the anus.  Most hemorrhoids do not cause serious problems, and they can be managed with home treatments such  as diet and lifestyle changes. If home treatments do not help your symptoms, procedures can be done to shrink or remove the hemorrhoids. What are the causes? This condition is caused by increased pressure in the anal area. This pressure may result from various things, including:  Constipation.  Straining to have a bowel movement.  Diarrhea.  Pregnancy.  Obesity.  Sitting for long periods of time.  Heavy lifting or other  activity that causes you to strain.  Anal sex.  What are the signs or symptoms? Symptoms of this condition include:  Pain.  Anal itching or irritation.  Rectal bleeding.  Leakage of stool (feces).  Anal swelling.  One or more lumps around the anus.  How is this diagnosed? This condition can often be diagnosed through a visual exam. Other exams or tests may also be done, such as:  Examination of the rectal area with a gloved hand (digital rectal exam).  Examination of the anal canal using a small tube (anoscope).  A blood test, if you have lost a significant amount of blood.  A test to look inside the colon (sigmoidoscopy or colonoscopy).  How is this treated? This condition can usually be treated at home. However, various procedures may be done if dietary changes, lifestyle changes, and other home treatments do not help your symptoms. These procedures can help make the hemorrhoids smaller or remove them completely. Some of these procedures involve surgery, and others do not. Common procedures include:  Rubber band ligation. Rubber bands are placed at the base of the hemorrhoids to cut off the blood supply to them.  Sclerotherapy. Medicine is injected into the hemorrhoids to shrink them.  Infrared coagulation. A type of light energy is used to get rid of the hemorrhoids.  Hemorrhoidectomy surgery. The hemorrhoids are surgically removed, and the veins that supply them are tied off.  Stapled hemorrhoidopexy surgery. A circular stapling device is used to remove the hemorrhoids and use staples to cut off the blood supply to them.  Follow these instructions at home: Eating and drinking  Eat foods that have a lot of fiber in them, such as whole grains, beans, nuts, fruits, and vegetables. Ask your health care provider about taking products that have added fiber (fiber supplements).  Drink enough fluid to keep your urine clear or pale yellow. Managing pain and  swelling  Take warm sitz baths for 20 minutes, 3-4 times a day to ease pain and discomfort.  If directed, apply ice to the affected area. Using ice packs between sitz baths may be helpful. ? Put ice in a plastic bag. ? Place a towel between your skin and the bag. ? Leave the ice on for 20 minutes, 2-3 times a day. General instructions  Take over-the-counter and prescription medicines only as told by your health care provider.  Use medicated creams or suppositories as told.  Exercise regularly.  Go to the bathroom when you have the urge to have a bowel movement. Do not wait.  Avoid straining to have bowel movements.  Keep the anal area dry and clean. Use wet toilet paper or moist towelettes after a bowel movement.  Do not sit on the toilet for long periods of time. This increases blood pooling and pain. Contact a health care provider if:  You have increasing pain and swelling that are not controlled by treatment or medicine.  You have uncontrolled bleeding.  You have difficulty having a bowel movement, or you are unable to have a bowel movement.  You have pain or inflammation outside the area of the hemorrhoids. This information is not intended to replace advice given to you by your health care provider. Make sure you discuss any questions you have with your health care provider. Document Released: 09/15/2000 Document Revised: 02/16/2016 Document Reviewed: 06/02/2015 Elsevier Interactive Patient Education  [employer].

## 2018-03-08 NOTE — H&P (Signed)
Jody Stokes is an 57 y.o. female.   Chief Complaint: Patient is here for colonoscopy. HPI: Patient is 57 year old Afro-American female with history of colonic adenomas and is here for surveillance colonoscopy.  She has chronic diarrhea.  She states dicyclomine has not helped.  She has 3-4 bowel movements daily.  Most of her bowel movements occur after meals.  She also complains of rectal burning and she feels a lump in anal region when she has a bowel movement and then it disappears.  She denies anorexia or weight loss. Family history is negative for CRC.  Past Medical History:  Diagnosis Date  . Anemia   . B12 deficiency 03/14/2016  . Congenital heart disease   . Depression   . Fibromyalgia   . Gastric bypass status for obesity 03/14/2016  . Iron deficiency anemia 03/14/2016  . Presence of IVC filter   . Pulmonary embolism ([place])   . Rheumatoid arthritis (HCC)    Rheumatoid  . Sleep apnea    uses CPAP  . Tubular adenoma of colon     Past Surgical History:  Procedure Laterality Date  . BIOPSY N/A 11/19/2014   Procedure: BIOPSY;  Surgeon: Daneil Dolin, MD;  Location: [facility];  Service: Endoscopy;  Laterality: N/A;  Ascending Colon  . BIOPSY  09/22/2016   Procedure: BIOPSY;  Surgeon: Rogene Houston, MD;  Location: [facility];  Service: Endoscopy;;  small bowel  . CARPAL TUNNEL RELEASE Bilateral   . CHOLECYSTECTOMY  2009  . COLONOSCOPY  07/2012   transverse colon 5 mm polyp removed  . COLONOSCOPY WITH PROPOFOL N/A 11/19/2014   RMR: mutilple colonic polyps removed as described above. Pancolonic diverticulosis(few).  Status post mucosal biopsy to assess for microscopic coliits. /   . ESOPHAGOGASTRODUODENOSCOPY N/A 09/22/2016   Procedure: ESOPHAGOGASTRODUODENOSCOPY (EGD);  Surgeon: Rogene Houston, MD;  Location: [facility];  Service: Endoscopy;  Laterality: N/A;  1:15  . FOOT SURGERY  2015   left and right  . GASTRIC BYPASS  2008  . HERNIA REPAIR    . HIATAL HERNIA REPAIR   2007  . POLYPECTOMY N/A 11/19/2014   Procedure: POLYPECTOMY;  Surgeon: Daneil Dolin, MD;  Location: [facility];  Service: Endoscopy;  Laterality: N/A;  ileocecal valve, Hepatic Flexure, Splenic Flexure, Descending Colon   . TUBAL LIGATION      Family History  Problem Relation Age of Onset  . HIV Sister   . Hypertension Brother   . Diabetes Brother   . Kidney disease Brother   . Hypertension Brother   . Heart Problems Brother        No details.   . Hypertension Brother   . Colon cancer Neg Hx    Social History:  reports that she has never smoked. She has never used smokeless tobacco. She reports that she does not drink alcohol or use drugs.  Allergies:  Allergies  Allergen Reactions  . Viberzi [Eluxadoline] Other (See Comments)    Patient states that she has extreme abdominal cramping. Patient states that she has extreme abdominal cramping.    Medications Prior to Admission  Medication Sig Dispense Refill  . Cyanocobalamin (VITAMIN B-12 IJ) Inject 1,000 mcg as directed every 30 (thirty) days.     Marland Kitchen dicyclomine (BENTYL) 10 MG capsule Take 1 capsule (10 mg total) by mouth 3 (three) times daily before meals. Patient will take 2 two 10 mg capsules by mouth three times a day before a meal. (Patient taking differently: Take  20 mg by mouth 3 (three) times daily before meals. ) 180 capsule 4  . DULoxetine (CYMBALTA) 60 MG capsule Take 60 mg by mouth at bedtime.   0  . Golimumab ([place] ARIA IV) Inject 1 Dose into the vein every 8 (eight) weeks.    Marland Kitchen ibuprofen (ADVIL,MOTRIN) 200 MG tablet Take 400 mg by mouth every 6 (six) hours as needed for headache or moderate pain.    Marland Kitchen leflunomide (ARAVA) 20 MG tablet Take 20 mg by mouth at bedtime.    . metoprolol tartrate (LOPRESSOR) 25 MG tablet Take 1 tablet (25 mg total) by mouth daily. <PLEASE MAKE APPOINTMENT FOR REFILLS> 30 tablet 0  . modafinil (PROVIGIL) 200 MG tablet Take 200 mg by mouth 2 (two) times daily.    . montelukast (SINGULAIR) 10  MG tablet TAKE 1 TABLET BY MOUTH AT BEDTIME 30 tablet 2  . polyethylene glycol-electrolytes (NULYTELY/GOLYTELY) 420 g solution Take 4,000 mLs by mouth once.  0  . zolpidem (AMBIEN) 5 MG tablet Take 5 mg by mouth at bedtime as needed for sleep.   0  . NONFORMULARY OR COMPOUNDED ITEM Apply 1 application topically 2 (two) times daily as needed (FOR ANAL DISCOMFORT). DILTIAZEM 2%-LIDOCAINE 5% COMPOUNDED BY [facility].      No results found for this or any previous visit (from the past 48 hour(s)). No results found.  ROS  Blood pressure 132/78, pulse 68, temperature 98.4 F (36.9 C), temperature source Oral, resp. rate 16, last menstrual period 03/01/2017, SpO2 96 %. Physical Exam  Constitutional: She appears well-developed and well-nourished.  HENT:  Mouth/Throat: Oropharynx is clear and moist.  Eyes: Conjunctivae are normal. No scleral icterus.  Neck: No thyromegaly present.  Cardiovascular: Normal rate, regular rhythm and normal heart sounds.  No murmur heard. Respiratory: Effort normal and breath sounds normal.  GI:  Abdomen is full with multiple laparoscopy scars across upper abdomen.  Abdomen is soft and nontender without organomegaly or masses.  Musculoskeletal: She exhibits no edema.  Lymphadenopathy:    She has no cervical adenopathy.  Neurological: She is alert.  Skin: Skin is warm and dry.     Assessment/Plan History of colonic adenomas. Chronic diarrhea. Surveillance colonoscopy.  Hildred Laser, MD 03/08/2018, 10:55 AM

## 2018-03-11 ENCOUNTER — Telehealth (INDEPENDENT_AMBULATORY_CARE_PROVIDER_SITE_OTHER): Payer: Self-pay | Admitting: *Deleted

## 2018-03-11 NOTE — Telephone Encounter (Signed)
Patient called the office to say that the medication that Dr.Rehman prescribed was to expensive and ask if there was something else cheaper.  Per Dr.Rehman the Hydrocortisone 2.5% cream inset 1 applicator per rectal q HS for 14 days with 1 refill.  This was called to [facility]. Patient was made aware.

## 2018-03-18 ENCOUNTER — Encounter (HOSPITAL_COMMUNITY): Payer: Self-pay | Admitting: Internal Medicine

## 2018-03-29 ENCOUNTER — Ambulatory Visit (HOSPITAL_COMMUNITY): Payer: BLUE CROSS/BLUE SHIELD

## 2018-04-01 ENCOUNTER — Ambulatory Visit (HOSPITAL_COMMUNITY): Payer: BLUE CROSS/BLUE SHIELD

## 2018-04-01 DIAGNOSIS — M0609 Rheumatoid arthritis without rheumatoid factor, multiple sites: Secondary | ICD-10-CM | POA: Diagnosis not present

## 2018-04-05 ENCOUNTER — Inpatient Hospital Stay (HOSPITAL_COMMUNITY): Payer: BLUE CROSS/BLUE SHIELD | Attending: Oncology

## 2018-04-05 VITALS — BP 147/82 | HR 81 | Temp 98.6°F | Resp 16

## 2018-04-05 DIAGNOSIS — E538 Deficiency of other specified B group vitamins: Secondary | ICD-10-CM | POA: Diagnosis not present

## 2018-04-05 MED ORDER — CYANOCOBALAMIN 1000 MCG/ML IJ SOLN
1000.0000 ug | Freq: Once | INTRAMUSCULAR | Status: AC
  Administered 2018-04-05: 1000 ug via INTRAMUSCULAR

## 2018-04-05 MED ORDER — CYANOCOBALAMIN 1000 MCG/ML IJ SOLN
INTRAMUSCULAR | Status: AC
  Filled 2018-04-05: qty 1

## 2018-04-05 NOTE — Patient Instructions (Signed)
[place] at [facility] Discharge Instructions  B12 injection today Follow up as scheduled.   Thank you for choosing [place] at [facility] to provide your oncology and hematology care.  To afford each patient quality time with our provider, please arrive at least 15 minutes before your scheduled appointment time.   If you have a lab appointment with the [facility] please come in thru the  Main Entrance and check in at the main information desk  You need to re-schedule your appointment should you arrive 10 or more minutes late.  We strive to give you quality time with our providers, and arriving late affects you and other patients whose appointments are after yours.  Also, if you no show three or more times for appointments you may be dismissed from the clinic at the providers discretion.     Again, thank you for choosing [facility].  Our hope is that these requests will decrease the amount of time that you wait before being seen by our physicians.       _____________________________________________________________  Should you have questions after your visit to [facility], please contact our office at [phone] between the hours of 8:30 a.m. and 4:30 p.m.  Voicemails left after 4:30 p.m. will not be returned until the following business day.  For prescription refill requests, have your pharmacy contact our office.       Resources For Cancer Patients and their Caregivers ? American Cancer Society: Can assist with transportation, wigs, general needs, runs Look Good Feel Better.        270-078-8251 ? Cancer Care: Provides financial assistance, online support groups, medication/co-pay assistance.  1-800-813-HOPE 313-364-0619) ? [place] Assists [place] cancer patients and their families through emotional , educational and financial support.  (272)130-2943 ? [place]  DSS Where to apply for food stamps, Medicaid and utility assistance. 514 546 3142 ? [facility]: Transportation to medical appointments. (281) 232-6218 ? Social Security Administration: May apply for disability if have a Stage IV cancer. 612-789-0548 919-344-6300 ? [employer], Disability and Transit Services: Assists with nutrition, care and transit needs. [place] Support Programs:   > Cancer Support Group  2nd Tuesday of the month 1pm-2pm, Journey Room   > Creative Journey  3rd Tuesday of the month 1130am-1pm, Journey Room

## 2018-04-08 DIAGNOSIS — M5126 Other intervertebral disc displacement, lumbar region: Secondary | ICD-10-CM | POA: Diagnosis not present

## 2018-04-08 DIAGNOSIS — M797 Fibromyalgia: Secondary | ICD-10-CM | POA: Diagnosis not present

## 2018-04-08 DIAGNOSIS — M0609 Rheumatoid arthritis without rheumatoid factor, multiple sites: Secondary | ICD-10-CM | POA: Diagnosis not present

## 2018-05-03 ENCOUNTER — Other Ambulatory Visit (HOSPITAL_COMMUNITY): Payer: BLUE CROSS/BLUE SHIELD

## 2018-05-03 ENCOUNTER — Ambulatory Visit (HOSPITAL_COMMUNITY): Payer: BLUE CROSS/BLUE SHIELD

## 2018-05-03 ENCOUNTER — Ambulatory Visit (HOSPITAL_COMMUNITY): Payer: BLUE CROSS/BLUE SHIELD | Admitting: Adult Health

## 2018-05-07 ENCOUNTER — Ambulatory Visit (HOSPITAL_COMMUNITY): Payer: BLUE CROSS/BLUE SHIELD | Admitting: Internal Medicine

## 2018-05-07 ENCOUNTER — Ambulatory Visit (HOSPITAL_COMMUNITY): Payer: BLUE CROSS/BLUE SHIELD

## 2018-05-07 ENCOUNTER — Other Ambulatory Visit (HOSPITAL_COMMUNITY): Payer: BLUE CROSS/BLUE SHIELD

## 2018-05-10 ENCOUNTER — Ambulatory Visit (HOSPITAL_COMMUNITY): Payer: BLUE CROSS/BLUE SHIELD | Admitting: Internal Medicine

## 2018-05-10 ENCOUNTER — Inpatient Hospital Stay (HOSPITAL_COMMUNITY): Payer: BLUE CROSS/BLUE SHIELD | Attending: Oncology

## 2018-05-10 DIAGNOSIS — K580 Irritable bowel syndrome with diarrhea: Secondary | ICD-10-CM | POA: Diagnosis not present

## 2018-05-10 DIAGNOSIS — D509 Iron deficiency anemia, unspecified: Secondary | ICD-10-CM | POA: Insufficient documentation

## 2018-05-10 DIAGNOSIS — E538 Deficiency of other specified B group vitamins: Secondary | ICD-10-CM | POA: Diagnosis not present

## 2018-05-10 DIAGNOSIS — D508 Other iron deficiency anemias: Secondary | ICD-10-CM

## 2018-05-10 LAB — CBC WITH DIFFERENTIAL/PLATELET
Basophils Absolute: 0.1 10*3/uL (ref 0.0–0.1)
Basophils Relative: 1 %
EOS PCT: 2 %
Eosinophils Absolute: 0.2 10*3/uL (ref 0.0–0.7)
HEMATOCRIT: 41.9 % (ref 36.0–46.0)
Hemoglobin: 13.3 g/dL (ref 12.0–15.0)
LYMPHS ABS: 2.3 10*3/uL (ref 0.7–4.0)
LYMPHS PCT: 30 %
MCH: 30.4 pg (ref 26.0–34.0)
MCHC: 31.7 g/dL (ref 30.0–36.0)
MCV: 95.7 fL (ref 78.0–100.0)
Monocytes Absolute: 0.6 10*3/uL (ref 0.1–1.0)
Monocytes Relative: 8 %
NEUTROS ABS: 4.6 10*3/uL (ref 1.7–7.7)
Neutrophils Relative %: 59 %
PLATELETS: 163 10*3/uL (ref 150–400)
RBC: 4.38 MIL/uL (ref 3.87–5.11)
RDW: 13.9 % (ref 11.5–15.5)
WBC: 7.6 10*3/uL (ref 4.0–10.5)

## 2018-05-10 LAB — COMPREHENSIVE METABOLIC PANEL
ALK PHOS: 99 U/L (ref 38–126)
ALT: 39 U/L (ref 0–44)
AST: 33 U/L (ref 15–41)
Albumin: 3.8 g/dL (ref 3.5–5.0)
Anion gap: 6 (ref 5–15)
BILIRUBIN TOTAL: 0.6 mg/dL (ref 0.3–1.2)
BUN: 19 mg/dL (ref 6–20)
CALCIUM: 8.6 mg/dL — AB (ref 8.9–10.3)
CHLORIDE: 108 mmol/L (ref 98–111)
CO2: 25 mmol/L (ref 22–32)
CREATININE: 0.74 mg/dL (ref 0.44–1.00)
Glucose, Bld: 86 mg/dL (ref 70–99)
Potassium: 4.1 mmol/L (ref 3.5–5.1)
Sodium: 139 mmol/L (ref 135–145)
TOTAL PROTEIN: 6.9 g/dL (ref 6.5–8.1)

## 2018-05-10 LAB — FERRITIN: Ferritin: 46 ng/mL (ref 11–307)

## 2018-05-10 LAB — LACTATE DEHYDROGENASE: LDH: 183 U/L (ref 98–192)

## 2018-05-17 ENCOUNTER — Inpatient Hospital Stay (HOSPITAL_COMMUNITY): Payer: BLUE CROSS/BLUE SHIELD

## 2018-05-17 ENCOUNTER — Encounter (HOSPITAL_COMMUNITY): Payer: Self-pay | Admitting: Internal Medicine

## 2018-05-17 ENCOUNTER — Inpatient Hospital Stay (HOSPITAL_BASED_OUTPATIENT_CLINIC_OR_DEPARTMENT_OTHER): Payer: BLUE CROSS/BLUE SHIELD | Admitting: Internal Medicine

## 2018-05-17 VITALS — BP 155/91 | HR 83 | Temp 98.5°F | Resp 16 | Wt 277.7 lb

## 2018-05-17 DIAGNOSIS — D509 Iron deficiency anemia, unspecified: Secondary | ICD-10-CM

## 2018-05-17 DIAGNOSIS — E538 Deficiency of other specified B group vitamins: Secondary | ICD-10-CM

## 2018-05-17 DIAGNOSIS — K580 Irritable bowel syndrome with diarrhea: Secondary | ICD-10-CM | POA: Diagnosis not present

## 2018-05-17 DIAGNOSIS — D508 Other iron deficiency anemias: Secondary | ICD-10-CM

## 2018-05-17 MED ORDER — CYANOCOBALAMIN 1000 MCG/ML IJ SOLN
INTRAMUSCULAR | Status: AC
  Filled 2018-05-17: qty 1

## 2018-05-17 MED ORDER — CYANOCOBALAMIN 1000 MCG/ML IJ SOLN
1000.0000 ug | Freq: Once | INTRAMUSCULAR | Status: AC
  Administered 2018-05-17: 1000 ug via INTRAMUSCULAR

## 2018-05-17 NOTE — Progress Notes (Signed)
Jody Stokes tolerated Vit B12 injection well without complaints or incident. VSS Pt discharged self ambulatory in satisfactory condition

## 2018-05-17 NOTE — Patient Instructions (Signed)
[facility] at [facility] Discharge Instructions  You saw Dr. Higgs today.   Thank you for choosing [facility] at [facility] to provide your oncology and hematology care.  To afford each patient quality time with our provider, please arrive at least 15 minutes before your scheduled appointment time.   If you have a lab appointment with the Cancer Center please come in thru the  Main Entrance and check in at the main information desk  You need to re-schedule your appointment should you arrive 10 or more minutes late.  We strive to give you quality time with our providers, and arriving late affects you and other patients whose appointments are after yours.  Also, if you no show three or more times for appointments you may be dismissed from the clinic at the providers discretion.     Again, thank you for choosing [facility].  Our hope is that these requests will decrease the amount of time that you wait before being seen by our physicians.       _____________________________________________________________  Should you have questions after your visit to  Cancer Center, please contact our office at [phone] between the hours of 8:00 a.m. and 4:30 p.m.  Voicemails left after 4:00 p.m. will not be returned until the following business day.  For prescription refill requests, have your pharmacy contact our office and allow 72 hours.    Cancer Center Support Programs:   > Cancer Support Group  2nd Tuesday of the month 1pm-2pm, Journey Room    

## 2018-05-17 NOTE — Progress Notes (Signed)
Diagnosis Other iron deficiency anemia - Plan: CBC with Differential/Platelet, Comprehensive metabolic panel, Lactate dehydrogenase, Ferritin, CBC with Differential/Platelet, Comprehensive metabolic panel, Lactate dehydrogenase, Ferritin  Staging Cancer Staging No matching staging information was found for the patient.  CURRENT THERAPY: IV iron prn AND monthly vitamin B12 injections   REASON FOR VISIT: Follow-up for Iron deficiency anemia AND vitamin B12 deficiency secondary to malabsorption s/p bariatric surgery in 2008  Assessment and plan: 1.  Iron deficiency anemia.  Patient was last treated with IV iron in January 2019.   Labs done 05/10/2018 reviewed and showed WBC 7.6 HB 13.3 plts 163,000.  Chemistries WNL with Cr 0.74, K+ 4.1.  Ferritin is 46.  She will RTC in 09/2018 for labs and will be notified of results and if IV iron recommended.    She will be seen for follow-up in 01/2019.    2.  B12 deficiency.  Continue monthly B12.  Follow-up with GI as recommended.  Pt has undergone gastric bypass.    3.  Diarrhea.  She reports no significant change in symptoms and reports this is due to IBS.    4.  Health maintenance.  Follow-up with PCP for mammogram scheduling.  She reports she is due for mammogram and will schedule that in the evening.  INTERVAL HISTORY: 57 y.o. female previously followed by Dr. Talbert Stokes for iron deficiency anemia and vitamin B12 deficiency. She was last treated with IV iron 10/2017.    Current status: Patient is seen today for follow-up.  She is here to go over labs.    Problem List Patient Active Problem List   Diagnosis Date Noted  . Hx of colonic polyps [Z86.010] 02/05/2018  . Tarsal tunnel syndrome [G57.50] 01/31/2017  . Abdominal pain, chronic, epigastric [R10.13, G89.29] 08/28/2016  . B12 deficiency [E53.8] 03/14/2016  . Iron deficiency anemia [D50.9] 03/14/2016  . Gastric bypass status for obesity [Z98.84] 03/14/2016  . Plantar fasciitis of right foot  [M72.2] 10/04/2015  . Plantar fasciitis of left foot [M72.2] 09/15/2015  . Plantar fasciitis [M72.2] 08/12/2015  . Heel spur [M77.30] 08/12/2015  . Fatigue [R53.83] 03/23/2015  . History of colonic polyps [Z86.010]   . Diverticulosis of colon without hemorrhage [K57.30]   . History of adenomatous polyp of colon [Z86.010] 11/09/2014  . Diarrhea [R19.7] 11/09/2014  . Abdominal pain [R10.9] 11/09/2014    Past Medical History Past Medical History:  Diagnosis Date  . Anemia   . B12 deficiency 03/14/2016  . Congenital heart disease   . Depression   . Fibromyalgia   . Gastric bypass status for obesity 03/14/2016  . Iron deficiency anemia 03/14/2016  . Presence of IVC filter   . Pulmonary embolism ([place])   . Rheumatoid arthritis (HCC)    Rheumatoid  . Sleep apnea    uses CPAP  . Tubular adenoma of colon     Past Surgical History Past Surgical History:  Procedure Laterality Date  . BIOPSY N/A 11/19/2014   Procedure: BIOPSY;  Surgeon: Daneil Dolin, MD;  Location: [facility];  Service: Endoscopy;  Laterality: N/A;  Ascending Colon  . BIOPSY  09/22/2016   Procedure: BIOPSY;  Surgeon: Rogene Houston, MD;  Location: [facility];  Service: Endoscopy;;  small bowel  . BIOPSY  03/08/2018   Procedure: BIOPSY;  Surgeon: Rogene Houston, MD;  Location: [facility];  Service: Endoscopy;;  colon  . CARPAL TUNNEL RELEASE Bilateral   . CHOLECYSTECTOMY  2009  . COLONOSCOPY  07/2012   transverse colon  5 mm polyp removed  . COLONOSCOPY WITH PROPOFOL N/A 11/19/2014   RMR: mutilple colonic polyps removed as described above. Pancolonic diverticulosis(few).  Status post mucosal biopsy to assess for microscopic coliits. /   . COLONOSCOPY WITH PROPOFOL N/A 03/08/2018   Procedure: COLONOSCOPY WITH PROPOFOL;  Surgeon: Rogene Houston, MD;  [facility]: [facility];  Service: Endoscopy;  Laterality: N/A;  . ESOPHAGOGASTRODUODENOSCOPY N/A 09/22/2016   Procedure: ESOPHAGOGASTRODUODENOSCOPY (EGD);  Surgeon:  Rogene Houston, MD;  Location: [facility];  Service: Endoscopy;  Laterality: N/A;  1:15  . FOOT SURGERY  2015   left and right  . GASTRIC BYPASS  2008  . HERNIA REPAIR    . HIATAL HERNIA REPAIR  2007  . POLYPECTOMY N/A 11/19/2014   Procedure: POLYPECTOMY;  Surgeon: Daneil Dolin, MD;  Location: [facility]S;  Service: Endoscopy;  Laterality: N/A;  ileocecal valve, Hepatic Flexure, Splenic Flexure, Descending Colon   . TUBAL LIGATION      Family History Family History  Problem Relation Age of Onset  . HIV Sister   . Hypertension Brother   . Diabetes Brother   . Kidney disease Brother   . Hypertension Brother   . Heart Problems Brother        No details.   . Hypertension Brother   . Colon cancer Neg Hx      Social History  reports that she has never smoked. She has never used smokeless tobacco. She reports that she does not drink alcohol or use drugs.  Medications  Current Outpatient Medications:  .  cholestyramine light 4 g POWD, Take 1 packet (4 g total) by mouth 2 (two) times daily., Disp: 60 packet, Rfl: 2 .  Cyanocobalamin (VITAMIN B-12 IJ), Inject 1,000 mcg as directed every 30 (thirty) days. , Disp: , Rfl:  .  DULoxetine (CYMBALTA) 60 MG capsule, Take 60 mg by mouth at bedtime. , Disp: , Rfl: 0 .  Golimumab ([place] ARIA IV), Inject 1 Dose into the vein every 8 (eight) weeks., Disp: , Rfl:  .  ibuprofen (ADVIL,MOTRIN) 200 MG tablet, Take 400 mg by mouth every 6 (six) hours as needed for headache or moderate pain., Disp: , Rfl:  .  leflunomide (ARAVA) 20 MG tablet, Take 20 mg by mouth at bedtime., Disp: , Rfl:  .  metoprolol tartrate (LOPRESSOR) 25 MG tablet, Take 1 tablet (25 mg total) by mouth daily. <PLEASE MAKE APPOINTMENT FOR REFILLS>, Disp: 30 tablet, Rfl: 0 .  modafinil (PROVIGIL) 200 MG tablet, Take 200 mg by mouth 2 (two) times daily., Disp: , Rfl:  .  montelukast (SINGULAIR) 10 MG tablet, TAKE 1 TABLET BY MOUTH AT BEDTIME, Disp: 30 tablet, Rfl: 2 .  NONFORMULARY OR  COMPOUNDED ITEM, Apply 1 application topically 2 (two) times daily as needed (FOR ANAL DISCOMFORT). DILTIAZEM 2%-LIDOCAINE 5% COMPOUNDED BY [facility]., Disp: , Rfl:  .  zolpidem (AMBIEN) 5 MG tablet, Take 5 mg by mouth at bedtime as needed for sleep. , Disp: , Rfl: 0  Allergies Viberzi [eluxadoline]  Review of Systems Review of Systems - Oncology ROS negative other than diarrhea.     Physical Exam  Vitals Wt Readings from Last 3 Encounters:  05/17/18 277 lb 11.2 oz (126 kg)  03/01/18 261 lb (118.4 kg)  02/26/18 259 lb 14.4 oz (117.9 kg)   Temp Readings from Last 3 Encounters:  05/17/18 98.5 F (36.9 C) (Oral)  04/05/18 98.6 F (37 C) (Oral)  03/08/18 98.1 F (36.7 C) (  Oral)   BP Readings from Last 3 Encounters:  05/17/18 (!) 155/91  04/05/18 (!) 147/82  03/08/18 (!) 143/92   Pulse Readings from Last 3 Encounters:  05/17/18 83  04/05/18 81  03/08/18 62    Constitutional: Well-developed, well-nourished, and in no distress.   HENT: Head: Normocephalic and atraumatic.  Mouth/Throat: No oropharyngeal exudate. Mucosa moist. Eyes: Pupils are equal, round, and reactive to light. Conjunctivae are normal. No scleral icterus.  Neck: Normal range of motion. Neck supple. No JVD present.  Cardiovascular: Normal rate, regular rhythm and normal heart sounds.  Exam reveals no gallop and no friction rub.   No murmur heard. Pulmonary/Chest: Effort normal and breath sounds normal. No respiratory distress. No wheezes.No rales.  Abdominal: Soft. Bowel sounds are normal. No distension. Tender in epigastric area no guarding.   Musculoskeletal: No edema or tenderness.  Lymphadenopathy: No cervical, axillaryor supraclavicular adenopathy.  Neurological: Alert and oriented to person, place, and time. No cranial nerve deficit.  Skin: Skin is warm and dry. No rash noted. No erythema. No pallor.  Psychiatric: Affect and judgment normal.   Labs No visits with results within 3  Day(s) from this visit.  Latest known visit with results is:  Appointment on 05/10/2018  Component Date Value Ref Range Status  . WBC 05/10/2018 7.6  4.0 - 10.5 K/uL Final  . RBC 05/10/2018 4.38  3.87 - 5.11 MIL/uL Final  . Hemoglobin 05/10/2018 13.3  12.0 - 15.0 g/dL Final  . HCT 05/10/2018 41.9  36.0 - 46.0 % Final  . MCV 05/10/2018 95.7  78.0 - 100.0 fL Final  . MCH 05/10/2018 30.4  26.0 - 34.0 pg Final  . MCHC 05/10/2018 31.7  30.0 - 36.0 g/dL Final  . RDW 05/10/2018 13.9  11.5 - 15.5 % Final  . Platelets 05/10/2018 163  150 - 400 K/uL Final  . Neutrophils Relative % 05/10/2018 59  % Final  . Neutro Abs 05/10/2018 4.6  1.7 - 7.7 K/uL Final  . Lymphocytes Relative 05/10/2018 30  % Final  . Lymphs Abs 05/10/2018 2.3  0.7 - 4.0 K/uL Final  . Monocytes Relative 05/10/2018 8  % Final  . Monocytes Absolute 05/10/2018 0.6  0.1 - 1.0 K/uL Final  . Eosinophils Relative 05/10/2018 2  % Final  . Eosinophils Absolute 05/10/2018 0.2  0.0 - 0.7 K/uL Final  . Basophils Relative 05/10/2018 1  % Final  . Basophils Absolute 05/10/2018 0.1  0.0 - 0.1 K/uL Final   Performed at [facility] [address] [place], [place] [address]  . Sodium 05/10/2018 139  135 - 145 mmol/L Final  . Potassium 05/10/2018 4.1  3.5 - 5.1 mmol/L Final  . Chloride 05/10/2018 108  98 - 111 mmol/L Final  . CO2 05/10/2018 25  22 - 32 mmol/L Final  . Glucose, Bld 05/10/2018 86  70 - 99 mg/dL Final  . BUN 05/10/2018 19  6 - 20 mg/dL Final  . Creatinine, Ser 05/10/2018 0.74  0.44 - 1.00 mg/dL Final  . Calcium 05/10/2018 8.6* 8.9 - 10.3 mg/dL Final  . Total Protein 05/10/2018 6.9  6.5 - 8.1 g/dL Final  . Albumin 05/10/2018 3.8  3.5 - 5.0 g/dL Final  . AST 05/10/2018 33  15 - 41 U/L Final  . ALT 05/10/2018 39  0 - 44 U/L Final  . Alkaline Phosphatase 05/10/2018 99  38 - 126 U/L Final  . Total Bilirubin 05/10/2018 0.6  0.3 - 1.2 mg/dL Final  . GFR calc  non Af Amer 05/10/2018 >60  >60 mL/min Final  . GFR calc Af Amer  05/10/2018 >60  >60 mL/min Final   Comment: (NOTE) The eGFR has been calculated using the CKD EPI equation. This calculation has not been validated in all clinical situations. eGFR's persistently <60 mL/min signify possible Chronic Kidney Disease.   Georgiann Hahn gap 05/10/2018 6  5 - 15 Final   Performed at [facility], [address] [place], [place] [address]  . LDH 05/10/2018 183  98 - 192 U/L Final   Performed at [facility], [address], [place], [place] [address]  . Ferritin 05/10/2018 46  11 - 307 ng/mL Final   Performed at [facility] [address] [place], [place] [address]     Pathology Orders Placed This Encounter  Procedures  . CBC with Differential/Platelet    Standing Status:   Future    Standing Expiration Date:   05/18/2019  . Comprehensive metabolic panel    Standing Status:   Future    Standing Expiration Date:   05/18/2019  . Lactate dehydrogenase    Standing Status:   Future    Standing Expiration Date:   05/18/2019  . Ferritin    Standing Status:   Future    Standing Expiration Date:   05/18/2019  . CBC with Differential/Platelet    Standing Status:   Future    Standing Expiration Date:   05/17/2020  . Comprehensive metabolic panel    Standing Status:   Future    Standing Expiration Date:   05/17/2020  . Lactate dehydrogenase    Standing Status:   Future    Standing Expiration Date:   05/17/2020  . Ferritin    Standing Status:   Future    Standing Expiration Date:   05/17/2020       Zoila Shutter MD

## 2018-05-17 NOTE — Patient Instructions (Signed)
[facility] at [facility] Discharge Instructions  Received Vit B12 injection today. Follow-up as scheduled. Call clinic for any questions or concerns   Thank you for choosing [facility] at [facility] to provide your oncology and hematology care.  To afford each patient quality time with our provider, please arrive at least 15 minutes before your scheduled appointment time.   If you have a lab appointment with the Cancer Center please come in thru the  Main Entrance and check in at the main information desk  You need to re-schedule your appointment should you arrive 10 or more minutes late.  We strive to give you quality time with our providers, and arriving late affects you and other patients whose appointments are after yours.  Also, if you no show three or more times for appointments you may be dismissed from the clinic at the providers discretion.     Again, thank you for choosing [facility].  Our hope is that these requests will decrease the amount of time that you wait before being seen by our physicians.       _____________________________________________________________  Should you have questions after your visit to [facility], please contact our office at [phone] between the hours of 8:00 a.m. and 4:30 p.m.  Voicemails left after 4:00 p.m. will not be returned until the following business day.  For prescription refill requests, have your pharmacy contact our office and allow 72 hours.    Cancer Center Support Programs:   > Cancer Support Group  2nd Tuesday of the month 1pm-2pm, Journey Room   

## 2018-05-31 DIAGNOSIS — Z79899 Other long term (current) drug therapy: Secondary | ICD-10-CM | POA: Diagnosis not present

## 2018-05-31 DIAGNOSIS — M0609 Rheumatoid arthritis without rheumatoid factor, multiple sites: Secondary | ICD-10-CM | POA: Diagnosis not present

## 2018-06-09 ENCOUNTER — Other Ambulatory Visit (INDEPENDENT_AMBULATORY_CARE_PROVIDER_SITE_OTHER): Payer: Self-pay | Admitting: Internal Medicine

## 2018-06-13 ENCOUNTER — Other Ambulatory Visit (HOSPITAL_COMMUNITY): Payer: Self-pay | Admitting: Internal Medicine

## 2018-06-14 ENCOUNTER — Inpatient Hospital Stay (HOSPITAL_COMMUNITY): Payer: BLUE CROSS/BLUE SHIELD

## 2018-06-17 ENCOUNTER — Ambulatory Visit (HOSPITAL_COMMUNITY): Payer: BLUE CROSS/BLUE SHIELD

## 2018-06-21 ENCOUNTER — Ambulatory Visit (HOSPITAL_COMMUNITY): Payer: BLUE CROSS/BLUE SHIELD

## 2018-07-17 ENCOUNTER — Ambulatory Visit (HOSPITAL_COMMUNITY): Payer: BLUE CROSS/BLUE SHIELD

## 2018-07-19 ENCOUNTER — Ambulatory Visit (HOSPITAL_COMMUNITY): Payer: BLUE CROSS/BLUE SHIELD

## 2018-07-19 DIAGNOSIS — M5126 Other intervertebral disc displacement, lumbar region: Secondary | ICD-10-CM | POA: Diagnosis not present

## 2018-07-19 DIAGNOSIS — M797 Fibromyalgia: Secondary | ICD-10-CM | POA: Diagnosis not present

## 2018-07-19 DIAGNOSIS — M0609 Rheumatoid arthritis without rheumatoid factor, multiple sites: Secondary | ICD-10-CM | POA: Diagnosis not present

## 2018-08-09 ENCOUNTER — Other Ambulatory Visit (INDEPENDENT_AMBULATORY_CARE_PROVIDER_SITE_OTHER): Payer: Self-pay | Admitting: Internal Medicine

## 2018-08-16 ENCOUNTER — Ambulatory Visit (HOSPITAL_COMMUNITY): Payer: BLUE CROSS/BLUE SHIELD

## 2018-08-23 DIAGNOSIS — M0609 Rheumatoid arthritis without rheumatoid factor, multiple sites: Secondary | ICD-10-CM | POA: Diagnosis not present

## 2018-08-23 DIAGNOSIS — R350 Frequency of micturition: Secondary | ICD-10-CM | POA: Diagnosis not present

## 2018-08-23 DIAGNOSIS — M461 Sacroiliitis, not elsewhere classified: Secondary | ICD-10-CM | POA: Diagnosis not present

## 2018-08-23 DIAGNOSIS — M545 Low back pain: Secondary | ICD-10-CM | POA: Diagnosis not present

## 2018-08-23 DIAGNOSIS — R81 Glycosuria: Secondary | ICD-10-CM | POA: Diagnosis not present

## 2018-08-23 DIAGNOSIS — R8290 Unspecified abnormal findings in urine: Secondary | ICD-10-CM | POA: Diagnosis not present

## 2018-09-13 ENCOUNTER — Ambulatory Visit (HOSPITAL_COMMUNITY): Payer: BLUE CROSS/BLUE SHIELD

## 2018-09-13 ENCOUNTER — Other Ambulatory Visit (HOSPITAL_COMMUNITY): Payer: BLUE CROSS/BLUE SHIELD

## 2018-09-16 ENCOUNTER — Ambulatory Visit (HOSPITAL_COMMUNITY): Payer: BLUE CROSS/BLUE SHIELD

## 2018-09-16 ENCOUNTER — Other Ambulatory Visit (HOSPITAL_COMMUNITY): Payer: BLUE CROSS/BLUE SHIELD

## 2018-09-18 ENCOUNTER — Other Ambulatory Visit (INDEPENDENT_AMBULATORY_CARE_PROVIDER_SITE_OTHER): Payer: Self-pay | Admitting: Internal Medicine

## 2018-09-19 DIAGNOSIS — Z9884 Bariatric surgery status: Secondary | ICD-10-CM | POA: Diagnosis not present

## 2018-09-19 DIAGNOSIS — R1013 Epigastric pain: Secondary | ICD-10-CM | POA: Diagnosis not present

## 2018-10-11 ENCOUNTER — Ambulatory Visit (HOSPITAL_COMMUNITY): Payer: BLUE CROSS/BLUE SHIELD

## 2018-10-11 DIAGNOSIS — Z9884 Bariatric surgery status: Secondary | ICD-10-CM | POA: Diagnosis not present

## 2018-10-11 DIAGNOSIS — M069 Rheumatoid arthritis, unspecified: Secondary | ICD-10-CM | POA: Diagnosis not present

## 2018-10-11 DIAGNOSIS — K589 Irritable bowel syndrome without diarrhea: Secondary | ICD-10-CM | POA: Diagnosis not present

## 2018-10-11 DIAGNOSIS — Z79899 Other long term (current) drug therapy: Secondary | ICD-10-CM | POA: Diagnosis not present

## 2018-10-11 DIAGNOSIS — Z6841 Body Mass Index (BMI) 40.0 and over, adult: Secondary | ICD-10-CM | POA: Diagnosis not present

## 2018-10-11 DIAGNOSIS — Z903 Acquired absence of stomach [part of]: Secondary | ICD-10-CM | POA: Diagnosis not present

## 2018-10-11 DIAGNOSIS — M1990 Unspecified osteoarthritis, unspecified site: Secondary | ICD-10-CM | POA: Diagnosis not present

## 2018-10-11 DIAGNOSIS — R1013 Epigastric pain: Secondary | ICD-10-CM | POA: Diagnosis not present

## 2018-10-25 DIAGNOSIS — M0609 Rheumatoid arthritis without rheumatoid factor, multiple sites: Secondary | ICD-10-CM | POA: Diagnosis not present

## 2018-11-01 DIAGNOSIS — M0609 Rheumatoid arthritis without rheumatoid factor, multiple sites: Secondary | ICD-10-CM | POA: Diagnosis not present

## 2018-11-01 DIAGNOSIS — M5126 Other intervertebral disc displacement, lumbar region: Secondary | ICD-10-CM | POA: Diagnosis not present

## 2018-11-01 DIAGNOSIS — M797 Fibromyalgia: Secondary | ICD-10-CM | POA: Diagnosis not present

## 2018-11-08 ENCOUNTER — Ambulatory Visit (HOSPITAL_COMMUNITY): Payer: BLUE CROSS/BLUE SHIELD

## 2018-11-18 ENCOUNTER — Ambulatory Visit (HOSPITAL_COMMUNITY): Payer: BLUE CROSS/BLUE SHIELD

## 2018-12-17 ENCOUNTER — Ambulatory Visit (HOSPITAL_COMMUNITY): Payer: BLUE CROSS/BLUE SHIELD

## 2018-12-20 ENCOUNTER — Ambulatory Visit (HOSPITAL_COMMUNITY): Payer: BLUE CROSS/BLUE SHIELD

## 2018-12-27 DIAGNOSIS — M0609 Rheumatoid arthritis without rheumatoid factor, multiple sites: Secondary | ICD-10-CM | POA: Diagnosis not present

## 2018-12-31 ENCOUNTER — Other Ambulatory Visit (INDEPENDENT_AMBULATORY_CARE_PROVIDER_SITE_OTHER): Payer: Self-pay | Admitting: Internal Medicine

## 2019-01-03 DIAGNOSIS — G4733 Obstructive sleep apnea (adult) (pediatric): Secondary | ICD-10-CM | POA: Diagnosis not present

## 2019-01-09 ENCOUNTER — Other Ambulatory Visit (HOSPITAL_COMMUNITY): Payer: BLUE CROSS/BLUE SHIELD

## 2019-01-10 ENCOUNTER — Other Ambulatory Visit (HOSPITAL_COMMUNITY): Payer: BLUE CROSS/BLUE SHIELD

## 2019-01-15 ENCOUNTER — Other Ambulatory Visit (HOSPITAL_COMMUNITY): Payer: BLUE CROSS/BLUE SHIELD

## 2019-01-17 ENCOUNTER — Ambulatory Visit (HOSPITAL_COMMUNITY): Payer: BLUE CROSS/BLUE SHIELD | Admitting: Nurse Practitioner

## 2019-01-17 ENCOUNTER — Ambulatory Visit (HOSPITAL_COMMUNITY): Payer: BLUE CROSS/BLUE SHIELD

## 2019-01-18 ENCOUNTER — Other Ambulatory Visit (INDEPENDENT_AMBULATORY_CARE_PROVIDER_SITE_OTHER): Payer: Self-pay | Admitting: Internal Medicine

## 2019-01-20 ENCOUNTER — Other Ambulatory Visit (INDEPENDENT_AMBULATORY_CARE_PROVIDER_SITE_OTHER): Payer: Self-pay | Admitting: *Deleted

## 2019-01-20 MED ORDER — MONTELUKAST SODIUM 10 MG PO TABS: 10.0000 mg | ORAL_TABLET | Freq: Every day | ORAL | 2 refills | Status: DC

## 2019-02-19 ENCOUNTER — Other Ambulatory Visit (INDEPENDENT_AMBULATORY_CARE_PROVIDER_SITE_OTHER): Payer: Self-pay | Admitting: Internal Medicine

## 2019-02-19 NOTE — Telephone Encounter (Signed)
Patient needs to be seen in [place] , last seen a year ago. Patient may see Terri.

## 2019-02-21 DIAGNOSIS — M0609 Rheumatoid arthritis without rheumatoid factor, multiple sites: Secondary | ICD-10-CM | POA: Diagnosis not present

## 2019-02-28 DIAGNOSIS — Z79899 Other long term (current) drug therapy: Secondary | ICD-10-CM | POA: Diagnosis not present

## 2019-02-28 DIAGNOSIS — M797 Fibromyalgia: Secondary | ICD-10-CM | POA: Diagnosis not present

## 2019-02-28 DIAGNOSIS — M5126 Other intervertebral disc displacement, lumbar region: Secondary | ICD-10-CM | POA: Diagnosis not present

## 2019-02-28 DIAGNOSIS — M0609 Rheumatoid arthritis without rheumatoid factor, multiple sites: Secondary | ICD-10-CM | POA: Diagnosis not present

## 2019-03-19 ENCOUNTER — Ambulatory Visit (INDEPENDENT_AMBULATORY_CARE_PROVIDER_SITE_OTHER): Payer: BLUE CROSS/BLUE SHIELD | Admitting: Internal Medicine

## 2019-04-14 ENCOUNTER — Other Ambulatory Visit (INDEPENDENT_AMBULATORY_CARE_PROVIDER_SITE_OTHER): Payer: Self-pay | Admitting: Internal Medicine

## 2019-04-15 NOTE — Telephone Encounter (Signed)
Jody Stokes , please call patient and make a 6 month appointment.

## 2019-04-18 DIAGNOSIS — M25562 Pain in left knee: Secondary | ICD-10-CM | POA: Diagnosis not present

## 2019-04-18 DIAGNOSIS — R269 Unspecified abnormalities of gait and mobility: Secondary | ICD-10-CM | POA: Diagnosis not present

## 2019-04-18 DIAGNOSIS — M069 Rheumatoid arthritis, unspecified: Secondary | ICD-10-CM | POA: Diagnosis not present

## 2019-04-18 DIAGNOSIS — Z6841 Body Mass Index (BMI) 40.0 and over, adult: Secondary | ICD-10-CM | POA: Diagnosis not present

## 2019-04-25 DIAGNOSIS — M0609 Rheumatoid arthritis without rheumatoid factor, multiple sites: Secondary | ICD-10-CM | POA: Diagnosis not present

## 2019-04-25 DIAGNOSIS — Z79899 Other long term (current) drug therapy: Secondary | ICD-10-CM | POA: Diagnosis not present

## 2019-06-18 DIAGNOSIS — R110 Nausea: Secondary | ICD-10-CM | POA: Diagnosis not present

## 2019-06-18 DIAGNOSIS — R1013 Epigastric pain: Secondary | ICD-10-CM | POA: Diagnosis not present

## 2019-06-18 DIAGNOSIS — Z6841 Body Mass Index (BMI) 40.0 and over, adult: Secondary | ICD-10-CM | POA: Diagnosis not present

## 2019-06-20 DIAGNOSIS — M0609 Rheumatoid arthritis without rheumatoid factor, multiple sites: Secondary | ICD-10-CM | POA: Diagnosis not present

## 2019-06-20 DIAGNOSIS — Z79899 Other long term (current) drug therapy: Secondary | ICD-10-CM | POA: Diagnosis not present

## 2019-07-06 NOTE — Progress Notes (Signed)
Subjective:    Patient ID: Jody Stokes, female    DOB: 07-May-1961, 58 y.o.   MRN: [id number]  HPI [name] is a 58 year old female with a past medical history of depression, PE with IVC filter 2017 after hiatal hernia surgery 2017, IDA, Vitamin B12 deficiency, rheumatoid arthritis, sleep apnea on cpap and colon polyps. Status post Roux en y  bypass 2018 and Cholecystectomy in 2010.  She reports having a history of iron deficiency for 17 years.  She has been vitamin B12 deficient since her gastric bypass surgery in 2018.  She last received an iron infusion and B12 injection more than 6 months ago.  She is on Simponi injections for rheumatoid arthritis.  She presents today for further evaluation regarding epigastric pain. She complains of having epigastric pain for the past year. No specific food triggers. She feels sore inside which is constant. She also has pressure type epigastric pain that hurst for a few hours. Last week, she developed nausea, felt like food was sitting to the epigastric area.  No vomiting.  She took Prilosec 20 mg once daily for 2 weeks without improvement.  She is taking Ondansetron ODT 8 mg 1 tab as needed.  Her most recent EGD was 09/27/2016 which showed a very small gastric pouch with a patent gastrojejunal anastomosis. She some times she has lower abdominal cramps.  She underwent a colonoscopy in June 2019 due to having diarrhea, the colonoscopy was normal without evidence of colitis.  She was prescribed cholestyramine and Imodium and her diarrhea significantly improved.  She continues on cholestyramine once daily.  She takes an Imodium if she leaves the house.  She is passing 1-2 normal formed brown stools daily.  No rectal bleeding or melena.  No NSAIDS. No alcohol. March 2020 she weighed 271 lbs. Weight today 288 lbs. No fever, sweats or chills.  No family history of upper GI or colorectal cancer.  Colonoscopy 03/18/2018:  The entire examined colon is normal. Biopsies  negative for colitis. External hemorrhoids. 5 year recall.  Colonoscopy 11/19/2014:  Normal rectum. Somewhat elongated, redundant colon. Few scattered pancolonic diverticula. Multiple tubular adenomatous colonic polyps-(1) 5 mm polyp at the ileocecal valve, (1) 5 mm polyp at hepatic flexure, (1) 5 mm polyp at the splenic flexure and (1) 5 mm polyp in the mid descending segment. Otherwise, the remainder of the colonic mucosa appeared normal. Ascending colon biopsies negative for microscopic colitis.  The distal 5 cm of terminal ileum mucosa appeared normal.  EGD 09/27/2016: -Normal esophagus. - Z-line irregular, 40 cm from the incisors with focal erythema- Roux-en-Y gastrojejunostomy. - Very small gastric pouch with normal mucosa and patent gastrojejunal anastomosis without ulceration. - The examined jejunum was normal. efferent loop normal to 90 cm from the incisors. Focal edema noted to blind afferent loop.  - No specimens collected  Past Medical History:  Diagnosis Date  . Anemia   . B12 deficiency 03/14/2016  . Congenital heart disease   . Depression   . Fibromyalgia   . Gastric bypass status for obesity 03/14/2016  . Iron deficiency anemia 03/14/2016  . Presence of IVC filter   . Pulmonary embolism ([place])   . Rheumatoid arthritis (HCC)    Rheumatoid  . Sleep apnea    uses CPAP  . Tubular adenoma of colon    Past Surgical History:  Procedure Laterality Date  . BIOPSY N/A 11/19/2014   Procedure: BIOPSY;  Surgeon: Daneil Dolin, MD;  Location: [facility];  Service:  Endoscopy;  Laterality: N/A;  Ascending Colon  . BIOPSY  09/22/2016   Procedure: BIOPSY;  Surgeon: Rogene Houston, MD;  [facility];  Service: Endoscopy;;  small bowel  . BIOPSY  03/08/2018   Procedure: BIOPSY;  Surgeon: Rogene Houston, MD;  [facility];  Service: Endoscopy;;  colon  . CARPAL TUNNEL RELEASE Bilateral   . CHOLECYSTECTOMY  2009  . COLONOSCOPY  07/2012   transverse colon 5 mm  polyp removed  . COLONOSCOPY WITH PROPOFOL N/A 11/19/2014   RMR: mutilple colonic polyps removed as described above. Pancolonic diverticulosis(few).  Status post mucosal biopsy to assess for microscopic coliits. /   . COLONOSCOPY WITH PROPOFOL N/A 03/08/2018   Procedure: COLONOSCOPY WITH PROPOFOL;  Surgeon: Rogene Houston, MD;  [facility];  Service: Endoscopy;  Laterality: N/A;  . ESOPHAGOGASTRODUODENOSCOPY N/A 09/22/2016   Procedure: ESOPHAGOGASTRODUODENOSCOPY (EGD);  Surgeon: Rogene Houston, MD;  [facility];  Service: Endoscopy;  Laterality: N/A;  1:15  . FOOT SURGERY  2015   left and right  . GASTRIC BYPASS  2008  . HERNIA REPAIR    . HIATAL HERNIA REPAIR  2007  . POLYPECTOMY N/A 11/19/2014   Procedure: POLYPECTOMY;  Surgeon: Daneil Dolin, MD;  Location: [facility];  Service: Endoscopy;  Laterality: N/A;  ileocecal valve, Hepatic Flexure, Splenic Flexure, Descending Colon   . TUBAL LIGATION     Current Outpatient Medications on File Prior to Visit  Medication Sig Dispense Refill  . cholestyramine light (PREVALITE) 4 g packet MIX AND DRINK 1 PACKET BY MOUTH TWICE DAILY 60 packet 5  . Cyanocobalamin (VITAMIN B-12 IJ) Inject 1,000 mcg as directed every 30 (thirty) days.     . diphenhydramine-acetaminophen (TYLENOL PM) 25-500 MG TABS tablet Take 1 tablet by mouth at bedtime.    . DULoxetine (CYMBALTA) 60 MG capsule Take 60 mg by mouth at bedtime.   0  . Golimumab (SIMPONI ARIA IV) Inject 1 Dose into the vein every 8 (eight) weeks.    Marland Kitchen ibuprofen (ADVIL,MOTRIN) 200 MG tablet Take 400 mg by mouth every 6 (six) hours as needed for headache or moderate pain.    . modafinil (PROVIGIL) 200 MG tablet Take 200 mg by mouth 2 (two) times daily.    . montelukast (SINGULAIR) 10 MG tablet TAKE 1 TABLET(10 MG) BY MOUTH AT BEDTIME 30 tablet 6  . omeprazole (PRILOSEC) 20 MG capsule TK ONE C PO BID TAKE 30 MINUTES BEFORE FIRST MEAL.    Marland Kitchen ondansetron (ZOFRAN-ODT) 8 MG disintegrating  tablet DIS 1 T ON THE TONGUE Q 6 TO 8 H    . OTREXUP 15 MG/0.4ML SOAJ INJECT UTD [place] ONCE WEEKLY FOR 30 DAYS    . NONFORMULARY OR COMPOUNDED ITEM Apply 1 application topically 2 (two) times daily as needed (FOR ANAL DISCOMFORT). DILTIAZEM 2%-LIDOCAINE 5% COMPOUNDED BY [facility].     No current facility-administered medications on file prior to visit.    Allergies  Allergen Reactions  . Viberzi [Eluxadoline] Other (See Comments)    Patient states that she has extreme abdominal cramping. Patient states that she has extreme abdominal cramping.    Review of Systems see HPI, all other systems reviewed and are negative    Objective:   Physical Exam Blood pressure (!) 156/86, pulse 89, temperature 98.4 F (36.9 C), temperature source Oral, height 5\' 6"  (1.676 m), weight 288 lb (130.6 kg), last menstrual period 03/01/2017.  General: 58 year old obese female in  no acute distress Eyes: Sclera nonicteric, conjunctiva pink Mouth: Dentition intact, no ulcers or lesions Neck: Supple, no lymphadenopathy Heart: Magda Paganini regular rhythm with infrequent premature beat, no murmurs Lungs: Breath sounds clear throughout Abdomen: Right upper quadrant and epigastric tenderness without rebound or guarding, positive bowel sounds to all 4 quadrants, laparoscopic scars intact, no HSM Lower extremities: No edema Neuro: Alert and oriented x4, no focal deficits     Assessment & Plan:   81.  58 year old female with epigastric pain and nausea with past Roux-en-Y gastric bypass surgery in 2018 -CBC, CMP, CRP and celiac panel -EGD benefits and risk discussed including risk with sedation, bleeding and perforation -Commended trial of famotidine 20 mg once daily -Further follow-up to be determined after EGD completed -Patient to call the office if her symptoms worsen, may require an abdominal/pelvic CT -Ondansetron as needed  2.  History of iron deficiency anemia - see plan in #1  3.  History of  vitamin B12 deficiency in secondary to gastric bypass surgery -Check vitamin B12 level  4.  History of colon polyps -Next colonoscopy due June 2024

## 2019-07-08 ENCOUNTER — Other Ambulatory Visit: Payer: Self-pay

## 2019-07-08 ENCOUNTER — Ambulatory Visit (INDEPENDENT_AMBULATORY_CARE_PROVIDER_SITE_OTHER): Payer: BC Managed Care – PPO | Admitting: Nurse Practitioner

## 2019-07-08 ENCOUNTER — Encounter (INDEPENDENT_AMBULATORY_CARE_PROVIDER_SITE_OTHER): Payer: Self-pay | Admitting: Nurse Practitioner

## 2019-07-08 ENCOUNTER — Telehealth (INDEPENDENT_AMBULATORY_CARE_PROVIDER_SITE_OTHER): Payer: Self-pay | Admitting: Nurse Practitioner

## 2019-07-08 VITALS — BP 156/86 | HR 89 | Temp 98.4°F | Ht 66.0 in | Wt 288.0 lb

## 2019-07-08 DIAGNOSIS — D508 Other iron deficiency anemias: Secondary | ICD-10-CM | POA: Diagnosis not present

## 2019-07-08 DIAGNOSIS — R1013 Epigastric pain: Secondary | ICD-10-CM

## 2019-07-08 DIAGNOSIS — E538 Deficiency of other specified B group vitamins: Secondary | ICD-10-CM

## 2019-07-08 NOTE — Patient Instructions (Signed)
1 schedule an upper endoscopy  2 complete the provided lab order today  3 Famotidine 20 mg 1 tab by mouth once daily to be purchased over-the-counter  4 call our office if your symptoms worsen  5 other follow-up to be determined after EGD completed

## 2019-07-08 NOTE — Telephone Encounter (Signed)
Jody Stokes, pls call the lab to add vitamin b12 level and iron, iron saturation and ferritin to labs done today, I meant to add to order but did not do so earlier. I entered these orders into epic. thx.

## 2019-07-10 NOTE — Telephone Encounter (Signed)
Pt's CBC was normal, MCV normal therefore I will not reorder B12 or iron panels, no longer necessary.

## 2019-07-11 LAB — COMPLETE METABOLIC PANEL WITH GFR
AG Ratio: 1.5 (calc) (ref 1.0–2.5)
ALT: 27 U/L (ref 6–29)
AST: 23 U/L (ref 10–35)
Albumin: 4.1 g/dL (ref 3.6–5.1)
Alkaline phosphatase (APISO): 111 U/L (ref 37–153)
BUN: 12 mg/dL (ref 7–25)
CO2: 27 mmol/L (ref 20–32)
Calcium: 9 mg/dL (ref 8.6–10.4)
Chloride: 104 mmol/L (ref 98–110)
Creat: 0.74 mg/dL (ref 0.50–1.05)
GFR, Est African American: 104 mL/min/{1.73_m2} (ref >=60)
GFR, Est Non African American: 90 mL/min/{1.73_m2} (ref >=60)
Globulin: 2.7 g/dL (calc) (ref 1.9–3.7)
Glucose, Bld: 84 mg/dL (ref 65–99)
Potassium: 4.4 mmol/L (ref 3.5–5.3)
Sodium: 142 mmol/L (ref 135–146)
Total Bilirubin: 0.7 mg/dL (ref 0.2–1.2)
Total Protein: 6.8 g/dL (ref 6.1–8.1)

## 2019-07-11 LAB — CELIAC DISEASE PANEL
(tTG) Ab, IgA: 1 U/mL
(tTG) Ab, IgG: 2 U/mL
Gliadin IgA: 4 Units
Gliadin IgG: 2 Units
Immunoglobulin A: 204 mg/dL (ref 47–310)

## 2019-07-11 LAB — CBC WITH DIFFERENTIAL/PLATELET
Absolute Monocytes: 528 cells/uL (ref 200–950)
Basophils Absolute: 33 cells/uL (ref 0–200)
Basophils Relative: 0.5 %
Eosinophils Absolute: 119 cells/uL (ref 15–500)
Eosinophils Relative: 1.8 %
HCT: 41.1 % (ref 35.0–45.0)
Hemoglobin: 13.4 g/dL (ref 11.7–15.5)
Lymphs Abs: 2501 cells/uL (ref 850–3900)
MCH: 30.2 pg (ref 27.0–33.0)
MCHC: 32.6 g/dL (ref 32.0–36.0)
MCV: 92.8 fL (ref 80.0–100.0)
MPV: 12.2 fL (ref 7.5–12.5)
Monocytes Relative: 8 %
Neutro Abs: 3419 cells/uL (ref 1500–7800)
Neutrophils Relative %: 51.8 %
Platelets: 205 10*3/uL (ref 140–400)
RBC: 4.43 10*6/uL (ref 3.80–5.10)
RDW: 13.8 % (ref 11.0–15.0)
Total Lymphocyte: 37.9 %
WBC: 6.6 10*3/uL (ref 3.8–10.8)

## 2019-07-11 LAB — LIPASE: Lipase: 16 U/L (ref 7–60)

## 2019-07-11 LAB — C-REACTIVE PROTEIN: CRP: 1.4 mg/L (ref <8.0)

## 2019-07-16 ENCOUNTER — Encounter (INDEPENDENT_AMBULATORY_CARE_PROVIDER_SITE_OTHER): Payer: Self-pay | Admitting: *Deleted

## 2019-07-16 ENCOUNTER — Other Ambulatory Visit (INDEPENDENT_AMBULATORY_CARE_PROVIDER_SITE_OTHER): Payer: Self-pay | Admitting: *Deleted

## 2019-07-16 DIAGNOSIS — D649 Anemia, unspecified: Secondary | ICD-10-CM | POA: Insufficient documentation

## 2019-07-16 NOTE — Telephone Encounter (Signed)
Noted that the additional labs requested will no longer be needed due to the CBC results.

## 2019-07-31 DIAGNOSIS — R5383 Other fatigue: Secondary | ICD-10-CM | POA: Diagnosis not present

## 2019-07-31 DIAGNOSIS — N950 Postmenopausal bleeding: Secondary | ICD-10-CM | POA: Diagnosis not present

## 2019-07-31 DIAGNOSIS — Z6841 Body Mass Index (BMI) 40.0 and over, adult: Secondary | ICD-10-CM | POA: Diagnosis not present

## 2019-08-08 DIAGNOSIS — N950 Postmenopausal bleeding: Secondary | ICD-10-CM | POA: Diagnosis not present

## 2019-08-18 DIAGNOSIS — N950 Postmenopausal bleeding: Secondary | ICD-10-CM | POA: Diagnosis not present

## 2019-08-18 DIAGNOSIS — Z6841 Body Mass Index (BMI) 40.0 and over, adult: Secondary | ICD-10-CM | POA: Diagnosis not present

## 2019-08-25 DIAGNOSIS — Z6841 Body Mass Index (BMI) 40.0 and over, adult: Secondary | ICD-10-CM | POA: Diagnosis not present

## 2019-08-25 DIAGNOSIS — N950 Postmenopausal bleeding: Secondary | ICD-10-CM | POA: Diagnosis not present

## 2019-09-02 ENCOUNTER — Encounter (HOSPITAL_COMMUNITY): Payer: Self-pay

## 2019-09-02 ENCOUNTER — Other Ambulatory Visit: Payer: Self-pay

## 2019-09-03 ENCOUNTER — Encounter (HOSPITAL_COMMUNITY)
Admission: RE | Admit: 2019-09-03 | Discharge: 2019-09-03 | Disposition: A | Discharge status: Home / Self Care | Payer: BC Managed Care – PPO | Source: Ambulatory Visit | Attending: Internal Medicine | Admitting: Internal Medicine

## 2019-09-03 ENCOUNTER — Other Ambulatory Visit (HOSPITAL_COMMUNITY)
Admission: RE | Admit: 2019-09-03 | Discharge: 2019-09-03 | Disposition: A | Discharge status: Home / Self Care | Payer: BC Managed Care – PPO | Source: Ambulatory Visit | Attending: Internal Medicine | Admitting: Internal Medicine

## 2019-09-03 DIAGNOSIS — Z01812 Encounter for preprocedural laboratory examination: Secondary | ICD-10-CM | POA: Diagnosis not present

## 2019-09-03 DIAGNOSIS — Z20828 Contact with and (suspected) exposure to other viral communicable diseases: Secondary | ICD-10-CM | POA: Diagnosis not present

## 2019-09-03 LAB — SARS CORONAVIRUS 2 (TAT 6-24 HRS): SARS Coronavirus 2: NEGATIVE

## 2019-09-04 ENCOUNTER — Other Ambulatory Visit (INDEPENDENT_AMBULATORY_CARE_PROVIDER_SITE_OTHER): Payer: Self-pay | Admitting: Internal Medicine

## 2019-09-05 ENCOUNTER — Encounter (HOSPITAL_COMMUNITY): Payer: Self-pay | Admitting: Anesthesiology

## 2019-09-05 ENCOUNTER — Encounter (HOSPITAL_COMMUNITY)
Admission: RE | Disposition: A | Discharge status: Home / Self Care | Payer: Self-pay | Source: Home / Self Care | Attending: Internal Medicine

## 2019-09-05 ENCOUNTER — Ambulatory Visit (HOSPITAL_COMMUNITY): Payer: BC Managed Care – PPO | Admitting: Anesthesiology

## 2019-09-05 ENCOUNTER — Ambulatory Visit (HOSPITAL_COMMUNITY)
Admission: RE | Admit: 2019-09-05 | Discharge: 2019-09-05 | Disposition: A | Discharge status: Home / Self Care | Payer: BC Managed Care – PPO | Attending: Internal Medicine | Admitting: Internal Medicine

## 2019-09-05 ENCOUNTER — Other Ambulatory Visit: Payer: Self-pay

## 2019-09-05 DIAGNOSIS — G4730 Sleep apnea, unspecified: Secondary | ICD-10-CM | POA: Insufficient documentation

## 2019-09-05 DIAGNOSIS — Z86010 Personal history of colonic polyps: Secondary | ICD-10-CM | POA: Diagnosis not present

## 2019-09-05 DIAGNOSIS — Z79899 Other long term (current) drug therapy: Secondary | ICD-10-CM | POA: Diagnosis not present

## 2019-09-05 DIAGNOSIS — E538 Deficiency of other specified B group vitamins: Secondary | ICD-10-CM

## 2019-09-05 DIAGNOSIS — F329 Major depressive disorder, single episode, unspecified: Secondary | ICD-10-CM | POA: Diagnosis not present

## 2019-09-05 DIAGNOSIS — R1013 Epigastric pain: Secondary | ICD-10-CM | POA: Diagnosis not present

## 2019-09-05 DIAGNOSIS — M069 Rheumatoid arthritis, unspecified: Secondary | ICD-10-CM | POA: Insufficient documentation

## 2019-09-05 DIAGNOSIS — M797 Fibromyalgia: Secondary | ICD-10-CM | POA: Insufficient documentation

## 2019-09-05 DIAGNOSIS — D508 Other iron deficiency anemias: Secondary | ICD-10-CM

## 2019-09-05 DIAGNOSIS — Z86711 Personal history of pulmonary embolism: Secondary | ICD-10-CM | POA: Insufficient documentation

## 2019-09-05 DIAGNOSIS — M1990 Unspecified osteoarthritis, unspecified site: Secondary | ICD-10-CM | POA: Insufficient documentation

## 2019-09-05 DIAGNOSIS — Z9884 Bariatric surgery status: Secondary | ICD-10-CM | POA: Insufficient documentation

## 2019-09-05 DIAGNOSIS — R112 Nausea with vomiting, unspecified: Secondary | ICD-10-CM | POA: Diagnosis not present

## 2019-09-05 DIAGNOSIS — K228 Other specified diseases of esophagus: Secondary | ICD-10-CM | POA: Diagnosis not present

## 2019-09-05 DIAGNOSIS — D649 Anemia, unspecified: Secondary | ICD-10-CM | POA: Insufficient documentation

## 2019-09-05 DIAGNOSIS — D509 Iron deficiency anemia, unspecified: Secondary | ICD-10-CM | POA: Diagnosis not present

## 2019-09-05 HISTORY — PX: ESOPHAGOGASTRODUODENOSCOPY (EGD) WITH PROPOFOL: SHX5813

## 2019-09-05 SURGERY — ESOPHAGOGASTRODUODENOSCOPY (EGD) WITH PROPOFOL
Anesthesia: General

## 2019-09-05 MED ORDER — KETAMINE HCL 50 MG/5ML IJ SOSY
PREFILLED_SYRINGE | INTRAMUSCULAR | Status: AC
  Filled 2019-09-05: qty 5

## 2019-09-05 MED ORDER — MIDAZOLAM HCL 2 MG/2ML IJ SOLN: 0.5000 mg | Freq: Once | INTRAMUSCULAR | Status: DC | PRN

## 2019-09-05 MED ORDER — CHLORHEXIDINE GLUCONATE CLOTH 2 % EX PADS: 6.0000 | MEDICATED_PAD | Freq: Once | CUTANEOUS | Status: DC

## 2019-09-05 MED ORDER — PROPOFOL 10 MG/ML IV BOLUS
INTRAVENOUS | Status: DC | PRN
  Administered 2019-09-05: 10 mg via INTRAVENOUS

## 2019-09-05 MED ORDER — PROMETHAZINE HCL 25 MG/ML IJ SOLN: 6.2500 mg | INTRAMUSCULAR | Status: DC | PRN

## 2019-09-05 MED ORDER — HYDROCODONE-ACETAMINOPHEN 7.5-325 MG PO TABS: 1.0000 | ORAL_TABLET | Freq: Once | ORAL | Status: DC | PRN

## 2019-09-05 MED ORDER — LACTATED RINGERS IV SOLN
INTRAVENOUS | Status: DC
  Administered 2019-09-05 (×2): via INTRAVENOUS

## 2019-09-05 MED ORDER — PROPOFOL 500 MG/50ML IV EMUL
INTRAVENOUS | Status: DC | PRN
  Administered 2019-09-05: 125 ug/kg/min via INTRAVENOUS

## 2019-09-05 MED ORDER — KETAMINE HCL 10 MG/ML IJ SOLN
INTRAMUSCULAR | Status: DC | PRN
  Administered 2019-09-05: 20 mg via INTRAVENOUS

## 2019-09-05 MED ORDER — HYDROMORPHONE HCL 1 MG/ML IJ SOLN: 0.2500 mg | INTRAMUSCULAR | Status: DC | PRN

## 2019-09-05 MED ORDER — LIDOCAINE 2% (20 MG/ML) 5 ML SYRINGE
INTRAMUSCULAR | Status: DC | PRN
  Administered 2019-09-05: 60 mg via INTRAVENOUS

## 2019-09-05 NOTE — Op Note (Signed)
[facility] Patient Name: Jody Stokes Procedure Date: 09/05/2019 10:47 AM MRN: [id number] Date of Birth: 1961/06/27 Attending MD: Hildred Laser , MD CSN: [id number] Age: 58 Admit Type: Outpatient Procedure:                Upper GI endoscopy Indications:              Epigastric abdominal pain, Nausea with vomiting Providers:                Hildred Laser, MD, Hinton Rao, RN, Raphael Gibney, Technician Referring MD:             Denny Levy, PA Medicines:                Propofol per Anesthesia Complications:            No immediate complications. Estimated Blood Loss:     Estimated blood loss: none. Procedure:                Pre-Anesthesia Assessment:                           - Prior to the procedure, a History and Physical                            was performed, and patient medications and                            allergies were reviewed. The patient's tolerance of                            previous anesthesia was also reviewed. The risks                            and benefits of the procedure and the sedation                            options and risks were discussed with the patient.                            All questions were answered, and informed consent                            was obtained. Prior Anticoagulants: The patient has                            taken no previous anticoagulant or antiplatelet                            agents. ASA Grade Assessment: III - A patient with                            severe systemic disease. After reviewing the risks  and benefits, the patient was deemed in                            satisfactory condition to undergo the procedure.                           After obtaining informed consent, the endoscope was                            passed under direct vision. Throughout the                            procedure, the patient's blood pressure, pulse, and                 oxygen saturations were monitored continuously. The                            [id number]) scope was introduced through the                            mouth, and advanced to the proximal jejunum. The                            upper GI endoscopy was accomplished without                            difficulty. The patient tolerated the procedure                            well. The patient tolerated the procedure well. Scope In: 11:17:35 AM Scope Out: 11:21:22 AM Total Procedure Duration: 0 hours 3 minutes 47 seconds  Findings:      The examined esophagus was normal.      The Z-line was irregular and was found 42 cm from the incisors.      Evidence of a gastric bypass was found. A gastric pouch with a small       size was found. The gastrojejunal anastomosis was characterized by       healthy appearing mucosa. This was traversed.      The examined jejunum was normal for upto 30 cm. Impression:               - Normal esophagus.                           - Z-line irregular, 42 cm from the incisors.                           - Gastric bypass with a small-sized pouch.                            Gastrojejunal anastomosis characterized by healthy                            appearing mucosa.                           -  Normal examined jejunum.                           - No specimens collected.                           Comment; symptoms may be due to Methotrexate.                           Will rule out other etiologies before considering                            stopping methotrexate. Moderate Sedation:      Per Anesthesia Care Recommendation:           - Patient has a contact number available for                            emergencies. The signs and symptoms of potential                            delayed complications were discussed with the                            patient. Return to normal activities tomorrow.                            Written discharge  instructions were provided to the                            patient.                           - Six small meals daily. today.                           - Continue present medications.                           - Perform CT scan (computed tomography) of the                            abdomen and pelvis with contrast. Procedure Code(s):        --- Professional ---                           [id number], Esophagogastroduodenoscopy, flexible,                            transoral; diagnostic, including collection of                            specimen(s) by brushing or washing, when performed                            (separate procedure) Diagnosis Code(s):        --- Professional ---  K22.8, Other specified diseases of esophagus                           Z98.84, Bariatric surgery status                           R10.13, Epigastric pain                           R11.2, Nausea with vomiting, unspecified CPT copyright 2019 American Medical Association. All rights reserved. The codes documented in this report are preliminary and upon coder review may  be revised to meet current compliance requirements. Hildred Laser, MD Hildred Laser, MD 09/05/2019 11:32:51 AM This report has been signed electronically. Number of Addenda: 0

## 2019-09-05 NOTE — H&P (Signed)
Jody Stokes is an 58 y.o. female.   Chief Complaint: Patient is here for esophagogastroduodenoscopy. HPI: Patient is 58 year old African-American female status post gastric bypass surgery in 2008 who presents with several month history of postprandial nausea vomiting and epigastric pain.  Initially she thought it was occurring with certain foods.  She has been watching her diet but her symptoms have gotten worse.  She vomits at least 3 times a week.  Vomiting occurs within 15 to 30 minutes of her meal.  She does not eat multiple small meals a day.  She denies heartburn hematemesis melena or rectal bleeding.  She also complains of epigastric pain.  She does not take OTC NSAIDs.  She does not smoke cigarettes or drink alcohol.  She has not responded to acid suppression.  Past Medical History:  Diagnosis Date  . Anemia   . B12 deficiency 03/14/2016  . Congenital heart disease   . Depression   . Fibromyalgia   . Gastric bypass status for obesity 03/14/2016  . Iron deficiency anemia 03/14/2016  . Presence of IVC filter   . Pulmonary embolism ([place])   . Rheumatoid arthritis (HCC)    Rheumatoid  . Sleep apnea    uses CPAP  . Tubular adenoma of colon     Past Surgical History:  Procedure Laterality Date  . BIOPSY N/A 11/19/2014   Procedure: BIOPSY;  Surgeon: Daneil Dolin, MD;  Location: [facility];  Service: Endoscopy;  Laterality: N/A;  Ascending Colon  . BIOPSY  09/22/2016   Procedure: BIOPSY;  Surgeon: Rogene Houston, MD;  [facility];  Service: Endoscopy;;  small bowel  . BIOPSY  03/08/2018   Procedure: BIOPSY;  Surgeon: Rogene Houston, MD;  [facility];  Service: Endoscopy;;  colon  . CARPAL TUNNEL RELEASE Bilateral   . CHOLECYSTECTOMY  2009  . COLONOSCOPY  07/2012   transverse colon 5 mm polyp removed  . COLONOSCOPY WITH PROPOFOL N/A 11/19/2014   RMR: mutilple colonic polyps removed as described above. Pancolonic diverticulosis(few).  Status post mucosal biopsy  to assess for microscopic coliits. /   . COLONOSCOPY WITH PROPOFOL N/A 03/08/2018   Procedure: COLONOSCOPY WITH PROPOFOL;  Surgeon: Rogene Houston, MD;  [facility];  Service: Endoscopy;  Laterality: N/A;  . ESOPHAGOGASTRODUODENOSCOPY N/A 09/22/2016   Procedure: ESOPHAGOGASTRODUODENOSCOPY (EGD);  Surgeon: Rogene Houston, MD;  [facility];  Service: Endoscopy;  Laterality: N/A;  1:15  . FOOT SURGERY  2015   left and right  . GASTRIC BYPASS  2008  . HERNIA REPAIR    . HIATAL HERNIA REPAIR  2007  . POLYPECTOMY N/A 11/19/2014   Procedure: POLYPECTOMY;  Surgeon: Daneil Dolin, MD;  Location: [facility];  Service: Endoscopy;  Laterality: N/A;  ileocecal valve, Hepatic Flexure, Splenic Flexure, Descending Colon   . TUBAL LIGATION      Family History  Problem Relation Age of Onset  . HIV Sister   . Hypertension Brother   . Diabetes Brother   . Kidney disease Brother   . Hypertension Brother   . Heart Problems Brother        No details.   . Hypertension Brother   . Colon cancer Neg Hx    Social History:  reports that she has never smoked. She has never used smokeless tobacco. She reports that she does not drink alcohol or use drugs.  Allergies:  Allergies  Allergen Reactions  . Viberzi [Eluxadoline] Other (See Comments)  Patient states that she has extreme abdominal cramping.    Medications Prior to Admission  Medication Sig Dispense Refill  . diphenhydramine-acetaminophen (TYLENOL PM) 25-500 MG TABS tablet Take 2 tablets by mouth at bedtime.     . DULoxetine (CYMBALTA) 60 MG capsule Take 60 mg by mouth at bedtime.   0  . golimumab (SIMPONI ARIA) 50 MG/4ML SOLN injection Inject 50 mg into the vein every 8 (eight) weeks.     . modafinil (PROVIGIL) 200 MG tablet Take 200 mg by mouth 2 (two) times daily.    . montelukast (SINGULAIR) 10 MG tablet TAKE 1 TABLET(10 MG) BY MOUTH AT BEDTIME (Patient taking differently: Take 10 mg by mouth at bedtime. ) 30 tablet 6  .  omeprazole (PRILOSEC) 20 MG capsule Take 20 mg by mouth 2 (two) times daily before a meal.     . OTREXUP 15 MG/0.4ML SOAJ Inject 15 mg into the skin every 7 (seven) days.       No results found for this or any previous visit (from the past 48 hour(s)). No results found.  ROS  Blood pressure (!) 159/83, pulse 81, temperature 98.1 F (36.7 C), temperature source Oral, resp. rate 16, last menstrual period 03/01/2017, SpO2 95 %. Physical Exam  Constitutional: She appears well-developed and well-nourished.  HENT:  Mouth/Throat: Oropharynx is clear and moist.  Eyes: Conjunctivae are normal. No scleral icterus.  Neck: No thyromegaly present.  Cardiovascular: Normal rate, regular rhythm and normal heart sounds.  No murmur heard. Respiratory: Effort normal and breath sounds normal.  GI:  Abdomen is full.  She has laparoscopy scars.  On palpation is soft.  She has mild midepigastric tenderness.  No organomegaly or masses.  Musculoskeletal:        General: No edema.  Neurological: She is alert.  Skin: Skin is warm and dry.      Assessment/Plan Chronic postprandial nausea vomiting and epigastric pain in a patient who is status post bariatric surgery over 12 years ago. Diagnostic esophagogastroduodenoscopy.  Hildred Laser, MD 09/05/2019, 11:06 AM

## 2019-09-05 NOTE — Anesthesia Preprocedure Evaluation (Signed)
Anesthesia Evaluation  Patient identified by MRN, date of birth, ID band Patient awake    Reviewed: Allergy & Precautions, NPO status , Patient's Chart, lab work & pertinent test results  Airway Mallampati: I  TM Distance: >3 FB Neck ROM: Full    Dental no notable dental hx. (+) Teeth Intact   Pulmonary sleep apnea and Continuous Positive Airway Pressure Ventilation ,    Pulmonary exam normal breath sounds clear to auscultation       Cardiovascular Exercise Tolerance: Good negative cardio ROS Normal cardiovascular examI Rhythm:Regular Rate:Normal     Neuro/Psych Depression  Neuromuscular disease negative psych ROS   GI/Hepatic negative GI ROS, Neg liver ROS,   Endo/Other  Morbid obesityMO s/p G. Bypass in 2008  Reports [place] repair 2007 - had PE and IVC filter placed at that time   Renal/GU negative Renal ROS  negative genitourinary   Musculoskeletal  (+) Arthritis , Osteoarthritis,  Fibromyalgia -  Abdominal   Peds negative pediatric ROS (+)  Hematology negative hematology ROS (+)   Anesthesia Other Findings   Reproductive/Obstetrics negative OB ROS                             Anesthesia Physical Anesthesia Plan  ASA: III  Anesthesia Plan: General   Post-op Pain Management:    Induction: Intravenous  PONV Risk Score and Plan: 3 and TIVA, Propofol infusion, Ondansetron and Treatment may vary due to age or medical condition  Airway Management Planned: Simple Face Mask and Nasal Cannula  Additional Equipment:   Intra-op Plan:   Post-operative Plan:   Informed Consent: I have reviewed the patients History and Physical, chart, labs and discussed the procedure including the risks, benefits and alternatives for the proposed anesthesia with the patient or authorized representative who has indicated his/her understanding and acceptance.     Dental advisory given  Plan Discussed  with: CRNA  Anesthesia Plan Comments: (Plan Full PPE use  Plan GA with GETA as needed d/w pt -WTP with same after Q&A)        Anesthesia Quick Evaluation

## 2019-09-05 NOTE — Transfer of Care (Signed)
Immediate Anesthesia Transfer of Care Note  Patient: ZYA FINKLE  Procedure(s) Performed: ESOPHAGOGASTRODUODENOSCOPY (EGD) WITH PROPOFOL (N/A )  Patient Location: PACU  Anesthesia Type:MAC  Level of Consciousness: awake  Airway & Oxygen Therapy: Patient Spontanous Breathing and Patient connected to nasal cannula oxygen  Post-op Assessment: Report given to RN and Post -op Vital signs reviewed and stable  Post vital signs: Reviewed and stable  Last Vitals:  Vitals Value Taken Time  BP    Temp    Pulse 77 09/05/19 1130  Resp 20 09/05/19 1130  SpO2 99 % 09/05/19 1130  Vitals shown include unvalidated device data.  Last Pain:  Vitals:   09/05/19 1001  TempSrc: Oral  PainSc: 0-No pain      Patients Stated Pain Goal: 6 (03/47/42 5956)  Complications: No apparent anesthesia complications

## 2019-09-05 NOTE — Discharge Instructions (Signed)
Resume usual medications as before. Consider 6 small meals daily. Keep symptom diary as to frequency of nausea vomiting and epigastric pain in reference to meals. No driving for 24 hours. Will schedule abdominal pelvic CT.  Office will call.     ******Left Lacretia Nicks  voicemail message at Dr. Olevia Perches office to schedule*****   Upper Endoscopy, Adult, Care After This sheet gives you information about how to care for yourself after your procedure. Your health care provider may also give you more specific instructions. If you have problems or questions, contact your health care provider. What can I expect after the procedure? After the procedure, it is common to have:  A sore throat.  Mild stomach pain or discomfort.  Bloating.  Nausea. Follow these instructions at home:   Follow instructions from your health care provider about what to eat or drink after your procedure.  Return to your normal activities as told by your health care provider. Ask your health care provider what activities are safe for you.  Take over-the-counter and prescription medicines only as told by your health care provider.  Do not drive for 24 hours if you were given a sedative during your procedure.  Keep all follow-up visits as told by your health care provider. This is important. Contact a health care provider if you have:  A sore throat that lasts longer than one day.  Trouble swallowing. Get help right away if:  You vomit blood or your vomit looks like coffee grounds.  You have: ? A fever. ? Bloody, black, or tarry stools. ? A severe sore throat or you cannot swallow. ? Difficulty breathing. ? Severe pain in your chest or abdomen. Summary  After the procedure, it is common to have a sore throat, mild stomach discomfort, bloating, and nausea.  Do not drive for 24 hours if you were given a sedative during the procedure.  Follow instructions from your health care provider about what to eat or  drink after your procedure.  Return to your normal activities as told by your health care provider. This information is not intended to replace advice given to you by your health care provider. Make sure you discuss any questions you have with your health care provider. Document Released: 03/19/2012 Document Revised: 03/12/2018 Document Reviewed: 02/18/2018 Elsevier Patient Education  2020 [employer] After These instructions provide you with information about caring for yourself after your procedure. Your health care provider may also give you more specific instructions. Your treatment has been planned according to current medical practices, but problems sometimes occur. Call your health care provider if you have any problems or questions after your procedure. What can I expect after the procedure? After your procedure, you may:  Feel sleepy for several hours.  Feel clumsy and have poor balance for several hours.  Feel forgetful about what happened after the procedure.  Have poor judgment for several hours.  Feel nauseous or vomit.  Have a sore throat if you had a breathing tube during the procedure. Follow these instructions at home: For at least 24 hours after the procedure:      Have a responsible adult stay with you. It is important to have someone help care for you until you are awake and alert.  Rest as needed.  Do not: ? Participate in activities in which you could fall or become injured. ? Drive. ? Use heavy machinery. ? Drink alcohol. ? Take sleeping pills or medicines  that cause drowsiness. ? Make important decisions or sign legal documents. ? Take care of children on your own. Eating and drinking  Follow the diet that is recommended by your health care provider.  If you vomit, drink water, juice, or soup when you can drink without vomiting.  Make sure you have little or no nausea before eating solid foods. General  instructions  Take over-the-counter and prescription medicines only as told by your health care provider.  If you have sleep apnea, surgery and certain medicines can increase your risk for breathing problems. Follow instructions from your health care provider about wearing your sleep device: ? Anytime you are sleeping, including during daytime naps. ? While taking prescription pain medicines, sleeping medicines, or medicines that make you drowsy.  If you smoke, do not smoke without supervision.  Keep all follow-up visits as told by your health care provider. This is important. Contact a health care provider if:  You keep feeling nauseous or you keep vomiting.  You feel light-headed.  You develop a rash.  You have a fever. Get help right away if:  You have trouble breathing. Summary  For several hours after your procedure, you may feel sleepy and have poor judgment.  Have a responsible adult stay with you for at least 24 hours or until you are awake and alert. This information is not intended to replace advice given to you by your health care provider. Make sure you discuss any questions you have with your health care provider. Document Released: 01/09/2016 Document Revised: 12/17/2017 Document Reviewed: 01/09/2016 Elsevier Patient Education  2020 [employer].

## 2019-09-05 NOTE — Anesthesia Postprocedure Evaluation (Signed)
Anesthesia Post Note  Patient: Jody Stokes  Procedure(s) Performed: ESOPHAGOGASTRODUODENOSCOPY (EGD) WITH PROPOFOL (N/A )  Patient location during evaluation: PACU Anesthesia Type: MAC Level of consciousness: awake and awake and alert Pain management: pain level controlled Vital Signs Assessment: post-procedure vital signs reviewed and stable Respiratory status: spontaneous breathing, nonlabored ventilation and respiratory function stable Cardiovascular status: stable Anesthetic complications: no     Last Vitals:  Vitals:   09/05/19 1001  BP: (!) 159/83  Pulse: 81  Resp: 16  Temp: 36.7 C  SpO2: 95%    Last Pain:  Vitals:   09/05/19 1001  TempSrc: Oral  PainSc: 0-No pain                 Krislynn Gronau

## 2019-09-08 ENCOUNTER — Other Ambulatory Visit (INDEPENDENT_AMBULATORY_CARE_PROVIDER_SITE_OTHER): Payer: Self-pay | Admitting: *Deleted

## 2019-09-08 DIAGNOSIS — R1084 Generalized abdominal pain: Secondary | ICD-10-CM

## 2019-09-08 NOTE — Progress Notes (Signed)
CT ordered. 

## 2019-09-09 ENCOUNTER — Encounter (HOSPITAL_COMMUNITY): Payer: Self-pay | Admitting: Internal Medicine

## 2019-09-12 DIAGNOSIS — N944 Primary dysmenorrhea: Secondary | ICD-10-CM | POA: Diagnosis not present

## 2019-09-12 DIAGNOSIS — M0609 Rheumatoid arthritis without rheumatoid factor, multiple sites: Secondary | ICD-10-CM | POA: Diagnosis not present

## 2019-09-12 DIAGNOSIS — R9389 Abnormal findings on diagnostic imaging of other specified body structures: Secondary | ICD-10-CM | POA: Diagnosis not present

## 2019-09-12 DIAGNOSIS — N950 Postmenopausal bleeding: Secondary | ICD-10-CM | POA: Diagnosis not present

## 2019-09-12 DIAGNOSIS — N949 Unspecified condition associated with female genital organs and menstrual cycle: Secondary | ICD-10-CM | POA: Diagnosis not present

## 2019-09-24 ENCOUNTER — Ambulatory Visit (HOSPITAL_COMMUNITY)
Admission: RE | Admit: 2019-09-24 | Discharge: 2019-09-24 | Disposition: A | Discharge status: Home / Self Care | Payer: BC Managed Care – PPO | Source: Ambulatory Visit | Attending: Internal Medicine | Admitting: Internal Medicine

## 2019-09-24 ENCOUNTER — Other Ambulatory Visit: Payer: Self-pay

## 2019-09-24 DIAGNOSIS — R1084 Generalized abdominal pain: Secondary | ICD-10-CM | POA: Diagnosis not present

## 2019-09-24 DIAGNOSIS — R109 Unspecified abdominal pain: Secondary | ICD-10-CM | POA: Diagnosis not present

## 2019-09-24 MED ORDER — IOHEXOL 300 MG/ML  SOLN
100.0000 mL | Freq: Once | INTRAMUSCULAR | Status: AC | PRN
  Administered 2019-09-24: 100 mL via INTRAVENOUS

## 2019-10-14 ENCOUNTER — Encounter (INDEPENDENT_AMBULATORY_CARE_PROVIDER_SITE_OTHER): Payer: Self-pay

## 2019-10-16 ENCOUNTER — Other Ambulatory Visit (HOSPITAL_COMMUNITY): Payer: Self-pay | Admitting: *Deleted

## 2019-10-16 DIAGNOSIS — D509 Iron deficiency anemia, unspecified: Secondary | ICD-10-CM

## 2019-10-16 DIAGNOSIS — E538 Deficiency of other specified B group vitamins: Secondary | ICD-10-CM

## 2019-10-17 ENCOUNTER — Other Ambulatory Visit: Payer: Self-pay

## 2019-10-17 ENCOUNTER — Inpatient Hospital Stay (HOSPITAL_COMMUNITY): Payer: BC Managed Care – PPO | Attending: Hematology

## 2019-10-17 DIAGNOSIS — F329 Major depressive disorder, single episode, unspecified: Secondary | ICD-10-CM | POA: Diagnosis not present

## 2019-10-17 DIAGNOSIS — E538 Deficiency of other specified B group vitamins: Secondary | ICD-10-CM | POA: Diagnosis not present

## 2019-10-17 DIAGNOSIS — Z86711 Personal history of pulmonary embolism: Secondary | ICD-10-CM | POA: Insufficient documentation

## 2019-10-17 DIAGNOSIS — M069 Rheumatoid arthritis, unspecified: Secondary | ICD-10-CM | POA: Insufficient documentation

## 2019-10-17 DIAGNOSIS — M797 Fibromyalgia: Secondary | ICD-10-CM | POA: Diagnosis not present

## 2019-10-17 DIAGNOSIS — D509 Iron deficiency anemia, unspecified: Secondary | ICD-10-CM | POA: Diagnosis not present

## 2019-10-17 DIAGNOSIS — Z79899 Other long term (current) drug therapy: Secondary | ICD-10-CM | POA: Insufficient documentation

## 2019-10-17 LAB — COMPREHENSIVE METABOLIC PANEL
ALT: 29 U/L (ref 0–44)
AST: 28 U/L (ref 15–41)
Albumin: 3.7 g/dL (ref 3.5–5.0)
Alkaline Phosphatase: 114 U/L (ref 38–126)
Anion gap: 6 (ref 5–15)
BUN: 16 mg/dL (ref 6–20)
CO2: 24 mmol/L (ref 22–32)
Calcium: 8.6 mg/dL — ABNORMAL LOW (ref 8.9–10.3)
Chloride: 110 mmol/L (ref 98–111)
Creatinine, Ser: 0.66 mg/dL (ref 0.44–1.00)
GFR calc Af Amer: >60 mL/min (ref >60)
GFR calc non Af Amer: >60 mL/min (ref >60)
Glucose, Bld: 99 mg/dL (ref 70–99)
Potassium: 4.2 mmol/L (ref 3.5–5.1)
Sodium: 140 mmol/L (ref 135–145)
Total Bilirubin: 0.5 mg/dL (ref 0.3–1.2)
Total Protein: 7.1 g/dL (ref 6.5–8.1)

## 2019-10-17 LAB — CBC WITH DIFFERENTIAL/PLATELET
Abs Immature Granulocytes: 0.01 10*3/uL (ref 0.00–0.07)
Basophils Absolute: 0.1 10*3/uL (ref 0.0–0.1)
Basophils Relative: 1 %
Eosinophils Absolute: 0.1 10*3/uL (ref 0.0–0.5)
Eosinophils Relative: 1 %
HCT: 42.1 % (ref 36.0–46.0)
Hemoglobin: 12.9 g/dL (ref 12.0–15.0)
Immature Granulocytes: 0 %
Lymphocytes Relative: 28 %
Lymphs Abs: 2 10*3/uL (ref 0.7–4.0)
MCH: 28 pg (ref 26.0–34.0)
MCHC: 30.6 g/dL (ref 30.0–36.0)
MCV: 91.5 fL (ref 80.0–100.0)
Monocytes Absolute: 0.6 10*3/uL (ref 0.1–1.0)
Monocytes Relative: 8 %
Neutro Abs: 4.5 10*3/uL (ref 1.7–7.7)
Neutrophils Relative %: 62 %
Platelets: 217 10*3/uL (ref 150–400)
RBC: 4.6 MIL/uL (ref 3.87–5.11)
RDW: 14.6 % (ref 11.5–15.5)
WBC: 7.3 10*3/uL (ref 4.0–10.5)
nRBC: 0 % (ref 0.0–0.2)

## 2019-10-17 LAB — FERRITIN: Ferritin: 8 ng/mL — ABNORMAL LOW (ref 11–307)

## 2019-10-17 LAB — LACTATE DEHYDROGENASE: LDH: 196 U/L — ABNORMAL HIGH (ref 98–192)

## 2019-10-20 DIAGNOSIS — Z6841 Body Mass Index (BMI) 40.0 and over, adult: Secondary | ICD-10-CM | POA: Diagnosis not present

## 2019-10-20 DIAGNOSIS — H6093 Unspecified otitis externa, bilateral: Secondary | ICD-10-CM | POA: Diagnosis not present

## 2019-10-21 ENCOUNTER — Ambulatory Visit (INDEPENDENT_AMBULATORY_CARE_PROVIDER_SITE_OTHER): Payer: BC Managed Care – PPO | Admitting: Internal Medicine

## 2019-10-24 DIAGNOSIS — M5126 Other intervertebral disc displacement, lumbar region: Secondary | ICD-10-CM | POA: Diagnosis not present

## 2019-10-24 DIAGNOSIS — G4733 Obstructive sleep apnea (adult) (pediatric): Secondary | ICD-10-CM | POA: Diagnosis not present

## 2019-10-24 DIAGNOSIS — Z79899 Other long term (current) drug therapy: Secondary | ICD-10-CM | POA: Diagnosis not present

## 2019-10-24 DIAGNOSIS — R1013 Epigastric pain: Secondary | ICD-10-CM | POA: Diagnosis not present

## 2019-10-24 DIAGNOSIS — M7918 Myalgia, other site: Secondary | ICD-10-CM | POA: Diagnosis not present

## 2019-10-24 DIAGNOSIS — M797 Fibromyalgia: Secondary | ICD-10-CM | POA: Diagnosis not present

## 2019-10-24 DIAGNOSIS — G8929 Other chronic pain: Secondary | ICD-10-CM | POA: Diagnosis not present

## 2019-10-24 DIAGNOSIS — M0609 Rheumatoid arthritis without rheumatoid factor, multiple sites: Secondary | ICD-10-CM | POA: Diagnosis not present

## 2019-10-31 ENCOUNTER — Inpatient Hospital Stay (HOSPITAL_COMMUNITY): Payer: BC Managed Care – PPO

## 2019-10-31 ENCOUNTER — Encounter (HOSPITAL_COMMUNITY): Payer: Self-pay | Admitting: Nurse Practitioner

## 2019-10-31 ENCOUNTER — Other Ambulatory Visit: Payer: Self-pay

## 2019-10-31 ENCOUNTER — Inpatient Hospital Stay (HOSPITAL_BASED_OUTPATIENT_CLINIC_OR_DEPARTMENT_OTHER): Payer: BC Managed Care – PPO | Admitting: Nurse Practitioner

## 2019-10-31 DIAGNOSIS — Z79899 Other long term (current) drug therapy: Secondary | ICD-10-CM | POA: Diagnosis not present

## 2019-10-31 DIAGNOSIS — E538 Deficiency of other specified B group vitamins: Secondary | ICD-10-CM

## 2019-10-31 DIAGNOSIS — D509 Iron deficiency anemia, unspecified: Secondary | ICD-10-CM | POA: Diagnosis not present

## 2019-10-31 DIAGNOSIS — Z86711 Personal history of pulmonary embolism: Secondary | ICD-10-CM | POA: Diagnosis not present

## 2019-10-31 DIAGNOSIS — D508 Other iron deficiency anemias: Secondary | ICD-10-CM | POA: Diagnosis not present

## 2019-10-31 DIAGNOSIS — M069 Rheumatoid arthritis, unspecified: Secondary | ICD-10-CM | POA: Diagnosis not present

## 2019-10-31 DIAGNOSIS — M797 Fibromyalgia: Secondary | ICD-10-CM | POA: Diagnosis not present

## 2019-10-31 DIAGNOSIS — F329 Major depressive disorder, single episode, unspecified: Secondary | ICD-10-CM | POA: Diagnosis not present

## 2019-10-31 MED ORDER — CYANOCOBALAMIN 1000 MCG/ML IJ SOLN
1000.0000 ug | Freq: Once | INTRAMUSCULAR | Status: AC
  Administered 2019-10-31: 1000 ug via INTRAMUSCULAR

## 2019-10-31 MED ORDER — CYANOCOBALAMIN 1000 MCG/ML IJ SOLN
INTRAMUSCULAR | Status: AC
  Filled 2019-10-31: qty 1

## 2019-10-31 NOTE — Patient Instructions (Signed)
[facility] Discharge Instructions  Follow up in 4 months with labs    Thank you for choosing [facility] at [facility] to provide your oncology and hematology care.  To afford each patient quality time with our provider, please arrive at least 15 minutes before your scheduled appointment time.   If you have a lab appointment with the Cancer Center please come in thru the Main Entrance and check in at the main information desk.  You need to re-schedule your appointment should you arrive 10 or more minutes late.  We strive to give you quality time with our providers, and arriving late affects you and other patients whose appointments are after yours.  Also, if you no show three or more times for appointments you may be dismissed from the clinic at the providers discretion.     Again, thank you for choosing [facility].  Our hope is that these requests will decrease the amount of time that you wait before being seen by our physicians.       _____________________________________________________________  Should you have questions after your visit to  Cancer Center, please contact our office at [phone] between the hours of 8:00 a.m. and 4:30 p.m.  Voicemails left after 4:00 p.m. will not be returned until the following business day.  For prescription refill requests, have your pharmacy contact our office and allow 72 hours.    Due to Covid, you will need to wear a mask upon entering the hospital. If you do not have a mask, a mask will be given to you at the Main Entrance upon arrival. For doctor visits, patients may have 1 support person with them. For treatment visits, patients can not have anyone with them due to social distancing guidelines and our immunocompromised population.      

## 2019-10-31 NOTE — Progress Notes (Signed)
Jody Stokes, [place] [id number]   CLINIC:  Medical Oncology/Hematology  PCP:  Denny Levy, [place] [address] [phone]   REASON FOR VISIT: Follow-up for iron deficiency anemia  CURRENT THERAPY: Intermittent iron infusions   INTERVAL HISTORY:  Jody Stokes 59 y.o. female returns for routine follow-up for iron deficiency anemia.  Patient reports she is doing well since her last visit.  She denies any bright red bleeding per rectum or melena.  She denies any easy bruising or bleeding. Denies any nausea, vomiting, or diarrhea. Denies any new pains. Had not noticed any recent bleeding such as epistaxis, hematuria or hematochezia. Denies recent chest pain on exertion, shortness of breath on minimal exertion, pre-syncopal episodes, or palpitations. Denies any numbness or tingling in hands or feet. Denies any recent fevers, infections, or recent hospitalizations. Patient reports appetite at 100% and energy level at 25%.  She is eating well maintain her weight at this time.     REVIEW OF SYSTEMS:  Review of Systems  Constitutional: Positive for fatigue.  Gastrointestinal: Positive for diarrhea (IBS).  Neurological: Positive for headaches.  Psychiatric/Behavioral: Positive for sleep disturbance.     PAST MEDICAL/SURGICAL HISTORY:  Past Medical History:  Diagnosis Date  . Anemia   . B12 deficiency 03/14/2016  . Congenital heart disease   . Depression   . Fibromyalgia   . Gastric bypass status for obesity 03/14/2016  . Iron deficiency anemia 03/14/2016  . Presence of IVC filter   . Pulmonary embolism ([place])   . Rheumatoid arthritis (H[facility])    Rheumatoid  . Sleep apnea    uses CPAP  . Tubular adenoma of colon    Past Surgical History:  Procedure Laterality Date  . BIOPSY N/A 11/19/2014   Procedure: BIOPSY;  Surgeon: Daneil Dolin, MD;  Location: [facility];  Service: Endoscopy;  Laterality: N/A;  Ascending Colon  . BIOPSY   09/22/2016   Procedure: BIOPSY;  Surgeon: Rogene Houston, MD;  Location: [facility];  Service: Endoscopy;;  small bowel  . BIOPSY  03/08/2018   Procedure: BIOPSY;  Surgeon: Rogene Houston, MD;  Location: [facility];  Service: Endoscopy;;  colon  . CARPAL TUNNEL RELEASE Bilateral   . CHOLECYSTECTOMY  2009  . COLONOSCOPY  07/2012   transverse colon 5 mm polyp removed  . COLONOSCOPY WITH PROPOFOL N/A 11/19/2014   RMR: mutilple colonic polyps removed as described above. Pancolonic diverticulosis(few).  Status post mucosal biopsy to assess for microscopic coliits. /   . COLONOSCOPY WITH PROPOFOL N/A 03/08/2018   Procedure: COLONOSCOPY WITH PROPOFOL;  Surgeon: Rogene Houston, MD;  Location: [facility];  Service: Endoscopy;  Laterality: N/A;  . ESOPHAGOGASTRODUODENOSCOPY N/A 09/22/2016   Procedure: ESOPHAGOGASTRODUODENOSCOPY (EGD);  Surgeon: Rogene Houston, MD;  Location: [facility];  Service: Endoscopy;  Laterality: N/A;  1:15  . ESOPHAGOGASTRODUODENOSCOPY (EGD) WITH PROPOFOL N/A 09/05/2019   Procedure: ESOPHAGOGASTRODUODENOSCOPY (EGD) WITH PROPOFOL;  Surgeon: Rogene Houston, MD;  Location: [facility];  Service: Endoscopy;  Laterality: N/A;  1005am  . FOOT SURGERY  2015   left and right  . GASTRIC BYPASS  2008  . HERNIA REPAIR    . HIATAL HERNIA REPAIR  2007  . POLYPECTOMY N/A 11/19/2014   Procedure: POLYPECTOMY;  Surgeon: Daneil Dolin, MD;  Location: [facility];  Service: Endoscopy;  Laterality: N/A;  ileocecal valve, Hepatic Flexure, Splenic Flexure, Descending Colon   . TUBAL  LIGATION       SOCIAL HISTORY:  Social History   Socioeconomic History  . Marital status: Married    Spouse name: Not on file  . Number of children: 3  . Years of education: Not on file  . Highest education level: Not on file  Occupational History  . Not on file  Tobacco Use  . Smoking status: Never Smoker  . Smokeless tobacco: Never Used  Substance and Sexual Activity  . Alcohol use: No     Alcohol/week: 0.0 standard drinks  . Drug use: No  . Sexual activity: Yes    Birth control/protection: Surgical  Other Topics Concern  . Not on file  Social History Narrative   Lives with husband.     Social Determinants of Health   Financial Resource Strain:   . Difficulty of Paying Living Expenses: Not on file  Food Insecurity:   . Worried About Charity fundraiser in the Last Year: Not on file  . Ran Out of Food in the Last Year: Not on file  Transportation Needs:   . Lack of Transportation (Medical): Not on file  . Lack of Transportation (Non-Medical): Not on file  Physical Activity:   . Days of Exercise per Week: Not on file  . Minutes of Exercise per Session: Not on file  Stress:   . Feeling of Stress : Not on file  Social Connections:   . Frequency of Communication with Friends and Family: Not on file  . Frequency of Social Gatherings with Friends and Family: Not on file  . Attends Religious Services: Not on file  . Active Member of Clubs or Organizations: Not on file  . Attends Archivist Meetings: Not on file  . Marital Status: Not on file  Intimate Partner Violence:   . Fear of Current or Ex-Partner: Not on file  . Emotionally Abused: Not on file  . Physically Abused: Not on file  . Sexually Abused: Not on file    FAMILY HISTORY:  Family History  Problem Relation Age of Onset  . HIV Sister   . Hypertension Brother   . Diabetes Brother   . Kidney disease Brother   . Hypertension Brother   . Heart Problems Brother        No details.   . Hypertension Brother   . Colon cancer Neg Hx     CURRENT MEDICATIONS:  Outpatient Encounter Medications as of 10/31/2019  Medication Sig  . cholestyramine light (PREVALITE) 4 g packet MIX AND DRINK 1 PACKET BY MOUTH TWICE DAILY  . diphenhydramine-acetaminophen (TYLENOL PM) 25-500 MG TABS tablet Take 2 tablets by mouth at bedtime.   . DULoxetine (CYMBALTA) 60 MG capsule Take 60 mg by mouth at bedtime.   Marland Kitchen  golimumab (SIMPONI ARIA) 50 MG/4ML SOLN injection Inject 50 mg into the vein every 8 (eight) weeks.   . modafinil (PROVIGIL) 200 MG tablet Take 200 mg by mouth 2 (two) times daily.  . montelukast (SINGULAIR) 10 MG tablet TAKE 1 TABLET(10 MG) BY MOUTH AT BEDTIME (Patient taking differently: Take 10 mg by mouth at bedtime. )  . omeprazole (PRILOSEC) 20 MG capsule Take 20 mg by mouth 2 (two) times daily before a meal.   . pregabalin (LYRICA) 75 MG capsule Take 75 mg by mouth 2 (two) times daily.  . cyclobenzaprine (FLEXERIL) 10 MG tablet Take 10 mg by mouth at bedtime as needed.  . [DISCONTINUED] OTREXUP 15 MG/0.4ML SOAJ Inject 15 mg into the skin  every 7 (seven) days.    No facility-administered encounter medications on file as of 10/31/2019.    ALLERGIES:  Allergies  Allergen Reactions  . Viberzi [Eluxadoline] Other (See Comments)    Patient states that she has extreme abdominal cramping.     PHYSICAL EXAM:  ECOG Performance status: 1  Vitals:   10/31/19 1020  BP: (!) 142/79  Pulse: 99  Resp: 20  Temp: (!) 97.1 F (36.2 C)  SpO2: 99%   Filed Weights   10/31/19 1020  Weight: 292 lb 11.2 oz (132.8 kg)    Physical Exam Constitutional:      Appearance: Normal appearance. She is normal weight.  Cardiovascular:     Rate and Rhythm: Normal rate and regular rhythm.     Heart sounds: Normal heart sounds.  Pulmonary:     Effort: Pulmonary effort is normal.     Breath sounds: Normal breath sounds.  Abdominal:     General: Bowel sounds are normal.     Palpations: Abdomen is soft.  Musculoskeletal:        General: Normal range of motion.  Skin:    General: Skin is warm.  Neurological:     Mental Status: She is alert and oriented to person, place, and time. Mental status is at baseline.  Psychiatric:        Mood and Affect: Mood normal.        Behavior: Behavior normal.        Thought Content: Thought content normal.        Judgment: Judgment normal.      LABORATORY  DATA:  I have reviewed the labs as listed.  CBC    Component Value Date/Time   WBC 7.3 10/17/2019 1018   RBC 4.60 10/17/2019 1018   HGB 12.9 10/17/2019 1018   HCT 42.1 10/17/2019 1018   PLT 217 10/17/2019 1018   MCV 91.5 10/17/2019 1018   MCH 28.0 10/17/2019 1018   MCHC 30.6 10/17/2019 1018   RDW 14.6 10/17/2019 1018   LYMPHSABS 2.0 10/17/2019 1018   MONOABS 0.6 10/17/2019 1018   EOSABS 0.1 10/17/2019 1018   BASOSABS 0.1 10/17/2019 1018   CMP Latest Ref Rng & Units 10/17/2019 07/08/2019 05/10/2018  Glucose 70 - 99 mg/dL 99 84 86  BUN 6 - 20 mg/dL 16 12 19   Creatinine 0.44 - 1.00 mg/dL 0.66 0.74 0.74  Sodium 135 - 145 mmol/L 140 142 139  Potassium 3.5 - 5.1 mmol/L 4.2 4.4 4.1  Chloride 98 - 111 mmol/L 110 104 108  CO2 22 - 32 mmol/L 24 27 25   Calcium 8.9 - 10.3 mg/dL 8.6(L) 9.0 8.6(L)  Total Protein 6.5 - 8.1 g/dL 7.1 6.8 6.9  Total Bilirubin 0.3 - 1.2 mg/dL 0.5 0.7 0.6  Alkaline Phos 38 - 126 U/L 114 - 99  AST 15 - 41 U/L 28 23 33  ALT 0 - 44 U/L 29 27 39    I personally performed a face-to-face visit.  All questions were answered to patient's stated satisfaction. Encouraged patient to call with any new concerns or questions before his next visit to the cancer center and we can certain see him sooner, if needed.     ASSESSMENT & PLAN:   Iron deficiency anemia 1.  Iron deficiency anemia: -Likely from gastric bypass surgery. -Last Feraheme infusion was 10/08/2017 -Patient denies any bright red bleeding per rectum or melena. -Labs done on 10/17/2019 showed hemoglobin 12.9, ferritin 8. -Due to the patient's extreme fatigue we will  give her 2 infusions of IV iron. -We will see her back in 4 months with repeat labs.  2.  B12 deficiency: -Patient was getting monthly B12 injections.  However patient stopped coming to the clinic to receive her injections. -Patient is requesting her B12 injection today.  We will give the injection today -B12 will be checked on her next  visit. -Patient wishes to discontinue monthly injections and check B12 at her next visit.      Orders placed this encounter:  Orders Placed This Encounter  Procedures  . Lactate dehydrogenase  . CBC with Differential/Platelet  . Comprehensive metabolic panel  . Ferritin  . Iron and TIBC  . Vitamin B12  . VITAMIN D 25 Hydroxy (Vit-D Deficiency, Fractures)  . Folate      Francene Finders, FNP-C [place] [phone]

## 2019-10-31 NOTE — Assessment & Plan Note (Addendum)
1.  Iron deficiency anemia: -Likely from gastric bypass surgery. -Last Feraheme infusion was 10/08/2017 -Patient denies any bright red bleeding per rectum or melena. -Labs done on 10/17/2019 showed hemoglobin 12.9, ferritin 8. -Due to the patient's extreme fatigue we will give her 2 infusions of IV iron. -We will see her back in 4 months with repeat labs.  2.  B12 deficiency: -Patient was getting monthly B12 injections.  However patient stopped coming to the clinic to receive her injections. -Patient is requesting her B12 injection today.  We will give the injection today -B12 will be checked on her next visit. -Patient wishes to discontinue monthly injections and check B12 at her next visit.

## 2019-11-10 ENCOUNTER — Other Ambulatory Visit (INDEPENDENT_AMBULATORY_CARE_PROVIDER_SITE_OTHER): Payer: Self-pay | Admitting: Internal Medicine

## 2019-11-14 ENCOUNTER — Other Ambulatory Visit (INDEPENDENT_AMBULATORY_CARE_PROVIDER_SITE_OTHER): Payer: Self-pay | Admitting: Internal Medicine

## 2019-11-14 ENCOUNTER — Ambulatory Visit (HOSPITAL_COMMUNITY): Payer: BC Managed Care – PPO

## 2019-11-21 ENCOUNTER — Ambulatory Visit (HOSPITAL_COMMUNITY): Payer: BC Managed Care – PPO

## 2019-11-26 DIAGNOSIS — Z23 Encounter for immunization: Secondary | ICD-10-CM | POA: Diagnosis not present

## 2019-11-27 ENCOUNTER — Inpatient Hospital Stay (HOSPITAL_COMMUNITY): Payer: BC Managed Care – PPO | Attending: Hematology

## 2019-11-27 ENCOUNTER — Encounter (HOSPITAL_COMMUNITY): Payer: Self-pay

## 2019-11-27 ENCOUNTER — Other Ambulatory Visit: Payer: Self-pay

## 2019-11-27 VITALS — BP 133/79 | HR 80 | Temp 98.1°F | Resp 18

## 2019-11-27 DIAGNOSIS — E538 Deficiency of other specified B group vitamins: Secondary | ICD-10-CM

## 2019-11-27 DIAGNOSIS — D509 Iron deficiency anemia, unspecified: Secondary | ICD-10-CM | POA: Diagnosis not present

## 2019-11-27 MED ORDER — SODIUM CHLORIDE 0.9 % IV SOLN: Freq: Once | INTRAVENOUS | Status: AC

## 2019-11-27 MED ORDER — CYANOCOBALAMIN 1000 MCG/ML IJ SOLN: 1000.0000 ug | Freq: Once | INTRAMUSCULAR | Status: DC

## 2019-11-27 MED ORDER — SODIUM CHLORIDE 0.9 % IV SOLN
510.0000 mg | Freq: Once | INTRAVENOUS | Status: AC
  Administered 2019-11-27: 510 mg via INTRAVENOUS
  Filled 2019-11-27: qty 510

## 2019-11-27 NOTE — Patient Instructions (Signed)
[facility]  Discharge Instructions:   _______________________________________________________________  Thank you for choosing [facility] at [facility] to provide your oncology and hematology care.  To afford each patient quality time with our providers, please arrive at least 15 minutes before your scheduled appointment.  You need to re-schedule your appointment if you arrive 10 or more minutes late.  We strive to give you quality time with our providers, and arriving late affects you and other patients whose appointments are after yours.  Also, if you no show three or more times for appointments you may be dismissed from the clinic.  Again, thank you for choosing [facility] at [facility]. Our hope is that these requests will allow you access to exceptional care and in a timely manner. _______________________________________________________________  If you have questions after your visit, please contact our office at [phone] between the hours of 8:30 a.m. and 5:00 p.m. Voicemails left after 4:30 p.m. will not be returned until the following business day. _______________________________________________________________  For prescription refill requests, have your pharmacy contact our office. _______________________________________________________________  Recommendations made by the consultant and any test results will be sent to your referring physician. _______________________________________________________________ 

## 2019-11-27 NOTE — Progress Notes (Signed)
Patient here today for iron and b12 injection. Per NP note, she is going to check her B12 level next visit and discuss if she needs to start back on B12 injections monthly. Patient refused injection today.   Iron given per orders. Patient tolerated it well without problems. Vitals stable and discharged home from clinic ambulatory. Follow up as scheduled.

## 2019-11-28 DIAGNOSIS — M0609 Rheumatoid arthritis without rheumatoid factor, multiple sites: Secondary | ICD-10-CM | POA: Diagnosis not present

## 2019-12-04 ENCOUNTER — Other Ambulatory Visit: Payer: Self-pay

## 2019-12-04 ENCOUNTER — Inpatient Hospital Stay (HOSPITAL_COMMUNITY): Payer: BC Managed Care – PPO | Attending: Hematology

## 2019-12-04 VITALS — BP 149/72 | HR 89 | Temp 97.6°F | Resp 18

## 2019-12-04 DIAGNOSIS — D509 Iron deficiency anemia, unspecified: Secondary | ICD-10-CM | POA: Diagnosis not present

## 2019-12-04 DIAGNOSIS — E538 Deficiency of other specified B group vitamins: Secondary | ICD-10-CM

## 2019-12-04 MED ORDER — SODIUM CHLORIDE 0.9 % IV SOLN: Freq: Once | INTRAVENOUS | Status: AC

## 2019-12-04 MED ORDER — SODIUM CHLORIDE 0.9 % IV SOLN
510.0000 mg | Freq: Once | INTRAVENOUS | Status: AC
  Administered 2019-12-04: 510 mg via INTRAVENOUS
  Filled 2019-12-04: qty 510

## 2019-12-04 NOTE — Patient Instructions (Signed)
[facility]  Discharge Instructions:   _______________________________________________________________  Thank you for choosing [facility] at [facility] to provide your oncology and hematology care.  To afford each patient quality time with our providers, please arrive at least 15 minutes before your scheduled appointment.  You need to re-schedule your appointment if you arrive 10 or more minutes late.  We strive to give you quality time with our providers, and arriving late affects you and other patients whose appointments are after yours.  Also, if you no show three or more times for appointments you may be dismissed from the clinic.  Again, thank you for choosing [facility] at [facility]. Our hope is that these requests will allow you access to exceptional care and in a timely manner. _______________________________________________________________  If you have questions after your visit, please contact our office at [phone] between the hours of 8:30 a.m. and 5:00 p.m. Voicemails left after 4:30 p.m. will not be returned until the following business day. _______________________________________________________________  For prescription refill requests, have your pharmacy contact our office. _______________________________________________________________  Recommendations made by the consultant and any test results will be sent to your referring physician. _______________________________________________________________ 

## 2019-12-04 NOTE — Progress Notes (Signed)
Patient presents today for Feraheme infusion. Patient has no complaints of any changes since her last visit. Vital signs stable.    Feraheme given today per MD orders. Tolerated infusion without adverse affects. Vital signs stable. No complaints at this time. Discharged from clinic ambulatory. F/U with [facility] as scheduled.

## 2019-12-04 NOTE — Progress Notes (Signed)
Feraheme given today per MD orders. Tolerated infusion without adverse affects. Vital signs stable. No complaints at this time. Discharged from clinic ambulatory. F/U with  Cancer Center as scheduled.  

## 2019-12-24 DIAGNOSIS — Z23 Encounter for immunization: Secondary | ICD-10-CM | POA: Diagnosis not present

## 2020-01-19 ENCOUNTER — Other Ambulatory Visit (INDEPENDENT_AMBULATORY_CARE_PROVIDER_SITE_OTHER): Payer: Self-pay | Admitting: Gastroenterology

## 2020-01-23 DIAGNOSIS — M0609 Rheumatoid arthritis without rheumatoid factor, multiple sites: Secondary | ICD-10-CM | POA: Diagnosis not present

## 2020-02-26 DIAGNOSIS — Z20828 Contact with and (suspected) exposure to other viral communicable diseases: Secondary | ICD-10-CM | POA: Diagnosis not present

## 2020-02-27 ENCOUNTER — Inpatient Hospital Stay (HOSPITAL_COMMUNITY): Payer: BC Managed Care – PPO | Attending: Hematology

## 2020-03-05 ENCOUNTER — Ambulatory Visit (HOSPITAL_COMMUNITY): Payer: BC Managed Care – PPO | Admitting: Nurse Practitioner

## 2020-03-19 DIAGNOSIS — M0609 Rheumatoid arthritis without rheumatoid factor, multiple sites: Secondary | ICD-10-CM | POA: Diagnosis not present

## 2020-03-19 DIAGNOSIS — Z111 Encounter for screening for respiratory tuberculosis: Secondary | ICD-10-CM | POA: Diagnosis not present

## 2020-03-19 DIAGNOSIS — Z79899 Other long term (current) drug therapy: Secondary | ICD-10-CM | POA: Diagnosis not present

## 2020-03-26 ENCOUNTER — Other Ambulatory Visit (INDEPENDENT_AMBULATORY_CARE_PROVIDER_SITE_OTHER): Payer: Self-pay | Admitting: Internal Medicine

## 2020-05-03 DIAGNOSIS — R5383 Other fatigue: Secondary | ICD-10-CM | POA: Diagnosis not present

## 2020-05-03 DIAGNOSIS — Z0000 Encounter for general adult medical examination without abnormal findings: Secondary | ICD-10-CM | POA: Diagnosis not present

## 2020-05-03 DIAGNOSIS — Z13220 Encounter for screening for lipoid disorders: Secondary | ICD-10-CM | POA: Diagnosis not present

## 2020-05-03 DIAGNOSIS — R0602 Shortness of breath: Secondary | ICD-10-CM | POA: Diagnosis not present

## 2020-05-03 DIAGNOSIS — Z131 Encounter for screening for diabetes mellitus: Secondary | ICD-10-CM | POA: Diagnosis not present

## 2020-05-14 DIAGNOSIS — M0609 Rheumatoid arthritis without rheumatoid factor, multiple sites: Secondary | ICD-10-CM | POA: Diagnosis not present

## 2020-05-22 DIAGNOSIS — Z20828 Contact with and (suspected) exposure to other viral communicable diseases: Secondary | ICD-10-CM | POA: Diagnosis not present

## 2020-06-01 DIAGNOSIS — Z20828 Contact with and (suspected) exposure to other viral communicable diseases: Secondary | ICD-10-CM | POA: Diagnosis not present

## 2020-06-05 ENCOUNTER — Other Ambulatory Visit (INDEPENDENT_AMBULATORY_CARE_PROVIDER_SITE_OTHER): Payer: Self-pay | Admitting: Gastroenterology

## 2020-06-10 ENCOUNTER — Other Ambulatory Visit (INDEPENDENT_AMBULATORY_CARE_PROVIDER_SITE_OTHER): Payer: Self-pay | Admitting: Gastroenterology

## 2020-06-10 DIAGNOSIS — R635 Abnormal weight gain: Secondary | ICD-10-CM | POA: Diagnosis not present

## 2020-06-10 DIAGNOSIS — R945 Abnormal results of liver function studies: Secondary | ICD-10-CM | POA: Diagnosis not present

## 2020-06-10 MED ORDER — CHOLESTYRAMINE LIGHT 4 G PO PACK: 4.0000 g | PACK | Freq: Two times a day (BID) | ORAL | 3 refills | Status: DC

## 2020-06-10 NOTE — Progress Notes (Unsigned)
Refill of cholestyramine sent to pharmacy

## 2020-06-25 DIAGNOSIS — G8929 Other chronic pain: Secondary | ICD-10-CM | POA: Diagnosis not present

## 2020-06-25 DIAGNOSIS — R945 Abnormal results of liver function studies: Secondary | ICD-10-CM | POA: Diagnosis not present

## 2020-06-25 DIAGNOSIS — R635 Abnormal weight gain: Secondary | ICD-10-CM | POA: Diagnosis not present

## 2020-07-02 ENCOUNTER — Other Ambulatory Visit (INDEPENDENT_AMBULATORY_CARE_PROVIDER_SITE_OTHER): Payer: Self-pay | Admitting: Gastroenterology

## 2020-07-02 NOTE — Telephone Encounter (Signed)
Last seen Jul 08, 2019 here by Quintin Alto . Please advise.

## 2020-07-11 DIAGNOSIS — J9811 Atelectasis: Secondary | ICD-10-CM | POA: Diagnosis not present

## 2020-07-11 DIAGNOSIS — R9431 Abnormal electrocardiogram [ECG] [EKG]: Secondary | ICD-10-CM | POA: Diagnosis not present

## 2020-07-11 DIAGNOSIS — R42 Dizziness and giddiness: Secondary | ICD-10-CM | POA: Diagnosis not present

## 2020-07-11 DIAGNOSIS — K589 Irritable bowel syndrome without diarrhea: Secondary | ICD-10-CM | POA: Diagnosis not present

## 2020-07-11 DIAGNOSIS — Z9884 Bariatric surgery status: Secondary | ICD-10-CM | POA: Diagnosis not present

## 2020-07-11 DIAGNOSIS — M069 Rheumatoid arthritis, unspecified: Secondary | ICD-10-CM | POA: Diagnosis not present

## 2020-07-13 DIAGNOSIS — R42 Dizziness and giddiness: Secondary | ICD-10-CM | POA: Diagnosis not present

## 2020-07-23 DIAGNOSIS — G8929 Other chronic pain: Secondary | ICD-10-CM | POA: Diagnosis not present

## 2020-07-23 DIAGNOSIS — M25569 Pain in unspecified knee: Secondary | ICD-10-CM | POA: Diagnosis not present

## 2020-07-23 DIAGNOSIS — R5383 Other fatigue: Secondary | ICD-10-CM | POA: Diagnosis not present

## 2020-07-23 DIAGNOSIS — R635 Abnormal weight gain: Secondary | ICD-10-CM | POA: Diagnosis not present

## 2020-08-05 DIAGNOSIS — M5431 Sciatica, right side: Secondary | ICD-10-CM | POA: Diagnosis not present

## 2020-08-05 DIAGNOSIS — M545 Low back pain, unspecified: Secondary | ICD-10-CM | POA: Diagnosis not present

## 2020-08-23 DIAGNOSIS — M461 Sacroiliitis, not elsewhere classified: Secondary | ICD-10-CM | POA: Diagnosis not present

## 2020-08-23 DIAGNOSIS — M545 Low back pain, unspecified: Secondary | ICD-10-CM | POA: Diagnosis not present

## 2020-08-23 DIAGNOSIS — Z6841 Body Mass Index (BMI) 40.0 and over, adult: Secondary | ICD-10-CM | POA: Diagnosis not present

## 2020-09-03 DIAGNOSIS — M545 Low back pain, unspecified: Secondary | ICD-10-CM | POA: Diagnosis not present

## 2020-09-03 DIAGNOSIS — R635 Abnormal weight gain: Secondary | ICD-10-CM | POA: Diagnosis not present

## 2020-09-03 DIAGNOSIS — Z9189 Other specified personal risk factors, not elsewhere classified: Secondary | ICD-10-CM | POA: Diagnosis not present

## 2020-09-06 DIAGNOSIS — M5136 Other intervertebral disc degeneration, lumbar region: Secondary | ICD-10-CM | POA: Diagnosis not present

## 2020-09-06 DIAGNOSIS — M62830 Muscle spasm of back: Secondary | ICD-10-CM | POA: Diagnosis not present

## 2020-09-06 DIAGNOSIS — M545 Low back pain, unspecified: Secondary | ICD-10-CM | POA: Diagnosis not present

## 2020-09-06 DIAGNOSIS — M546 Pain in thoracic spine: Secondary | ICD-10-CM | POA: Diagnosis not present

## 2020-09-06 DIAGNOSIS — J069 Acute upper respiratory infection, unspecified: Secondary | ICD-10-CM | POA: Diagnosis not present

## 2020-09-06 DIAGNOSIS — M47816 Spondylosis without myelopathy or radiculopathy, lumbar region: Secondary | ICD-10-CM | POA: Diagnosis not present

## 2020-09-06 DIAGNOSIS — Z20828 Contact with and (suspected) exposure to other viral communicable diseases: Secondary | ICD-10-CM | POA: Diagnosis not present

## 2020-09-13 DIAGNOSIS — Z23 Encounter for immunization: Secondary | ICD-10-CM | POA: Diagnosis not present

## 2020-11-25 ENCOUNTER — Other Ambulatory Visit (INDEPENDENT_AMBULATORY_CARE_PROVIDER_SITE_OTHER): Payer: Self-pay | Admitting: Internal Medicine

## 2020-11-25 MED ORDER — CHOLESTYRAMINE LIGHT 4 G PO PACK: 4.0000 g | PACK | Freq: Two times a day (BID) | ORAL | 5 refills | Status: DC

## 2020-11-25 NOTE — Progress Notes (Signed)
Prescription for cholestyramine filled. 1 month supply with 5 refills She will need follow-up visit prior to next refills

## 2021-05-19 ENCOUNTER — Other Ambulatory Visit (INDEPENDENT_AMBULATORY_CARE_PROVIDER_SITE_OTHER): Payer: Self-pay

## 2021-05-19 ENCOUNTER — Other Ambulatory Visit (INDEPENDENT_AMBULATORY_CARE_PROVIDER_SITE_OTHER): Payer: Self-pay | Admitting: Internal Medicine

## 2021-05-19 DIAGNOSIS — R1013 Epigastric pain: Secondary | ICD-10-CM

## 2021-05-19 DIAGNOSIS — R1084 Generalized abdominal pain: Secondary | ICD-10-CM

## 2021-05-19 MED ORDER — CHOLESTYRAMINE LIGHT 4 G PO PACK: 4.0000 g | PACK | Freq: Two times a day (BID) | ORAL | 0 refills | Status: DC

## 2021-05-26 ENCOUNTER — Ambulatory Visit (INDEPENDENT_AMBULATORY_CARE_PROVIDER_SITE_OTHER): Payer: BC Managed Care – PPO | Admitting: Gastroenterology

## 2021-05-26 ENCOUNTER — Encounter (INDEPENDENT_AMBULATORY_CARE_PROVIDER_SITE_OTHER): Payer: Self-pay | Admitting: Gastroenterology

## 2021-08-04 ENCOUNTER — Other Ambulatory Visit: Payer: Self-pay | Admitting: Endocrinology

## 2021-08-04 DIAGNOSIS — Z1231 Encounter for screening mammogram for malignant neoplasm of breast: Secondary | ICD-10-CM

## 2021-08-05 ENCOUNTER — Encounter (HOSPITAL_COMMUNITY): Payer: Self-pay | Admitting: Hematology

## 2021-08-08 ENCOUNTER — Encounter (HOSPITAL_COMMUNITY): Payer: Self-pay | Admitting: Hematology

## 2021-08-08 ENCOUNTER — Ambulatory Visit (INDEPENDENT_AMBULATORY_CARE_PROVIDER_SITE_OTHER): Payer: BC Managed Care – PPO

## 2021-08-08 ENCOUNTER — Ambulatory Visit: Payer: BC Managed Care – PPO | Admitting: Podiatry

## 2021-08-08 ENCOUNTER — Other Ambulatory Visit: Payer: Self-pay

## 2021-08-08 DIAGNOSIS — M7730 Calcaneal spur, unspecified foot: Secondary | ICD-10-CM

## 2021-08-08 DIAGNOSIS — M722 Plantar fascial fibromatosis: Secondary | ICD-10-CM

## 2021-08-08 MED ORDER — TRIAMCINOLONE ACETONIDE 10 MG/ML IJ SUSP
10.0000 mg | Freq: Once | INTRAMUSCULAR | Status: AC
  Administered 2021-08-08: 10 mg

## 2021-08-08 NOTE — Patient Instructions (Signed)
For instructions on how to put on your Plantar Fascial Brace, please visit [email or web address]   Plantar Fasciitis (Heel Spur Syndrome) with Rehab The plantar fascia is a fibrous, ligament-like, soft-tissue structure that spans the bottom of the foot. Plantar fasciitis is a condition that causes pain in the foot due to inflammation of the tissue. SYMPTOMS   Pain and tenderness on the underneath side of the foot.  Pain that worsens with standing or walking. CAUSES  Plantar fasciitis is caused by irritation and injury to the plantar fascia on the underneath side of the foot. Common mechanisms of injury include:  Direct trauma to bottom of the foot.  Damage to a small nerve that runs under the foot where the main fascia attaches to the heel bone.  Stress placed on the plantar fascia due to bone spurs. RISK INCREASES WITH:   Activities that place stress on the plantar fascia (running, jumping, pivoting, or cutting).  Poor strength and flexibility.  Improperly fitted shoes.  Tight calf muscles.  Flat feet.  Failure to warm-up properly before activity.  Obesity. PREVENTION  Warm up and stretch properly before activity.  Allow for adequate recovery between workouts.  Maintain physical fitness:  Strength, flexibility, and endurance.  Cardiovascular fitness.  Maintain a health body weight.  Avoid stress on the plantar fascia.  Wear properly fitted shoes, including arch supports for individuals who have flat feet.  PROGNOSIS  If treated properly, then the symptoms of plantar fasciitis usually resolve without surgery. However, occasionally surgery is necessary.  RELATED COMPLICATIONS   Recurrent symptoms that may result in a chronic condition.  Problems of the lower back that are caused by compensating for the injury, such as limping.  Pain or weakness of the foot during push-off following surgery.  Chronic inflammation, scarring, and partial or complete  fascia tear, occurring more often from repeated injections.  TREATMENT  Treatment initially involves the use of ice and medication to help reduce pain and inflammation. The use of strengthening and stretching exercises may help reduce pain with activity, especially stretches of the Achilles tendon. These exercises may be performed at home or with a therapist. Your caregiver may recommend that you use heel cups of arch supports to help reduce stress on the plantar fascia. Occasionally, corticosteroid injections are given to reduce inflammation. If symptoms persist for greater than 6 months despite non-surgical (conservative), then surgery may be recommended.   MEDICATION   If pain medication is necessary, then nonsteroidal anti-inflammatory medications, such as aspirin and ibuprofen, or other minor pain relievers, such as acetaminophen, are often recommended.  Do not take pain medication within 7 days before surgery.  Prescription pain relievers may be given if deemed necessary by your caregiver. Use only as directed and only as much as you need.  Corticosteroid injections may be given by your caregiver. These injections should be reserved for the most serious cases, because they may only be given a certain number of times.  HEAT AND COLD  Cold treatment (icing) relieves pain and reduces inflammation. Cold treatment should be applied for 10 to 15 minutes every 2 to 3 hours for inflammation and pain and immediately after any activity that aggravates your symptoms. Use ice packs or massage the area with a piece of ice (ice massage).  Heat treatment may be used prior to performing the stretching and strengthening activities prescribed by your caregiver, physical therapist, or athletic trainer. Use a heat pack or soak the injury in warm water.  SEEK IMMEDIATE MEDICAL   CARE IF:  Treatment seems to offer no benefit, or the condition worsens.  Any medications produce adverse side effects.   EXERCISES- RANGE OF MOTION (ROM) AND STRETCHING EXERCISES - Plantar Fasciitis (Heel Spur Syndrome) These exercises may help you when beginning to rehabilitate your injury. Your symptoms may resolve with or without further involvement from your physician, physical therapist or athletic trainer. While completing these exercises, remember:   Restoring tissue flexibility helps normal motion to return to the joints. This allows healthier, less painful movement and activity.  An effective stretch should be held for at least 30 seconds.  A stretch should never be painful. You should only feel a gentle lengthening or release in the stretched tissue.  RANGE OF MOTION - Toe Extension, Flexion  Sit with your right / left leg crossed over your opposite knee.  Grasp your toes and gently pull them back toward the top of your foot. You should feel a stretch on the bottom of your toes and/or foot.  Hold this stretch for 10 seconds.  Now, gently pull your toes toward the bottom of your foot. You should feel a stretch on the top of your toes and or foot.  Hold this stretch for 10 seconds. Repeat  times. Complete this stretch 3 times per day.   RANGE OF MOTION - Ankle Dorsiflexion, Active Assisted  Remove shoes and sit on a chair that is preferably not on a carpeted surface.  Place right / left foot under knee. Extend your opposite leg for support.  Keeping your heel down, slide your right / left foot back toward the chair until you feel a stretch at your ankle or calf. If you do not feel a stretch, slide your bottom forward to the edge of the chair, while still keeping your heel down.  Hold this stretch for 10 seconds. Repeat 3 times. Complete this stretch 2 times per day.   STRETCH  Gastroc, Standing  Place hands on wall.  Extend right / left leg, keeping the front knee somewhat bent.  Slightly point your toes inward on your back foot.  Keeping your right / left heel on the floor and your  knee straight, shift your weight toward the wall, not allowing your back to arch.  You should feel a gentle stretch in the right / left calf. Hold this position for 10 seconds. Repeat 3 times. Complete this stretch 2 times per day.  STRETCH  Soleus, Standing  Place hands on wall.  Extend right / left leg, keeping the other knee somewhat bent.  Slightly point your toes inward on your back foot.  Keep your right / left heel on the floor, bend your back knee, and slightly shift your weight over the back leg so that you feel a gentle stretch deep in your back calf.  Hold this position for 10 seconds. Repeat 3 times. Complete this stretch 2 times per day.  STRETCH  Gastrocsoleus, Standing  Note: This exercise can place a lot of stress on your foot and ankle. Please complete this exercise only if specifically instructed by your caregiver.   Place the ball of your right / left foot on a step, keeping your other foot firmly on the same step.  Hold on to the wall or a rail for balance.  Slowly lift your other foot, allowing your body weight to press your heel down over the edge of the step.  You should feel a stretch in your right / left calf.  Hold this   position for 10 seconds.  Repeat this exercise with a slight bend in your right / left knee. Repeat 3 times. Complete this stretch 2 times per day.   STRENGTHENING EXERCISES - Plantar Fasciitis (Heel Spur Syndrome)  These exercises may help you when beginning to rehabilitate your injury. They may resolve your symptoms with or without further involvement from your physician, physical therapist or athletic trainer. While completing these exercises, remember:   Muscles can gain both the endurance and the strength needed for everyday activities through controlled exercises.  Complete these exercises as instructed by your physician, physical therapist or athletic trainer. Progress the resistance and repetitions only as guided.  STRENGTH -  Towel Curls  Sit in a chair positioned on a non-carpeted surface.  Place your foot on a towel, keeping your heel on the floor.  Pull the towel toward your heel by only curling your toes. Keep your heel on the floor. Repeat 3 times. Complete this exercise 2 times per day.  STRENGTH - Ankle Inversion  Secure one end of a rubber exercise band/tubing to a fixed object (table, pole). Loop the other end around your foot just before your toes.  Place your fists between your knees. This will focus your strengthening at your ankle.  Slowly, pull your big toe up and in, making sure the band/tubing is positioned to resist the entire motion.  Hold this position for 10 seconds.  Have your muscles resist the band/tubing as it slowly pulls your foot back to the starting position. Repeat 3 times. Complete this exercises 2 times per day.  Document Released: 09/18/2005 Document Revised: 12/11/2011 Document Reviewed: 12/31/2008 [employer] Patient Information 2014 [employer]. 

## 2021-08-11 NOTE — Progress Notes (Signed)
Subjective:   Patient ID: Jody Stokes, female   DOB: 60 y.o.   MRN: [id number]   HPI 60 year old female presents the office today for concerns of reoccurrence of pain to the bottom of both of her heels.  She states is not as bad as it had been previously but she does not want to get out of control.  Right side worse than left.  She states that she gets that comes and goes.  No recent gout that she reports.  She said no recent treatment since I last saw her.  Otherwise she is doing well without any other concerns.   Review of Systems  All other systems reviewed and are negative.  Past Medical History:  Diagnosis Date   Anemia    B12 deficiency 03/14/2016   Congenital heart disease    Depression    Fibromyalgia    Gastric bypass status for obesity 03/14/2016   Iron deficiency anemia 03/14/2016   Presence of IVC filter    Pulmonary embolism (HCC)    Rheumatoid arthritis (HCC)    Rheumatoid   Sleep apnea    uses CPAP   Tubular adenoma of colon     Past Surgical History:  Procedure Laterality Date   BIOPSY N/A 11/19/2014   Procedure: BIOPSY;  Surgeon: Daneil Dolin, MD;  Location: [facility];  Service: Endoscopy;  Laterality: N/A;  Ascending Colon   BIOPSY  09/22/2016   Procedure: BIOPSY;  Surgeon: Rogene Houston, MD;  [facility];  Service: Endoscopy;;  small bowel   BIOPSY  03/08/2018   Procedure: BIOPSY;  Surgeon: Rogene Houston, MD;  [facility]: [facility];  Service: Endoscopy;;  colon   CARPAL TUNNEL RELEASE Bilateral    CHOLECYSTECTOMY  2009   COLONOSCOPY  07/2012   transverse colon 5 mm polyp removed   COLONOSCOPY WITH PROPOFOL N/A 11/19/2014   RMR: mutilple colonic polyps removed as described above. Pancolonic diverticulosis(few).  Status post mucosal biopsy to assess for microscopic coliits. /    COLONOSCOPY WITH PROPOFOL N/A 03/08/2018   Procedure: COLONOSCOPY WITH PROPOFOL;  Surgeon: Rogene Houston, MD;  [facility];  Service: Endoscopy;   Laterality: N/A;   ESOPHAGOGASTRODUODENOSCOPY N/A 09/22/2016   Procedure: ESOPHAGOGASTRODUODENOSCOPY (EGD);  Surgeon: Rogene Houston, MD;  [facility];  Service: Endoscopy;  Laterality: N/A;  1:15   ESOPHAGOGASTRODUODENOSCOPY (EGD) WITH PROPOFOL N/A 09/05/2019   Procedure: ESOPHAGOGASTRODUODENOSCOPY (EGD) WITH PROPOFOL;  Surgeon: Rogene Houston, MD;  [facility];  Service: Endoscopy;  Laterality: N/A;  1005am   FOOT SURGERY  2015   left and right   GASTRIC BYPASS  2008   HERNIA REPAIR     HIATAL HERNIA REPAIR  2007   POLYPECTOMY N/A 11/19/2014   Procedure: POLYPECTOMY;  Surgeon: Daneil Dolin, MD;  Location: [facility];  Service: Endoscopy;  Laterality: N/A;  ileocecal valve, Hepatic Flexure, Splenic Flexure, Descending Colon    TUBAL LIGATION       Current Outpatient Medications:    cholestyramine light (PREVALITE) 4 g packet, Take 1 packet (4 g total) by mouth 2 (two) times daily., Disp: 60 each, Rfl: 0   cyclobenzaprine (FLEXERIL) 10 MG tablet, Take 10 mg by mouth at bedtime as needed., Disp: , Rfl:    diphenhydramine-acetaminophen (TYLENOL PM) 25-500 MG TABS tablet, Take 2 tablets by mouth at bedtime. , Disp: , Rfl:    DULoxetine (CYMBALTA) 60 MG capsule, Take 60 mg by mouth at bedtime. ,  Disp: , Rfl: 0   golimumab (SIMPONI ARIA) 50 MG/4ML SOLN injection, Inject 50 mg into the vein every 8 (eight) weeks. , Disp: , Rfl:    modafinil (PROVIGIL) 200 MG tablet, Take 200 mg by mouth 2 (two) times daily., Disp: , Rfl:    montelukast (SINGULAIR) 10 MG tablet, TAKE 1 TABLET(10 MG) BY MOUTH AT BEDTIME, Disp: 30 tablet, Rfl: 6   omeprazole (PRILOSEC) 20 MG capsule, Take 20 mg by mouth 2 (two) times daily before a meal. , Disp: , Rfl:    pregabalin (LYRICA) 75 MG capsule, Take 75 mg by mouth 2 (two) times daily., Disp: , Rfl:   Allergies  Allergen Reactions   Viberzi [Eluxadoline] Other (See Comments)    Patient states that she has extreme abdominal cramping.          Objective:  Physical Exam  General: AAO x3, NAD  Dermatological: Skin is warm, dry and supple bilateral. There are no open sores, no preulcerative lesions, no rash or signs of infection present.  Vascular: Dorsalis Pedis artery and Posterior Tibial artery pedal pulses are 2/4 bilateral with immedate capillary fill time.  There is no pain with calf compression, swelling, warmth, erythema.   Neruologic: Grossly intact via light touch bilateral.  No Tinel sign.  Musculoskeletal: Tenderness to palpation along the plantar medial tubercle of the calcaneus at the insertion of plantar fascia on the right and left foot. There is no pain along the course of the plantar fascia within the arch of the foot. Plantar fascia appears to be intact. There is no pain with lateral compression of the calcaneus or pain with vibratory sensation. There is no pain along the course or insertion of the achilles tendon. No other areas of tenderness to bilateral lower extremities.Muscular strength 5/5 in all groups tested bilateral.  Gait: Unassisted, Nonantalgic.       Assessment:   Bilateral heel pain, Plantar fasciitis     Plan:  -Treatment options discussed including all alternatives, risks, and complications -Etiology of symptoms were discussed -X-rays were obtained and reviewed with the patient.  No subacute fracture.  Calcaneal spurring present. -Patient was to proceed with steroid injections which were done today.  See procedure note below.  Discussed traction, icing daily.  Continue with shoes, good arch support.  Plantar fascial braces were dispensed as well.  Procedure: Injection Tendon/Ligament Discussed alternatives, risks, complications and verbal consent was obtained.  Location: Bilateral plantar fascia at the glabrous junction; medial approach. Skin Prep: Alcohol. Injectate: 0.5cc 0.5% marcaine plain, 0.5 cc 2% lidocaine plain and, 1 cc kenalog 10. Disposition: Patient tolerated procedure well.  Injection site dressed with a band-aid.  Post-injection care was discussed and return precautions discussed.   Return in about 6 weeks (around 09/19/2021), or if symptoms worsen or fail to improve, for plantar fascitiis .  Trula Slade DPM

## 2021-09-09 ENCOUNTER — Ambulatory Visit
Admission: RE | Admit: 2021-09-09 | Discharge: 2021-09-09 | Disposition: A | Discharge status: Home / Self Care | Payer: BC Managed Care – PPO | Source: Ambulatory Visit | Attending: Endocrinology | Admitting: Endocrinology

## 2021-09-09 DIAGNOSIS — Z1231 Encounter for screening mammogram for malignant neoplasm of breast: Secondary | ICD-10-CM

## 2021-09-23 ENCOUNTER — Ambulatory Visit: Payer: BC Managed Care – PPO | Admitting: Obstetrics and Gynecology

## 2021-10-07 ENCOUNTER — Other Ambulatory Visit: Payer: Self-pay

## 2021-10-07 ENCOUNTER — Encounter: Payer: Self-pay | Admitting: Obstetrics and Gynecology

## 2021-10-07 ENCOUNTER — Ambulatory Visit: Payer: BC Managed Care – PPO | Admitting: Obstetrics and Gynecology

## 2021-10-07 ENCOUNTER — Other Ambulatory Visit (HOSPITAL_COMMUNITY)
Admission: RE | Admit: 2021-10-07 | Discharge: 2021-10-07 | Disposition: A | Discharge status: Home / Self Care | Payer: BC Managed Care – PPO | Source: Ambulatory Visit | Attending: Obstetrics and Gynecology | Admitting: Obstetrics and Gynecology

## 2021-10-07 VITALS — BP 136/80 | HR 81 | Ht 66.5 in | Wt 255.0 lb

## 2021-10-07 DIAGNOSIS — R9389 Abnormal findings on diagnostic imaging of other specified body structures: Secondary | ICD-10-CM | POA: Diagnosis not present

## 2021-10-07 DIAGNOSIS — N950 Postmenopausal bleeding: Secondary | ICD-10-CM | POA: Diagnosis not present

## 2021-10-07 NOTE — Progress Notes (Signed)
GYNECOLOGY  VISIT   HPI: 61 y.o.   Married Other or two or more races Not Hispanic or Latino  female   G3P3 with Patient's last menstrual period was 03/01/2017.   here for history of postmenopausal bleeding 2020. Patient had thickening of endometrium and was told she should have D & C but never followed up.   Records in care everywhere from [facility], gyn notes reviewed: [place] on 08/08/19 that revealed an enlarged uterus measuring 12.4 x 7 x 8.2cm with focal area measuring 3.9 x 4.2 x 4.7 cm that suggests uterine fibroid vs adenomyoma. She was noted to have a thickened lining of 13 mm . Normal bilateral ovaries.  Endometrial biopsy was benign. FRAGMENTS OF ENDOMETRIUM WITH GLANDULAR AND STROMAL BREAKDOWN. NO HYPERPLASIA OR       MALIGNANCY IDENTIFIED.   Pap from 08/18/19 negative (no hpv testing).  Since she was seen in 11/20 she has had a couple of episodes of spotting, the last was in 5/22. She reports frequent mild pelvic cramping.   She has had occasional spotting.  GYNECOLOGIC HISTORY: Patient's last menstrual period was 03/01/2017. Contraception:Tubal Menopausal hormone therapy: none        OB History     Gravida  3   Para  3   Term      Preterm      AB      Living  3      SAB      IAB      Ectopic      Multiple      Live Births                 Patient Active Problem List   Diagnosis Date Noted   Absolute anemia 07/16/2019   Hx of colonic polyps 02/05/2018   Tarsal tunnel syndrome 01/31/2017   Abdominal pain, epigastric 08/28/2016   B12 deficiency 03/14/2016   Iron deficiency anemia 03/14/2016   Gastric bypass status for obesity 03/14/2016   Plantar fasciitis of right foot 10/04/2015   Plantar fasciitis of left foot 09/15/2015   Plantar fasciitis 08/12/2015   Heel spur 08/12/2015   Fatigue 03/23/2015   History of colonic polyps    Diverticulosis of colon without hemorrhage    History of adenomatous polyp of colon 11/09/2014   Diarrhea 11/09/2014    Abdominal pain 11/09/2014    Past Medical History:  Diagnosis Date   Anemia    B12 deficiency 03/14/2016   Congenital heart disease    Depression    Fibromyalgia    Gastric bypass status for obesity 03/14/2016   Iron deficiency anemia 03/14/2016   Presence of IVC filter    Pulmonary embolism (HCC)    Rheumatoid arthritis (HCC)    Rheumatoid   Sleep apnea    uses CPAP   Tubular adenoma of colon   PE was post op s/p hernia surgery.   Past Surgical History:  Procedure Laterality Date   BIOPSY N/A 11/19/2014   Procedure: BIOPSY;  Surgeon: Daneil Dolin, MD;  Location: [facility];  Service: Endoscopy;  Laterality: N/A;  Ascending Colon   BIOPSY  09/22/2016   Procedure: BIOPSY;  Surgeon: Rogene Houston, MD;  [facility];  Service: Endoscopy;;  small bowel   BIOPSY  03/08/2018   Procedure: BIOPSY;  Surgeon: Rogene Houston, MD;  [facility];  Service: Endoscopy;;  colon   CARPAL TUNNEL RELEASE Bilateral    CHOLECYSTECTOMY  2009   COLONOSCOPY  07/2012   transverse colon 5 mm polyp removed   COLONOSCOPY WITH PROPOFOL N/A 11/19/2014   RMR: mutilple colonic polyps removed as described above. Pancolonic diverticulosis(few).  Status post mucosal biopsy to assess for microscopic coliits. /    COLONOSCOPY WITH PROPOFOL N/A 03/08/2018   Procedure: COLONOSCOPY WITH PROPOFOL;  Surgeon: Rogene Houston, MD;  [facility];  Service: Endoscopy;  Laterality: N/A;   ESOPHAGOGASTRODUODENOSCOPY N/A 09/22/2016   Procedure: ESOPHAGOGASTRODUODENOSCOPY (EGD);  Surgeon: Rogene Houston, MD;  [facility];  Service: Endoscopy;  Laterality: N/A;  1:15   ESOPHAGOGASTRODUODENOSCOPY (EGD) WITH PROPOFOL N/A 09/05/2019   Procedure: ESOPHAGOGASTRODUODENOSCOPY (EGD) WITH PROPOFOL;  Surgeon: Rogene Houston, MD;  [facility];  Service: Endoscopy;  Laterality: N/A;  1005am   FOOT SURGERY  2015   left and right   GASTRIC BYPASS  2008   HERNIA REPAIR     HIATAL  HERNIA REPAIR  2007   POLYPECTOMY N/A 11/19/2014   Procedure: POLYPECTOMY;  Surgeon: Daneil Dolin, MD;  Location: [facility];  Service: Endoscopy;  Laterality: N/A;  ileocecal valve, Hepatic Flexure, Splenic Flexure, Descending Colon    TUBAL LIGATION      Current Outpatient Medications  Medication Sig Dispense Refill   acetaminophen (TYLENOL) 650 MG CR tablet 2 tablets as needed     aspirin-acetaminophen-caffeine (EXCEDRIN MIGRAINE) 250-250-65 MG tablet 2 tablets as needed     cyclobenzaprine (FLEXERIL) 10 MG tablet Take 10 mg by mouth at bedtime as needed.     DULoxetine (CYMBALTA) 60 MG capsule 1 capsule     LORazepam (ATIVAN) 1 MG tablet Take by mouth.     meclizine (ANTIVERT) 12.5 MG tablet Take 12.5 mg by mouth every 4 (four) hours as needed.     modafinil (PROVIGIL) 200 MG tablet Take 200 mg by mouth 2 (two) times daily.     omeprazole (PRILOSEC) 40 MG capsule Take 40 mg by mouth daily.     phentermine (ADIPEX-P) 37.5 MG tablet Take 37.5 mg by mouth daily.     pregabalin (LYRICA) 75 MG capsule Take 75 mg by mouth 2 (two) times daily.     topiramate (TOPAMAX) 50 MG tablet Take 50 mg by mouth 2 (two) times daily.     traMADol (ULTRAM) 50 MG tablet See admin instructions.     No current facility-administered medications for this visit.     ALLERGIES: Viberzi [eluxadoline]  Family History  Problem Relation Age of Onset   HIV Sister    Hypertension Brother    Diabetes Brother    Kidney disease Brother    Hypertension Brother    Heart Problems Brother        No details.    Hypertension Brother    Colon cancer Neg Hx    Breast cancer Neg Hx     Social History   Socioeconomic History   Marital status: Married    Spouse name: Not on file   Number of children: 3   Years of education: Not on file   Highest education level: Not on file  Occupational History   Not on file  Tobacco Use   Smoking status: Never   Smokeless tobacco: Never  Vaping Use   Vaping Use: Never used   Substance and Sexual Activity   Alcohol use: No    Alcohol/week: 0.0 standard drinks   Drug use: No   Sexual activity: Yes    Birth control/protection: Surgical    Comment: tubal  Other Topics Concern   Not on file  Social History Narrative   Lives with husband.     Social Determinants of Health   Financial Resource Strain: Not on file  Food Insecurity: Not on file  Transportation Needs: Not on file  Physical Activity: Not on file  Stress: Not on file  Social Connections: Not on file  Intimate Partner Violence: Not on file    Review of Systems  Constitutional: Negative.   HENT: Negative.    Eyes: Negative.   Respiratory: Negative.    Cardiovascular: Negative.   Gastrointestinal: Negative.   Genitourinary: Negative.   Musculoskeletal: Negative.   Skin: Negative.   Neurological: Negative.   Endo/Heme/Allergies: Negative.   Psychiatric/Behavioral: Negative.    All other systems reviewed and are negative.  PHYSICAL EXAMINATION:    BP 136/80    Pulse 81    Ht 5' 6.5" (1.689 m)    Wt 255 lb (115.7 kg)    LMP 03/01/2017    SpO2 98%    BMI 40.54 kg/m     General appearance: alert, cooperative and appears stated age  Pelvic: External genitalia:  no lesions              Urethra:  normal appearing urethra with no masses, tenderness or lesions              Bartholins and Skenes: normal                 Vagina: normal appearing vagina with normal color and discharge, no lesions              Cervix: no lesions              Bimanual Exam:  Uterus:   anteverted, mobile, not appreciably enlarged, not tender              Adnexa: no mass, fullness, tenderness                The risks of endometrial biopsy were reviewed and a consent was obtained.  A speculum was placed in the vagina and the cervix was cleansed with Hibiclens. The pipelle was placed into the endometrial cavity. The uterus sounded to 8 cm. The endometrial biopsy was performed, taking care to get a representative  sample, sampling 360 degrees of the uterine cavity. Minimal tissue was obtained. The speculum was removed. There were no complications.   Chaperone, Marisa Sprinkles, was present for exam.  1. Postmenopausal bleeding 2 years ago she had a benign endometrial biopsy and a thickened endometrial stripe of 13 mm. Didn't have f/u. We discussed PMP bleeding and the need for evaluation. Endometrial biopsy today with minimal tissue, I am suspicious of an endometrial polyp - Surgical pathology( [place]/ [facility]) - [place] Sonohysterogram; Future -We discussed the likely need for hysteroscopy, D&C, information given  2. Thickened endometrium - Surgical pathology( [place]/ [facility]) - [place] Sonohysterogram; Future

## 2021-10-07 NOTE — Patient Instructions (Signed)

## 2021-10-11 LAB — SURGICAL PATHOLOGY

## 2021-10-18 ENCOUNTER — Ambulatory Visit: Payer: BC Managed Care – PPO | Admitting: Obstetrics and Gynecology

## 2021-10-18 ENCOUNTER — Ambulatory Visit (INDEPENDENT_AMBULATORY_CARE_PROVIDER_SITE_OTHER): Payer: BC Managed Care – PPO

## 2021-10-18 ENCOUNTER — Other Ambulatory Visit: Payer: Self-pay

## 2021-10-18 DIAGNOSIS — R9389 Abnormal findings on diagnostic imaging of other specified body structures: Secondary | ICD-10-CM | POA: Diagnosis not present

## 2021-10-18 DIAGNOSIS — N950 Postmenopausal bleeding: Secondary | ICD-10-CM | POA: Diagnosis not present

## 2021-10-18 DIAGNOSIS — D259 Leiomyoma of uterus, unspecified: Secondary | ICD-10-CM

## 2021-10-18 NOTE — Progress Notes (Signed)
GYNECOLOGY  VISIT   HPI: 61 y.o.   Married Other or two or more races Not Hispanic or Latino  female   G3P3 with Patient's last menstrual period was 03/01/2017.   here for further evaluation of PMP bleeding.  Endometrial biopsy was benign, but minimal specimen (intact glandular-stromal endometrial tissue was not present).    She was previously evaluated elsewhere in 2020 with PMP bleeding. She was noted to have a thickened endometrium and a hysteroscopy, D&C was recommended. She never had it. She has had a couple of episodes of spotting since then and has frequent mid pelvic cramping.   GYNECOLOGIC HISTORY: Patient's last menstrual period was 03/01/2017. Contraception:PMP Menopausal hormone therapy: no        OB History     Gravida  3   Para  3   Term      Preterm      AB      Living  3      SAB      IAB      Ectopic      Multiple      Live Births                 Patient Active Problem List   Diagnosis Date Noted   Absolute anemia 07/16/2019   Hx of colonic polyps 02/05/2018   Tarsal tunnel syndrome 01/31/2017   Abdominal pain, epigastric 08/28/2016   B12 deficiency 03/14/2016   Iron deficiency anemia 03/14/2016   Gastric bypass status for obesity 03/14/2016   Plantar fasciitis of right foot 10/04/2015   Plantar fasciitis of left foot 09/15/2015   Plantar fasciitis 08/12/2015   Heel spur 08/12/2015   Fatigue 03/23/2015   History of colonic polyps    Diverticulosis of colon without hemorrhage    History of adenomatous polyp of colon 11/09/2014   Diarrhea 11/09/2014   Abdominal pain 11/09/2014    Past Medical History:  Diagnosis Date   Anemia    B12 deficiency 03/14/2016   Congenital heart disease    Depression    Fibromyalgia    Gastric bypass status for obesity 03/14/2016   Iron deficiency anemia 03/14/2016   Presence of IVC filter    Pulmonary embolism (HCC)    Rheumatoid arthritis (HCC)    Rheumatoid   Sleep apnea    uses CPAP   Tubular  adenoma of colon     Past Surgical History:  Procedure Laterality Date   BIOPSY N/A 11/19/2014   Procedure: BIOPSY;  Surgeon: Daneil Dolin, MD;  Location: [facility];  Service: Endoscopy;  Laterality: N/A;  Ascending Colon   BIOPSY  09/22/2016   Procedure: BIOPSY;  Surgeon: Rogene Houston, MD;  [facility];  Service: Endoscopy;;  small bowel   BIOPSY  03/08/2018   Procedure: BIOPSY;  Surgeon: Rogene Houston, MD;  [facility];  Service: Endoscopy;;  colon   CARPAL TUNNEL RELEASE Bilateral    CHOLECYSTECTOMY  2009   COLONOSCOPY  07/2012   transverse colon 5 mm polyp removed   COLONOSCOPY WITH PROPOFOL N/A 11/19/2014   RMR: mutilple colonic polyps removed as described above. Pancolonic diverticulosis(few).  Status post mucosal biopsy to assess for microscopic coliits. /    COLONOSCOPY WITH PROPOFOL N/A 03/08/2018   Procedure: COLONOSCOPY WITH PROPOFOL;  Surgeon: Rogene Houston, MD;  [facility];  Service: Endoscopy;  Laterality: N/A;   ESOPHAGOGASTRODUODENOSCOPY N/A 09/22/2016   Procedure: ESOPHAGOGASTRODUODENOSCOPY (EGD);  Surgeon: Mechele Dawley  Laural Golden, MD;  [facility];  Service: Endoscopy;  Laterality: N/A;  1:15   ESOPHAGOGASTRODUODENOSCOPY (EGD) WITH PROPOFOL N/A 09/05/2019   Procedure: ESOPHAGOGASTRODUODENOSCOPY (EGD) WITH PROPOFOL;  Surgeon: Rogene Houston, MD;  [facility];  Service: Endoscopy;  Laterality: N/A;  1005am   FOOT SURGERY  2015   left and right   GASTRIC BYPASS  2008   HERNIA REPAIR     HIATAL HERNIA REPAIR  2007   POLYPECTOMY N/A 11/19/2014   Procedure: POLYPECTOMY;  Surgeon: Daneil Dolin, MD;  Location: [facility];  Service: Endoscopy;  Laterality: N/A;  ileocecal valve, Hepatic Flexure, Splenic Flexure, Descending Colon    TUBAL LIGATION      Current Outpatient Medications  Medication Sig Dispense Refill   acetaminophen (TYLENOL) 650 MG CR tablet 2 tablets as needed     aspirin-acetaminophen-caffeine (EXCEDRIN  MIGRAINE) 250-250-65 MG tablet 2 tablets as needed     cyclobenzaprine (FLEXERIL) 10 MG tablet Take 10 mg by mouth at bedtime as needed.     DULoxetine (CYMBALTA) 60 MG capsule 1 capsule     LORazepam (ATIVAN) 1 MG tablet Take by mouth.     meclizine (ANTIVERT) 12.5 MG tablet Take 12.5 mg by mouth every 4 (four) hours as needed.     modafinil (PROVIGIL) 200 MG tablet Take 200 mg by mouth 2 (two) times daily.     omeprazole (PRILOSEC) 40 MG capsule Take 40 mg by mouth daily.     phentermine (ADIPEX-P) 37.5 MG tablet Take 37.5 mg by mouth daily.     pregabalin (LYRICA) 75 MG capsule Take 75 mg by mouth 2 (two) times daily.     topiramate (TOPAMAX) 50 MG tablet Take 50 mg by mouth 2 (two) times daily.     traMADol (ULTRAM) 50 MG tablet See admin instructions.     No current facility-administered medications for this visit.     ALLERGIES: Viberzi [eluxadoline]  Family History  Problem Relation Age of Onset   HIV Sister    Hypertension Brother    Diabetes Brother    Kidney disease Brother    Hypertension Brother    Heart Problems Brother        No details.    Hypertension Brother    Colon cancer Neg Hx    Breast cancer Neg Hx     Social History   Socioeconomic History   Marital status: Married    Spouse name: Not on file   Number of children: 3   Years of education: Not on file   Highest education level: Not on file  Occupational History   Not on file  Tobacco Use   Smoking status: Never   Smokeless tobacco: Never  Vaping Use   Vaping Use: Never used  Substance and Sexual Activity   Alcohol use: No    Alcohol/week: 0.0 standard drinks   Drug use: No   Sexual activity: Yes    Birth control/protection: Surgical    Comment: tubal  Other Topics Concern   Not on file  Social History Narrative   Lives with husband.     Social Determinants of Health   Financial Resource Strain: Not on file  Food Insecurity: Not on file  Transportation Needs: Not on file  Physical  Activity: Not on file  Stress: Not on file  Social Connections: Not on file  Intimate Partner Violence: Not on file    ROS  PHYSICAL EXAMINATION:    LMP 03/01/2017  General appearance: alert, cooperative and appears stated age Pelvic ultrasound  Indications: PMP bleeding  Findings:  Uterus 8.17 x 4.93 x 3.41 cm, anteverted Fibroids: 1) 2.16 x 1.94  cm, posterior/intramural/subserosal 2) 1.62 x 1.56 cm, posterior/intramural 3) 1.5 x 1.16 cm, anterior/intramural/submucosal  Endometrium 3.41 cm, partially distorted by central myoma  Left ovary 2.59 x 1.38 x 1.54 cm  Right ovary 2.85 x 1.09 x 0.99 cm  No free fluid  Sonohysterogram The procedure and risks of the procedure were reviewed with the patient, consent form was signed. A speculum was placed in the vagina and the cervix was cleansed with betadine. The sonohysterogram catheter was inserted into the uterine cavity without difficulty. Saline was infused under direct observation with the ultrasound. She has a grade 2 myoma entering the cavity anteriorly. The endometrium is uniformly thin, total of 3.4 mm in thickness.The catheter was removed.   Impression:  Normal sized anteverted uterus 3 small myomas Sonohysterogram reveals a grade 2 submucosal myoma Uniform, thin endometrium Normal ovaries bilaterally.   1. Postmenopausal bleeding -Thin endometrium, recent biopsy with superficial strips of benign endometrium, unassessable/marginal specimen.  -Given thin lining no further evaluation at this time. She will call with further bleeding  2. Uterine leiomyoma, unspecified location Grade 2 myoma, PMP, no need for removal.

## 2021-10-20 ENCOUNTER — Encounter: Payer: Self-pay | Admitting: Obstetrics and Gynecology

## 2021-11-03 ENCOUNTER — Telehealth: Payer: Self-pay

## 2021-11-03 NOTE — Telephone Encounter (Signed)
NOTES SCANNED TO REFERRAL 

## 2021-11-09 ENCOUNTER — Other Ambulatory Visit (HOSPITAL_COMMUNITY): Payer: Self-pay

## 2021-11-09 DIAGNOSIS — D508 Other iron deficiency anemias: Secondary | ICD-10-CM

## 2021-11-09 DIAGNOSIS — E538 Deficiency of other specified B group vitamins: Secondary | ICD-10-CM

## 2021-11-10 ENCOUNTER — Inpatient Hospital Stay (HOSPITAL_COMMUNITY): Payer: BC Managed Care – PPO | Attending: Hematology

## 2021-11-10 DIAGNOSIS — D509 Iron deficiency anemia, unspecified: Secondary | ICD-10-CM | POA: Insufficient documentation

## 2021-11-10 DIAGNOSIS — E538 Deficiency of other specified B group vitamins: Secondary | ICD-10-CM | POA: Diagnosis not present

## 2021-11-10 DIAGNOSIS — G4730 Sleep apnea, unspecified: Secondary | ICD-10-CM | POA: Diagnosis not present

## 2021-11-10 DIAGNOSIS — Z86711 Personal history of pulmonary embolism: Secondary | ICD-10-CM | POA: Diagnosis not present

## 2021-11-10 DIAGNOSIS — R5383 Other fatigue: Secondary | ICD-10-CM | POA: Diagnosis not present

## 2021-11-10 DIAGNOSIS — E559 Vitamin D deficiency, unspecified: Secondary | ICD-10-CM | POA: Insufficient documentation

## 2021-11-10 DIAGNOSIS — M069 Rheumatoid arthritis, unspecified: Secondary | ICD-10-CM | POA: Insufficient documentation

## 2021-11-10 DIAGNOSIS — M797 Fibromyalgia: Secondary | ICD-10-CM | POA: Insufficient documentation

## 2021-11-10 DIAGNOSIS — F32A Depression, unspecified: Secondary | ICD-10-CM | POA: Insufficient documentation

## 2021-11-10 DIAGNOSIS — G479 Sleep disorder, unspecified: Secondary | ICD-10-CM | POA: Diagnosis not present

## 2021-11-10 DIAGNOSIS — Q249 Congenital malformation of heart, unspecified: Secondary | ICD-10-CM | POA: Insufficient documentation

## 2021-11-10 DIAGNOSIS — D508 Other iron deficiency anemias: Secondary | ICD-10-CM

## 2021-11-10 DIAGNOSIS — Z9884 Bariatric surgery status: Secondary | ICD-10-CM | POA: Diagnosis not present

## 2021-11-10 LAB — FERRITIN: Ferritin: 43 ng/mL (ref 11–307)

## 2021-11-10 LAB — COMPREHENSIVE METABOLIC PANEL
ALT: 49 U/L — ABNORMAL HIGH (ref 0–44)
AST: 40 U/L (ref 15–41)
Albumin: 3.9 g/dL (ref 3.5–5.0)
Alkaline Phosphatase: 91 U/L (ref 38–126)
Anion gap: 3 — ABNORMAL LOW (ref 5–15)
BUN: 19 mg/dL (ref 6–20)
CO2: 23 mmol/L (ref 22–32)
Calcium: 8.4 mg/dL — ABNORMAL LOW (ref 8.9–10.3)
Chloride: 106 mmol/L (ref 98–111)
Creatinine, Ser: 0.88 mg/dL (ref 0.44–1.00)
GFR, Estimated: >60 mL/min (ref >60)
Glucose, Bld: 68 mg/dL — ABNORMAL LOW (ref 70–99)
Potassium: 4 mmol/L (ref 3.5–5.1)
Sodium: 132 mmol/L — ABNORMAL LOW (ref 135–145)
Total Bilirubin: 0.3 mg/dL (ref 0.3–1.2)
Total Protein: 7.2 g/dL (ref 6.5–8.1)

## 2021-11-10 LAB — FOLATE: Folate: 8.2 ng/mL (ref >5.9)

## 2021-11-10 LAB — CBC WITH DIFFERENTIAL/PLATELET
Abs Immature Granulocytes: 0.12 10*3/uL — ABNORMAL HIGH (ref 0.00–0.07)
Basophils Absolute: 0.1 10*3/uL (ref 0.0–0.1)
Basophils Relative: 1 %
Eosinophils Absolute: 0.1 10*3/uL (ref 0.0–0.5)
Eosinophils Relative: 1 %
HCT: 46 % (ref 36.0–46.0)
Hemoglobin: 14.4 g/dL (ref 12.0–15.0)
Immature Granulocytes: 1 %
Lymphocytes Relative: 21 %
Lymphs Abs: 2 10*3/uL (ref 0.7–4.0)
MCH: 30.9 pg (ref 26.0–34.0)
MCHC: 31.3 g/dL (ref 30.0–36.0)
MCV: 98.7 fL (ref 80.0–100.0)
Monocytes Absolute: 0.8 10*3/uL (ref 0.1–1.0)
Monocytes Relative: 8 %
Neutro Abs: 6.3 10*3/uL (ref 1.7–7.7)
Neutrophils Relative %: 68 %
Platelets: 232 10*3/uL (ref 150–400)
RBC: 4.66 MIL/uL (ref 3.87–5.11)
RDW: 13.9 % (ref 11.5–15.5)
WBC: 9.3 10*3/uL (ref 4.0–10.5)
nRBC: 0 % (ref 0.0–0.2)

## 2021-11-10 LAB — VITAMIN B12: Vitamin B-12: 100 pg/mL — ABNORMAL LOW (ref 180–914)

## 2021-11-10 LAB — IRON AND TIBC
Iron: 64 ug/dL (ref 28–170)
Saturation Ratios: 17 % (ref 10.4–31.8)
TIBC: 373 ug/dL (ref 250–450)
UIBC: 309 ug/dL

## 2021-11-10 LAB — LACTATE DEHYDROGENASE: LDH: 215 U/L — ABNORMAL HIGH (ref 98–192)

## 2021-11-10 LAB — VITAMIN D 25 HYDROXY (VIT D DEFICIENCY, FRACTURES): Vit D, 25-Hydroxy: 14.21 ng/mL — ABNORMAL LOW (ref 30–100)

## 2021-11-15 NOTE — Progress Notes (Signed)
[address], [place] [address]   CLINIC:  Medical Oncology/Hematology  PCP:  Denny Levy, [place] [address] [phone]   REASON FOR VISIT:  Follow-up for iron deficiency anemia and B12 deficiency  PRIOR THERAPY: Intermittent IV iron infusions, monthly B12 injections  CURRENT THERAPY: None (lost to follow-up since 12/04/2019)  INTERVAL HISTORY:  Jody Stokes 61 y.o. female returns for routine follow-up of iron deficiency anemia and B12 deficiency.  She was last seen by NP Francene Finders on 10/31/2019.  Her last IV iron infusion was on 12/04/2019.  At today's visit, she reports feeling fair.  No recent hospitalizations, surgeries, or changes in baseline health status.  Patient's iron deficiency and B12 deficiency are thought to be from her gastric bypass surgery.  She denies any bright red blood per rectum, melena, or epistaxis.  However, she is symptomatic with extreme fatigue for the past month, as well as headaches, palpitations, dyspnea on exertion, and lightheadedness.  She denies any pica, restless legs, chest pain, or syncope.  She reports that iron infusions of helped with her symptoms in the past.  Since she was lost to follow-up in 2021, she has not been on any iron supplementation, B12 injections, vitamin D, or other supplements.  She also has lost to follow-up with her rheumatologist and reports that her rheumatoid arthritis has been very troublesome lately and that she is "just tired of feeling so sore and tired all the time."  She has little to no energy and 50% appetite. She endorses that she is maintaining a stable weight.   REVIEW OF SYSTEMS:  Review of Systems  Constitutional:  Positive for fatigue. Negative for appetite change, chills, diaphoresis, fever and unexpected weight change.  HENT:   Negative for lump/mass and nosebleeds.   Eyes:  Negative for eye problems.  Respiratory:  Positive for shortness of breath  (with exertion). Negative for cough and hemoptysis.   Cardiovascular:  Positive for palpitations. Negative for chest pain and leg swelling.  Gastrointestinal:  Positive for nausea. Negative for abdominal pain, blood in stool, constipation, diarrhea and vomiting.  Genitourinary:  Negative for hematuria.   Musculoskeletal:  Positive for arthralgias.  Skin: Negative.   Neurological:  Positive for dizziness, headaches, light-headedness and numbness.  Hematological:  Does not bruise/bleed easily.  Psychiatric/Behavioral:  Positive for depression and sleep disturbance.      PAST MEDICAL/SURGICAL HISTORY:  Past Medical History:  Diagnosis Date   Anemia    B12 deficiency 03/14/2016   Congenital heart disease    Depression    Fibromyalgia    Gastric bypass status for obesity 03/14/2016   Iron deficiency anemia 03/14/2016   Presence of IVC filter    Pulmonary embolism (HCC)    Rheumatoid arthritis (HCC)    Rheumatoid   Sleep apnea    uses CPAP   Tubular adenoma of colon    Past Surgical History:  Procedure Laterality Date   BIOPSY N/A 11/19/2014   Procedure: BIOPSY;  Surgeon: Daneil Dolin, MD;  Location: [facility];  Service: Endoscopy;  Laterality: N/A;  Ascending Colon   BIOPSY  09/22/2016   Procedure: BIOPSY;  Surgeon: Rogene Houston, MD;  [facility];  Service: Endoscopy;;  small bowel   BIOPSY  03/08/2018   Procedure: BIOPSY;  Surgeon: Rogene Houston, MD;  [facility];  Service: Endoscopy;;  colon   CARPAL TUNNEL RELEASE Bilateral    CHOLECYSTECTOMY  2009   COLONOSCOPY  07/2012   transverse colon 5 mm polyp removed   COLONOSCOPY WITH PROPOFOL N/A 11/19/2014   RMR: mutilple colonic polyps removed as described above. Pancolonic diverticulosis(few).  Status post mucosal biopsy to assess for microscopic coliits. /    COLONOSCOPY WITH PROPOFOL N/A 03/08/2018   Procedure: COLONOSCOPY WITH PROPOFOL;  Surgeon: Rogene Houston, MD;  [facility];  Service:  Endoscopy;  Laterality: N/A;   ESOPHAGOGASTRODUODENOSCOPY N/A 09/22/2016   Procedure: ESOPHAGOGASTRODUODENOSCOPY (EGD);  Surgeon: Rogene Houston, MD;  [facility];  Service: Endoscopy;  Laterality: N/A;  1:15   ESOPHAGOGASTRODUODENOSCOPY (EGD) WITH PROPOFOL N/A 09/05/2019   Procedure: ESOPHAGOGASTRODUODENOSCOPY (EGD) WITH PROPOFOL;  Surgeon: Rogene Houston, MD;  [facility];  Service: Endoscopy;  Laterality: N/A;  1005am   FOOT SURGERY  2015   left and right   GASTRIC BYPASS  2008   HERNIA REPAIR     HIATAL HERNIA REPAIR  2007   POLYPECTOMY N/A 11/19/2014   Procedure: POLYPECTOMY;  Surgeon: Daneil Dolin, MD;  Location: [facility];  Service: Endoscopy;  Laterality: N/A;  ileocecal valve, Hepatic Flexure, Splenic Flexure, Descending Colon    TUBAL LIGATION       SOCIAL HISTORY:  Social History   Socioeconomic History   Marital status: Married    Spouse name: Not on file   Number of children: 3   Years of education: Not on file   Highest education level: Not on file  Occupational History   Not on file  Tobacco Use   Smoking status: Never   Smokeless tobacco: Never  Vaping Use   Vaping Use: Never used  Substance and Sexual Activity   Alcohol use: No    Alcohol/week: 0.0 standard drinks   Drug use: No   Sexual activity: Yes    Birth control/protection: Surgical    Comment: tubal  Other Topics Concern   Not on file  Social History Narrative   Lives with husband.     Social Determinants of Health   Financial Resource Strain: Not on file  Food Insecurity: Not on file  Transportation Needs: Not on file  Physical Activity: Not on file  Stress: Not on file  Social Connections: Not on file  Intimate Partner Violence: Not on file    FAMILY HISTORY:  Family History  Problem Relation Age of Onset   HIV Sister    Hypertension Brother    Diabetes Brother    Kidney disease Brother    Hypertension Brother    Heart Problems Brother        No details.     Hypertension Brother    Colon cancer Neg Hx    Breast cancer Neg Hx     CURRENT MEDICATIONS:  Outpatient Encounter Medications as of 11/16/2021  Medication Sig   acetaminophen (TYLENOL) 650 MG CR tablet 2 tablets as needed   aspirin-acetaminophen-caffeine (EXCEDRIN MIGRAINE) 250-250-65 MG tablet 2 tablets as needed   cyclobenzaprine (FLEXERIL) 10 MG tablet Take 10 mg by mouth at bedtime as needed.   DULoxetine (CYMBALTA) 60 MG capsule 1 capsule   LORazepam (ATIVAN) 1 MG tablet Take by mouth.   meclizine (ANTIVERT) 12.5 MG tablet Take 12.5 mg by mouth every 4 (four) hours as needed.   modafinil (PROVIGIL) 200 MG tablet Take 200 mg by mouth 2 (two) times daily.   omeprazole (PRILOSEC) 40 MG capsule Take 40 mg by mouth daily.   phentermine (ADIPEX-P) 37.5 MG tablet Take  37.5 mg by mouth daily.   pregabalin (LYRICA) 75 MG capsule Take 75 mg by mouth 2 (two) times daily.   topiramate (TOPAMAX) 50 MG tablet Take 50 mg by mouth 2 (two) times daily.   traMADol (ULTRAM) 50 MG tablet See admin instructions.   No facility-administered encounter medications on file as of 11/16/2021.    ALLERGIES:  Allergies  Allergen Reactions   Viberzi [Eluxadoline] Other (See Comments)    Patient states that she has extreme abdominal cramping.     PHYSICAL EXAM:  ECOG PERFORMANCE STATUS: 1 - Symptomatic but completely ambulatory  There were no vitals filed for this visit. There were no vitals filed for this visit. Physical Exam Constitutional:      Appearance: Normal appearance. She is obese.  HENT:     Head: Normocephalic and atraumatic.     Mouth/Throat:     Mouth: Mucous membranes are moist.  Eyes:     Extraocular Movements: Extraocular movements intact.     Pupils: Pupils are equal, round, and reactive to light.  Cardiovascular:     Rate and Rhythm: Normal rate and regular rhythm.     Pulses: Normal pulses.     Heart sounds: Normal heart sounds.  Pulmonary:     Effort: Pulmonary effort  is normal.     Breath sounds: Normal breath sounds.  Abdominal:     General: Bowel sounds are normal.     Palpations: Abdomen is soft.     Tenderness: There is no abdominal tenderness.  Musculoskeletal:        General: No swelling.     Right lower leg: No edema.     Left lower leg: No edema.  Lymphadenopathy:     Cervical: No cervical adenopathy.  Skin:    General: Skin is warm and dry.  Neurological:     General: No focal deficit present.     Mental Status: She is alert and oriented to person, place, and time.  Psychiatric:        Mood and Affect: Mood normal.        Behavior: Behavior normal.     LABORATORY DATA:  I have reviewed the labs as listed.  CBC    Component Value Date/Time   WBC 9.3 11/10/2021 1507   RBC 4.66 11/10/2021 1507   HGB 14.4 11/10/2021 1507   HCT 46.0 11/10/2021 1507   PLT 232 11/10/2021 1507   MCV 98.7 11/10/2021 1507   MCH 30.9 11/10/2021 1507   MCHC 31.3 11/10/2021 1507   RDW 13.9 11/10/2021 1507   LYMPHSABS 2.0 11/10/2021 1507   MONOABS 0.8 11/10/2021 1507   EOSABS 0.1 11/10/2021 1507   BASOSABS 0.1 11/10/2021 1507   CMP Latest Ref Rng & Units 11/10/2021 10/17/2019 07/08/2019  Glucose 70 - 99 mg/dL 68(L) 99 84  BUN 6 - 20 mg/dL 19 16 12   Creatinine 0.44 - 1.00 mg/dL 0.88 0.66 0.74  Sodium 135 - 145 mmol/L 132(L) 140 142  Potassium 3.5 - 5.1 mmol/L 4.0 4.2 4.4  Chloride 98 - 111 mmol/L 106 110 104  CO2 22 - 32 mmol/L 23 24 27   Calcium 8.9 - 10.3 mg/dL 8.4(L) 8.6(L) 9.0  Total Protein 6.5 - 8.1 g/dL 7.2 7.1 6.8  Total Bilirubin 0.3 - 1.2 mg/dL 0.3 0.5 0.7  Alkaline Phos 38 - 126 U/L 91 114 -  AST 15 - 41 U/L 40 28 23  ALT 0 - 44 U/L 49(H) 29 27    DIAGNOSTIC IMAGING:  I  have independently reviewed the relevant imaging and discussed with the patient.  ASSESSMENT & PLAN: 1.  Iron deficiency anemia - Source of iron deficiency is likely malabsorption from prior gastric bypass surgery - Last Feraheme infusion was 12/04/2019, she was lost  to follow-up after March 2021 - No signs or symptoms of blood loss - Symptomatic with severe fatigue, headaches, and dyspnea on exertion - Most recent labs (11/10/2021): Hgb 14.4, ferritin 43, iron saturation 17% - PLAN: Recommend IV iron due to symptomatic iron deficiency without anemia - Repeat labs and RTC in 4 months.  2.  Vitamin B12 deficiency - Source of vitamin B12 deficiency is likely malabsorption from prior gastric bypass surgery - She was previously receiving monthly B12 injections, but was lost to follow-up after March 2021 - Most recent labs (11/10/2021): Low vitamin B12 at 100.  Folate normal at 8.2. - She is not currently taking any B12 supplements - PLAN: Recommend the patient restart monthly B12 injections. - Repeat B12, methylmalonic acid, and folate and RTC in 4 months.  3.  Vitamin D deficiency - Most recent vitamin D (11/10/2021): Low at 14.21 - She is not currently taking any Vitamin D supplements - PLAN: Recommend the patient start taking vitamin D 50,000 units weekly.  Repeat labs and RTC in 4 months.   PLAN SUMMARY & DISPOSITION: IV Venofer 300 mg x 3 Monthly B12 injections (first dose today)  Labs in 4 months Office visit 1 week after labs  All questions were answered. The patient knows to call the clinic with any problems, questions or concerns.  Medical decision making: Moderate  Time spent on visit: I spent 20 minutes counseling the patient face to face. The total time spent in the appointment was 30 minutes and more than 50% was on counseling.   Harriett Rush, PA-C  11/16/2021 8:57 AM

## 2021-11-16 ENCOUNTER — Encounter (HOSPITAL_COMMUNITY): Payer: Self-pay | Admitting: Physician Assistant

## 2021-11-16 ENCOUNTER — Other Ambulatory Visit: Payer: Self-pay

## 2021-11-16 ENCOUNTER — Inpatient Hospital Stay (HOSPITAL_COMMUNITY): Payer: BC Managed Care – PPO | Admitting: Physician Assistant

## 2021-11-16 ENCOUNTER — Inpatient Hospital Stay (HOSPITAL_COMMUNITY): Payer: BC Managed Care – PPO

## 2021-11-16 VITALS — BP 130/76 | HR 86 | Temp 96.9°F | Resp 18 | Ht 66.5 in | Wt 261.4 lb

## 2021-11-16 DIAGNOSIS — E559 Vitamin D deficiency, unspecified: Secondary | ICD-10-CM | POA: Diagnosis not present

## 2021-11-16 DIAGNOSIS — E538 Deficiency of other specified B group vitamins: Secondary | ICD-10-CM

## 2021-11-16 DIAGNOSIS — D508 Other iron deficiency anemias: Secondary | ICD-10-CM

## 2021-11-16 DIAGNOSIS — D509 Iron deficiency anemia, unspecified: Secondary | ICD-10-CM | POA: Diagnosis not present

## 2021-11-16 MED ORDER — CYANOCOBALAMIN 1000 MCG/ML IJ SOLN
1000.0000 ug | Freq: Once | INTRAMUSCULAR | Status: AC
  Administered 2021-11-16: 1000 ug via INTRAMUSCULAR
  Filled 2021-11-16: qty 1

## 2021-11-16 MED ORDER — VITAMIN D (ERGOCALCIFEROL) 1.25 MG (50000 UNIT) PO CAPS: 50000.0000 [IU] | ORAL_CAPSULE | ORAL | 11 refills | Status: DC

## 2021-11-16 NOTE — Patient Instructions (Signed)
[place] at [facility] Discharge Instructions  You were seen today by Tarri Abernethy PA-C for your vitamin and mineral deficiencies which are related to your history of gastric bypass surgery.  Your iron, vitamin B12, and vitamin D levels are all lower than they should be.  We will treat this by the following: - IV iron infusions x3 - Monthly B12 injections (first dose given today) - Prescription for vitamin D 50,000 units once per week  LABS: Return in 4 months for repeat labs  FOLLOW-UP APPOINTMENT: Office visit after labs   Thank you for choosing [place] at [facility] to provide your oncology and hematology care.  To afford each patient quality time with our provider, please arrive at least 15 minutes before your scheduled appointment time.   If you have a lab appointment with the [facility] please come in thru the Main Entrance and check in at the main information desk.  You need to re-schedule your appointment should you arrive 10 or more minutes late.  We strive to give you quality time with our providers, and arriving late affects you and other patients whose appointments are after yours.  Also, if you no show three or more times for appointments you may be dismissed from the clinic at the providers discretion.     Again, thank you for choosing [facility].  Our hope is that these requests will decrease the amount of time that you wait before being seen by our physicians.       _____________________________________________________________  Should you have questions after your visit to [facility], please contact our office at [phone] and follow the prompts.  Our office hours are 8:00 a.m. and 4:30 p.m. Monday - Friday.  Please note that voicemails left after 4:00 p.m. may not be returned until the following business day.  We are closed weekends and major holidays.  You do have access to a nurse  24-7, just call the main number to the clinic [phone] and do not press any options, hold on the line and a nurse will answer the phone.    For prescription refill requests, have your pharmacy contact our office and allow 72 hours.    Due to Covid, you will need to wear a mask upon entering the hospital. If you do not have a mask, a mask will be given to you at the Main Entrance upon arrival. For doctor visits, patients may have 1 support person age 53 or older with them. For treatment visits, patients can not have anyone with them due to social distancing guidelines and our immunocompromised population.

## 2021-11-16 NOTE — Progress Notes (Signed)
Patient tolerated B12 injection with no complaints voiced. Site clean and dry with no bruising or swelling noted at site. See MAR for details. Band aid applied.  Patient stable during and after injection. VSS with discharge and left in satisfactory condition with no s/s of distress noted. 

## 2021-11-16 NOTE — Patient Instructions (Signed)
[facility]  Discharge Instructions: Thank you for choosing [facility] to provide your oncology and hematology care.  If you have a lab appointment with the [facility], please come in thru the Main Entrance and check in at the main information desk.  Wear comfortable clothing and clothing appropriate for easy access to any Portacath or PICC line.   We strive to give you quality time with your provider. You may need to reschedule your appointment if you arrive late (15 or more minutes).  Arriving late affects you and other patients whose appointments are after yours.  Also, if you miss three or more appointments without notifying the office, you may be dismissed from the clinic at the providers discretion.      For prescription refill requests, have your pharmacy contact our office and allow 72 hours for refills to be completed.    Today you received the following B12 injection, return as scheduled.   To help prevent nausea and vomiting after your treatment, we encourage you to take your nausea medication as directed.  BELOW ARE SYMPTOMS THAT SHOULD BE REPORTED IMMEDIATELY: *FEVER GREATER THAN 100.4 F (38 C) OR HIGHER *CHILLS OR SWEATING *NAUSEA AND VOMITING THAT IS NOT CONTROLLED WITH YOUR NAUSEA MEDICATION *UNUSUAL SHORTNESS OF BREATH *UNUSUAL BRUISING OR BLEEDING *URINARY PROBLEMS (pain or burning when urinating, or frequent urination) *BOWEL PROBLEMS (unusual diarrhea, constipation, pain near the anus) TENDERNESS IN MOUTH AND THROAT WITH OR WITHOUT PRESENCE OF ULCERS (sore throat, sores in mouth, or a toothache) UNUSUAL RASH, SWELLING OR PAIN  UNUSUAL VAGINAL DISCHARGE OR ITCHING   Items with * indicate a potential emergency and should be followed up as soon as possible or go to the Emergency Department if any problems should occur.  Please show the CHEMOTHERAPY ALERT CARD or IMMUNOTHERAPY ALERT CARD at check-in to the Emergency Department and triage  nurse.  Should you have questions after your visit or need to cancel or reschedule your appointment, please contact [facility] [phone]  and follow the prompts.  Office hours are 8:00 a.m. to 4:30 p.m. Monday - Friday. Please note that voicemails left after 4:00 p.m. may not be returned until the following business day.  We are closed weekends and major holidays. You have access to a nurse at all times for urgent questions. Please call the main number to the clinic [phone] and follow the prompts.  For any non-urgent questions, you may also contact your provider using MyChart. We now offer e-Visits for anyone 19 and older to request care online for non-urgent symptoms. For details visit [email or web address].   Also download the MyChart app! Go to the app store, search "MyChart", open the app, select [facility], and log in with your MyChart username and password.  Due to Covid, a mask is required upon entering the hospital/clinic. If you do not have a mask, one will be given to you upon arrival. For doctor visits, patients may have 1 support person aged 76 or older with them. For treatment visits, patients cannot have anyone with them due to current Covid guidelines and our immunocompromised population.

## 2021-11-24 ENCOUNTER — Ambulatory Visit: Payer: BC Managed Care – PPO | Admitting: Cardiology

## 2021-11-24 ENCOUNTER — Encounter (HOSPITAL_COMMUNITY): Payer: Self-pay

## 2021-11-24 ENCOUNTER — Encounter: Payer: Self-pay | Admitting: Cardiology

## 2021-11-24 ENCOUNTER — Other Ambulatory Visit: Payer: Self-pay

## 2021-11-24 ENCOUNTER — Inpatient Hospital Stay (HOSPITAL_COMMUNITY): Payer: BC Managed Care – PPO

## 2021-11-24 ENCOUNTER — Ambulatory Visit (INDEPENDENT_AMBULATORY_CARE_PROVIDER_SITE_OTHER): Payer: BC Managed Care – PPO

## 2021-11-24 VITALS — BP 128/84 | HR 80 | Temp 98.0°F | Resp 18 | Ht 66.5 in | Wt 269.4 lb

## 2021-11-24 VITALS — BP 140/100 | HR 83 | Ht 66.5 in | Wt 270.0 lb

## 2021-11-24 DIAGNOSIS — R079 Chest pain, unspecified: Secondary | ICD-10-CM | POA: Diagnosis not present

## 2021-11-24 DIAGNOSIS — I370 Nonrheumatic pulmonary valve stenosis: Secondary | ICD-10-CM | POA: Diagnosis not present

## 2021-11-24 DIAGNOSIS — R011 Cardiac murmur, unspecified: Secondary | ICD-10-CM | POA: Diagnosis not present

## 2021-11-24 DIAGNOSIS — I10 Essential (primary) hypertension: Secondary | ICD-10-CM

## 2021-11-24 DIAGNOSIS — R002 Palpitations: Secondary | ICD-10-CM

## 2021-11-24 DIAGNOSIS — Q250 Patent ductus arteriosus: Secondary | ICD-10-CM | POA: Insufficient documentation

## 2021-11-24 DIAGNOSIS — D509 Iron deficiency anemia, unspecified: Secondary | ICD-10-CM | POA: Diagnosis not present

## 2021-11-24 DIAGNOSIS — D508 Other iron deficiency anemias: Secondary | ICD-10-CM

## 2021-11-24 DIAGNOSIS — E538 Deficiency of other specified B group vitamins: Secondary | ICD-10-CM

## 2021-11-24 MED ORDER — SODIUM CHLORIDE 0.9 % IV SOLN: Freq: Once | INTRAVENOUS | Status: AC

## 2021-11-24 MED ORDER — ACETAMINOPHEN 325 MG PO TABS
650.0000 mg | ORAL_TABLET | Freq: Once | ORAL | Status: AC
  Administered 2021-11-24: 650 mg via ORAL
  Filled 2021-11-24: qty 2

## 2021-11-24 MED ORDER — METOPROLOL TARTRATE 100 MG PO TABS: ORAL_TABLET | ORAL | 0 refills | Status: DC

## 2021-11-24 MED ORDER — SODIUM CHLORIDE 0.9 % IV SOLN
300.0000 mg | Freq: Once | INTRAVENOUS | Status: AC
  Administered 2021-11-24: 300 mg via INTRAVENOUS
  Filled 2021-11-24: qty 300

## 2021-11-24 MED ORDER — LORATADINE 10 MG PO TABS
10.0000 mg | ORAL_TABLET | Freq: Once | ORAL | Status: AC
  Administered 2021-11-24: 10 mg via ORAL
  Filled 2021-11-24: qty 1

## 2021-11-24 NOTE — Patient Instructions (Signed)
[facility]  Discharge Instructions: Thank you for choosing [facility] to provide your oncology and hematology care.  If you have a lab appointment with the Cancer Center, please come in thru the Main Entrance and check in at the main information desk.  Wear comfortable clothing and clothing appropriate for easy access to any Portacath or PICC line.   We strive to give you quality time with your provider. You may need to reschedule your appointment if you arrive late (15 or more minutes).  Arriving late affects you and other patients whose appointments are after yours.  Also, if you miss three or more appointments without notifying the office, you may be dismissed from the clinic at the provider's discretion.      For prescription refill requests, have your pharmacy contact our office and allow 72 hours for refills to be completed.    Today you received the following: Venofer, return as scheduled.   To help prevent nausea and vomiting after your treatment, we encourage you to take your nausea medication as directed.  BELOW ARE SYMPTOMS THAT SHOULD BE REPORTED IMMEDIATELY: *FEVER GREATER THAN 100.4 F (38 C) OR HIGHER *CHILLS OR SWEATING *NAUSEA AND VOMITING THAT IS NOT CONTROLLED WITH YOUR NAUSEA MEDICATION *UNUSUAL SHORTNESS OF BREATH *UNUSUAL BRUISING OR BLEEDING *URINARY PROBLEMS (pain or burning when urinating, or frequent urination) *BOWEL PROBLEMS (unusual diarrhea, constipation, pain near the anus) TENDERNESS IN MOUTH AND THROAT WITH OR WITHOUT PRESENCE OF ULCERS (sore throat, sores in mouth, or a toothache) UNUSUAL RASH, SWELLING OR PAIN  UNUSUAL VAGINAL DISCHARGE OR ITCHING   Items with * indicate a potential emergency and should be followed up as soon as possible or go to the Emergency Department if any problems should occur.  Please show the CHEMOTHERAPY ALERT CARD or IMMUNOTHERAPY ALERT CARD at check-in to the Emergency Department and triage  nurse.  Should you have questions after your visit or need to cancel or reschedule your appointment, please contact [facility] [phone]  and follow the prompts.  Office hours are 8:00 a.m. to 4:30 p.m. Monday - Friday. Please note that voicemails left after 4:00 p.m. may not be returned until the following business day.  We are closed weekends and major holidays. You have access to a nurse at all times for urgent questions. Please call the main number to the clinic [phone] and follow the prompts.  For any non-urgent questions, you may also contact your provider using MyChart. We now offer e-Visits for anyone 18 and older to request care online for non-urgent symptoms. For details visit [email or web address].   Also download the MyChart app! Go to the app store, search "MyChart", open the app, select [facility], and log in with your MyChart username and password.  Due to Covid, a mask is required upon entering the hospital/clinic. If you do not have a mask, one will be given to you upon arrival. For doctor visits, patients may have 1 support person aged 18 or older with them. For treatment visits, patients cannot have anyone with them due to current Covid guidelines and our immunocompromised population.  

## 2021-11-24 NOTE — Patient Instructions (Signed)
Medication Instructions:  The current medical regimen is effective;  continue present plan and medications.  *If you need a refill on your cardiac medications before your next appointment, please call your pharmacy*  Testing/Procedures: Your physician has requested that you have an echocardiogram. Echocardiography is a painless test that uses sound waves to create images of your heart. It provides your doctor with information about the size and shape of your heart and how well your hearts chambers and valves are working. This procedure takes approximately one hour. There are no restrictions for this procedure.    Your cardiac CT will be scheduled at:   [facility] [address] [place], [place] [address] [phone]  Please arrive at the [facility] main entrance (entrance A) of [facility] 30 minutes prior to test start time. You can use the FREE valet parking offered at the main entrance (encouraged to control the heart rate for the test) Proceed to the [facility] Radiology Department (first floor) to check-in and test prep.  Please follow these instructions carefully (unless otherwise directed):  On the Night Before the Test: Be sure to Drink plenty of water. Do not consume any caffeinated/decaffeinated beverages or chocolate 12 hours prior to your test. Do not take any antihistamines 12 hours prior to your test.  On the Day of the Test: Drink plenty of water until 1 hour prior to the test. Do not eat any food 4 hours prior to the test. You may take your regular medications prior to the test.  Take metoprolol (Lopressor) two hours prior to test. HOLD Furosemide/Hydrochlorothiazide morning of the test. FEMALES- please wear underwire-free bra if available, avoid dresses & tight clothing  After the Test: Drink plenty of water. After receiving IV contrast, you may experience a mild flushed feeling. This is normal. On occasion, you may experience a mild  rash up to 24 hours after the test. This is not dangerous. If this occurs, you can take Benadryl 25 mg and increase your fluid intake. If you experience trouble breathing, this can be serious. If it is severe call 911 IMMEDIATELY. If it is mild, please call our office. If you take any of these medications: Glipizide/Metformin, Avandament, Glucavance, please do not take 48 hours after completing test unless otherwise instructed.  We will call to schedule your test 2-4 weeks out understanding that some insurance companies will need an authorization prior to the service being performed.   For non-scheduling related questions, please contact the cardiac imaging nurse navigator should you have any questions/concerns: Marchia Bond, Cardiac Imaging Nurse Navigator Gordy Clement, Cardiac Imaging Nurse Navigator  Heart and Vascular Services Direct Office Dial: [phone]   For scheduling needs, including cancellations and rescheduling, please call [place], [phone].  ZIO XT- Long Term Monitor Instructions  Your physician has requested you wear a ZIO patch monitor for 14 days.  This is a single patch monitor. [employer] supplies one patch monitor per enrollment. Additional stickers are not available. Please do not apply patch if you will be having a Nuclear Stress Test,  Echocardiogram, Cardiac CT, MRI, or Chest Xray during the period you would be wearing the  monitor. The patch cannot be worn during these tests. You cannot remove and re-apply the  ZIO XT patch monitor.  Your ZIO patch monitor will be mailed 3 day USPS to your address on file. It may take 3-5 days  to receive your monitor after you have been enrolled.  Once you have received your  monitor, please review the enclosed instructions. Your monitor  has already been registered assigning a specific monitor serial # to you.  Billing and Patient Assistance Program Information  We have supplied [employer] with any of your  insurance information on file for billing purposes. [employer] offers a sliding scale Patient Assistance Program for patients that do not have  insurance, or whose insurance does not completely cover the cost of the ZIO monitor.  You must apply for the Patient Assistance Program to qualify for this discounted rate.  To apply, please call [employer] at [phone], select option 4, select option 2, ask to apply for  Patient Assistance Program. Theodore Demark will ask your household income, and how many people  are in your household. They will quote your out-of-pocket cost based on that information.  [employer] will also be able to set up a 31-month, interest-free payment plan if needed.  Applying the monitor   Shave hair from upper left chest.  Hold abrader disc by orange tab. Rub abrader in 40 strokes over the upper left chest as  indicated in your monitor instructions.  Clean area with 4 enclosed alcohol pads. Let dry.  Apply patch as indicated in monitor instructions. Patch will be placed under collarbone on left  side of chest with arrow pointing upward.  Rub patch adhesive wings for 2 minutes. Remove white label marked "1". Remove the white  label marked "2". Rub patch adhesive wings for 2 additional minutes.  While looking in a mirror, press and release button in center of patch. A small green light will  flash 3-4 times. This will be your only indicator that the monitor has been turned on.  Do not shower for the first 24 hours. You may shower after the first 24 hours.  Press the button if you feel a symptom. You will hear a small click. Record Date, Time and  Symptom in the Patient Logbook.  When you are ready to remove the patch, follow instructions on the last 2 pages of Patient  Logbook. Stick patch monitor onto the last page of Patient Logbook.  Place Patient Logbook in the blue and white box. Use locking tab on box and tape box closed  securely. The blue and white box has prepaid postage on  it. Please place it in the mailbox as  soon as possible. Your physician should have your test results approximately 7 days after the  monitor has been mailed back to [facility].  Call [facility] at [phone] if you have questions regarding  your ZIO XT patch monitor. Call them immediately if you see an orange light blinking on your  monitor.  If your monitor falls off in less than 4 days, contact our Monitor department at [phone].  If your monitor becomes loose or falls off after 4 days call [employer] at [phone] for  suggestions on securing your monitor  Follow-Up: At [facility], you and your health needs are our priority.  As part of our continuing mission to provide you with exceptional heart care, we have created designated Provider Care Teams.  These Care Teams include your primary Cardiologist (physician) and Advanced Practice Providers (APPs -  Physician Assistants and Nurse Practitioners) who all work together to provide you with the care you need, when you need it.  We recommend signing up for the patient portal called "MyChart".  Sign up information is provided on this After Visit Summary.  MyChart is used to connect with patients for Virtual Visits (Telemedicine).  Patients are able to view lab/test results, encounter notes, upcoming appointments, etc.  Non-urgent messages can be sent to your provider as well.   To learn more about what you can do with MyChart, go to [email or web address].    Your next appointment:   Follow up will be based on the results of the above testing.  Thank you for choosing [facility]!!

## 2021-11-24 NOTE — Progress Notes (Unsigned)
Enrolled for [employer] to mail a ZIO XT long term holter monitor to the patients address on file.  

## 2021-11-24 NOTE — Assessment & Plan Note (Signed)
We will go ahead and check a coronary CT scan with possible FFR analysis.  I want to make sure that she does not have any evidence of underlying CAD contributing to her chest discomfort that resulted in [facility] emergency department evaluation.  Troponins were normal.  EKG at that time was reportedly normal.  Prior EKG as described above.  Her chest discomfort could be related as well with fibromyalgia but we must make sure that she does not have any underlying CAD.

## 2021-11-24 NOTE — Progress Notes (Signed)
Cardiology Office Note:    Date:  11/24/2021   ID:  Jody Stokes, DOB 1961/06/09, MRN [id number]  PCP:  Denny Levy, PA  Cardiologist:  None    Referring MD: Jody Nation, MD     History of Present Illness:    Jody Stokes is a 61 y.o. female with a hx of congenital heart disease, iron-deficiency anemia, sleep apnea on CPAP, and obesity here today for the evaluation of palpitations and intermittent chest pain at the request of Dr. Jimmye Norman. She had cardiac surgery at 62 months old.   She was seen in the ED 11/22/21 for chest paint determined to be non-cardiac in etiology. Starting at [facility] while at work, she suddenly felt an anterior substernal chest pain. The pain was described as a pressure and tightness that worsened with deep inhalation. She stated she felt short of breath and not able to get enough oxygen. Her blood pressure at home was reportedly elevated. Her blood pressure upon arrival was 147/87. The pain was reproducible but improved after Toradol. Troponin was normal and D-dimer was negative. She was discharged in stable condition.   Today, she endorsed having heart surgery at 60 months old. A heart murmur was found at 60 weeks old. In 08/23/62, she was diagnosed with patent ductus arteriosus (PDA) and congestive heart failure. She was started on Digoxin and had a cardiac catheterization. On 07/03/61, the final diagnosis was PDA. She underwent cardiac surgery November 1964 with ligation of the PDA and uncovered mild pulmonary stenosis. The surgeries were performed in [facility] in [place]. Of note, [facility] [place]. She has a scar under her L breast from the operation. She continued to have a heart murmur which resolved after having her first child.   Recently, she reports central chest pain. She feels that she cannot take a full breath. She presented to the ER in [facility] due to a worrying episode of chest pain. She felt her  heart racing and sharp pains in her central chest radiating to her shoulders. The ER told her everything was normal. However, she continues to feel the pain and heart racing especially when she lays on her side. Another doctor mentioned the pain is anxiety but she does not believe it is anxiety related.   She started tracking her blood pressures at home and noticed her readings are higher than they used to be.   She had a pulmonary embolism in 2017 from a hiatal hernia and has a stent placed.   She denies any shortness of breath, lightheadedness, headaches, syncope, orthopnea, PND, lower extremity edema or exertional symptoms.  Past Medical History:  Diagnosis Date   Anemia    B12 deficiency 03/14/2016   Congenital heart disease    Depression    Fibromyalgia    Gastric bypass status for obesity 03/14/2016   Iron deficiency anemia 03/14/2016   Presence of IVC filter    Pulmonary embolism (HCC)    Rheumatoid arthritis (HCC)    Rheumatoid   Sleep apnea    uses CPAP   Tubular adenoma of colon     Past Surgical History:  Procedure Laterality Date   BIOPSY N/A 11/19/2014   Procedure: BIOPSY;  Surgeon: Daneil Dolin, MD;  Location: [facility];  Service: Endoscopy;  Laterality: N/A;  Ascending Colon   BIOPSY  09/22/2016   Procedure: BIOPSY;  Surgeon: Rogene Houston, MD;  Location: [facility];  Service: Endoscopy;;  small bowel   BIOPSY  03/08/2018   Procedure: BIOPSY;  Surgeon: Rogene Houston, MD;  Location: [facility];  Service: Endoscopy;;  colon   CARPAL TUNNEL RELEASE Bilateral    CHOLECYSTECTOMY  2009   COLONOSCOPY  07/2012   transverse colon 5 mm polyp removed   COLONOSCOPY WITH PROPOFOL N/A 11/19/2014   RMR: mutilple colonic polyps removed as described above. Pancolonic diverticulosis(few).  Status post mucosal biopsy to assess for microscopic coliits. /    COLONOSCOPY WITH PROPOFOL N/A 03/08/2018   Procedure: COLONOSCOPY WITH PROPOFOL;  Surgeon: Rogene Houston, MD;  Location:  [facility];  Service: Endoscopy;  Laterality: N/A;   ESOPHAGOGASTRODUODENOSCOPY N/A 09/22/2016   Procedure: ESOPHAGOGASTRODUODENOSCOPY (EGD);  Surgeon: Rogene Houston, MD;  Location: [facility];  Service: Endoscopy;  Laterality: N/A;  1:15   ESOPHAGOGASTRODUODENOSCOPY (EGD) WITH PROPOFOL N/A 09/05/2019   Procedure: ESOPHAGOGASTRODUODENOSCOPY (EGD) WITH PROPOFOL;  Surgeon: Rogene Houston, MD;  Location: [facility];  Service: Endoscopy;  Laterality: N/A;  1005am   FOOT SURGERY  2015   left and right   GASTRIC BYPASS  2008   HERNIA REPAIR     HIATAL HERNIA REPAIR  2007   POLYPECTOMY N/A 11/19/2014   Procedure: POLYPECTOMY;  Surgeon: Daneil Dolin, MD;  Location: [facility];  Service: Endoscopy;  Laterality: N/A;  ileocecal valve, Hepatic Flexure, Splenic Flexure, Descending Colon    TUBAL LIGATION      Current Medications: Current Meds  Medication Sig   acetaminophen (TYLENOL) 650 MG CR tablet 2 tablets as needed   aspirin-acetaminophen-caffeine (EXCEDRIN MIGRAINE) 250-250-65 MG tablet 2 tablets as needed   cyanocobalamin (,VITAMIN B-12,) 1000 MCG/ML injection 100 mcg.   diphenhydramine-acetaminophen (TYLENOL PM) 25-500 MG TABS tablet Take by mouth.   DULoxetine (CYMBALTA) 60 MG capsule 1 capsule   folic acid (FOLVITE) 1 MG tablet Take 1 mg by mouth daily.   losartan (COZAAR) 25 MG tablet Take 25 mg by mouth daily.   meclizine (ANTIVERT) 12.5 MG tablet Take 12.5 mg by mouth every 4 (four) hours as needed.   metoprolol tartrate (LOPRESSOR) 100 MG tablet Take one tablet 2 hours before your coronary ct scan   ondansetron (ZOFRAN-ODT) 8 MG disintegrating tablet Take 8 mg by mouth daily as needed.   OTREXUP 12.5 MG/0.4ML SOAJ Inject into the skin.   predniSONE (DELTASONE) 10 MG tablet Take 2 tabs daily x 3 days, then 1.5 tabs daily x 3 days, then 1 tab daily x 3 days, then 0.5 tabs daily x 3 days   promethazine (PHENERGAN) 25 MG tablet Take 25 mg by mouth 3 (three) times daily as needed.    Vitamin D, Ergocalciferol, (DRISDOL) 1.25 MG (50000 UNIT) CAPS capsule Take 1 capsule (50,000 Units total) by mouth every 7 (seven) days.     Allergies:   Viberzi [eluxadoline]   Social History   Socioeconomic History   Marital status: Married    Spouse name: Not on file   Number of children: 3   Years of education: Not on file   Highest education level: Not on file  Occupational History   Not on file  Tobacco Use   Smoking status: Never   Smokeless tobacco: Never  Vaping Use   Vaping Use: Never used  Substance and Sexual Activity   Alcohol use: No    Alcohol/week: 0.0 standard drinks   Drug use: No   Sexual activity: Yes    Birth control/protection: Surgical    Comment: tubal  Other Topics Concern   Not on file  Social History Narrative   Lives with husband.     Social Determinants of Health   Financial Resource Strain: Not on file  Food Insecurity: Not on file  Transportation Needs: Not on file  Physical Activity: Not on file  Stress: Not on file  Social Connections: Not on file     Family History: The patient's family history includes Diabetes in her brother; HIV in her sister; Heart Problems in her brother; Hypertension in her brother, brother, and brother; Kidney disease in her brother. There is no history of Colon cancer or Breast cancer.  ROS:   Please see the history of present illness.    (+) Palpitations (+) Chest pain All other systems reviewed and negative.   EKGs/Labs/Other Studies Reviewed:    The following studies were reviewed today: Monitor 03/28/17 NSR, rate atrial and ventricular ectopy. No sustained arrhythmias. Symptoms of SOB, chest pain or tightness or palpitations occurred during NSR.  Echo 03/28/17 - Left ventricle: The cavity size was normal. Wall thickness was    increased in a pattern of mild LVH. Systolic function was normal.    The estimated ejection fraction was in the range of 60% to 65%.    Wall motion was normal; there  were no regional wall motion    abnormalities. The study is not technically sufficient to allow    evaluation of LV diastolic function.   EKG:  EKG was not ordered today 11/01/21: Sinus rhythm, rate 86 bpm; PACs  Recent Labs: 11/10/2021: ALT 49; BUN 19; Creatinine, Ser 0.88; Hemoglobin 14.4; Platelets 232; Potassium 4.0; Sodium 132   Recent Lipid Panel No results found for: CHOL, TRIG, HDL, CHOLHDL, VLDL, LDLCALC, LDLDIRECT  CHA2DS2-VASc Score =   [ ] .  Therefore, the patient's annual risk of stroke is   %.      Hemoglobin 14.4 ALT 36 creatinine 0.7 potassium 4.4  Physical Exam:    VS:  BP (!) 140/100 (BP Location: Left Arm, Patient Position: Sitting, Cuff Size: Normal)    Pulse 83    Ht 5' 6.5" (1.689 m)    Wt 270 lb (122.5 kg)    LMP 03/01/2017    SpO2 93%    BMI 42.93 kg/m     Wt Readings from Last 3 Encounters:  11/24/21 270 lb (122.5 kg)  11/24/21 269 lb 6.4 oz (122.2 kg)  11/16/21 261 lb 6.4 oz (118.6 kg)     GEN:  Well nourished, well developed in no acute distress HEENT: Normal NECK: No JVD; No carotid bruits LYMPHATICS: No lymphadenopathy CARDIAC: RRR, 1/6 murmur, no rubs or gallops RESPIRATORY:  Clear to auscultation without rales, wheezing or rhonchi  ABDOMEN: Soft, non-tender, non-distended MUSCULOSKELETAL:  No edema; No deformity  SKIN: Warm and dry NEUROLOGIC:  Alert and oriented x 3 PSYCHIATRIC:  Normal affect   ASSESSMENT:    1. Murmur, cardiac   2. Patent ductus arteriosus   3. Chest pain of uncertain etiology   4. Nonrheumatic pulmonary valve stenosis   5. Essential hypertension   6. Palpitations    PLAN:    Patent ductus arteriosus-postsurgical ligation 1964 Surgical ligation in [place] in November 1964.  This was after heart murmur was discovered.  She had a diagnostic cardiac catheterization that proved her PDA.  She had a heart murmur subsequently that was secondary to pulmonic stenosis.  She states after her first child her murmur went away.  I  do hear a soft murmur  on exam.  We will go ahead and check an echocardiogram.  Chest pain of uncertain etiology We will go ahead and check a coronary CT scan with possible FFR analysis.  I want to make sure that she does not have any evidence of underlying CAD contributing to her chest discomfort that resulted in [facility] emergency department evaluation.  Troponins were normal.  EKG at that time was reportedly normal.  Prior EKG as described above.  Her chest discomfort could be related as well with fibromyalgia but we must make sure that she does not have any underlying CAD.  Pulmonic stenosis We will go ahead and check echocardiogram.  Supposedly she had pulmonic stenosis during evaluation at an early age for PDA ligation.  Essential hypertension She showed me several blood pressure readings from home.  They are elevated.  Continue with losartan 25 mg.  Further titration per primary team.  Palpitations Racing heartbeat occasionally at night.  Occasional skips otherwise.  We will check a ZIO monitor to ensure that there are no adverse arrhythmias.       Medication Adjustments/Labs and Tests Ordered: Current medicines are reviewed at length with the patient today.  Concerns regarding medicines are outlined above.  Orders Placed This Encounter  Procedures   CT CORONARY MORPH W/CTA COR W/SCORE W/CA W/CM &/OR WO/CM   LONG TERM MONITOR (3-14 DAYS)   ECHOCARDIOGRAM COMPLETE   Meds ordered this encounter  Medications   metoprolol tartrate (LOPRESSOR) 100 MG tablet    Sig: Take one tablet 2 hours before your coronary ct scan    Dispense:  1 tablet    Refill:  0   I,Mykaella Javier,acting as a scribe for [employer], MD.,have documented all relevant documentation on the behalf of Candee Furbish, MD,as directed by  Candee Furbish, MD while in the presence of Candee Furbish, MD.  I, Candee Furbish, MD, have reviewed all documentation for this visit. The documentation on 11/24/21 for the exam,  diagnosis, procedures, and orders are all accurate and complete.   Signed, Candee Furbish, MD  11/24/2021 2:35 PM    [facility]

## 2021-11-24 NOTE — Progress Notes (Signed)
Patient tolerated iron infusion with no complaints voiced.  Peripheral IV site clean and dry with good blood return noted before and after infusion.  Band aid applied.  VSS with discharge and left in satisfactory condition with no s/s of distress noted.   

## 2021-11-24 NOTE — Assessment & Plan Note (Signed)
Racing heartbeat occasionally at night.  Occasional skips otherwise.  We will check a ZIO monitor to ensure that there are no adverse arrhythmias.

## 2021-11-24 NOTE — Assessment & Plan Note (Signed)
She showed me several blood pressure readings from home.  They are elevated.  Continue with losartan 25 mg.  Further titration per primary team.

## 2021-11-24 NOTE — Assessment & Plan Note (Signed)
We will go ahead and check echocardiogram.  Supposedly she had pulmonic stenosis during evaluation at an early age for PDA ligation.

## 2021-11-24 NOTE — Assessment & Plan Note (Signed)
Surgical ligation in [place] in November 1964.  This was after heart murmur was discovered.  She had a diagnostic cardiac catheterization that proved her PDA.  She had a heart murmur subsequently that was secondary to pulmonic stenosis.  She states after her first child her murmur went away.  I do hear a soft murmur on exam.  We will go ahead and check an echocardiogram.

## 2021-11-25 ENCOUNTER — Other Ambulatory Visit: Payer: Self-pay | Admitting: Cardiology

## 2021-12-02 ENCOUNTER — Inpatient Hospital Stay (HOSPITAL_COMMUNITY): Payer: BC Managed Care – PPO | Attending: Hematology

## 2021-12-02 ENCOUNTER — Other Ambulatory Visit: Payer: Self-pay

## 2021-12-02 VITALS — BP 139/95 | HR 77 | Temp 97.3°F | Resp 20

## 2021-12-02 DIAGNOSIS — D508 Other iron deficiency anemias: Secondary | ICD-10-CM

## 2021-12-02 DIAGNOSIS — D509 Iron deficiency anemia, unspecified: Secondary | ICD-10-CM | POA: Insufficient documentation

## 2021-12-02 DIAGNOSIS — E538 Deficiency of other specified B group vitamins: Secondary | ICD-10-CM | POA: Diagnosis not present

## 2021-12-02 MED ORDER — SODIUM CHLORIDE 0.9 % IV SOLN: Freq: Once | INTRAVENOUS | Status: AC

## 2021-12-02 MED ORDER — ACETAMINOPHEN 325 MG PO TABS
650.0000 mg | ORAL_TABLET | Freq: Once | ORAL | Status: AC
  Administered 2021-12-02: 650 mg via ORAL
  Filled 2021-12-02: qty 2

## 2021-12-02 MED ORDER — SODIUM CHLORIDE 0.9 % IV SOLN
300.0000 mg | Freq: Once | INTRAVENOUS | Status: AC
  Administered 2021-12-02: 300 mg via INTRAVENOUS
  Filled 2021-12-02: qty 300

## 2021-12-02 MED ORDER — LORATADINE 10 MG PO TABS
10.0000 mg | ORAL_TABLET | Freq: Once | ORAL | Status: AC
  Administered 2021-12-02: 10 mg via ORAL
  Filled 2021-12-02: qty 1

## 2021-12-02 NOTE — Patient Instructions (Signed)
[facility]  Discharge Instructions: Thank you for choosing [facility] to provide your oncology and hematology care.  If you have a lab appointment with the Cancer Center, please come in thru the Main Entrance and check in at the main information desk.  Wear comfortable clothing and clothing appropriate for easy access to any Portacath or PICC line.   We strive to give you quality time with your provider. You may need to reschedule your appointment if you arrive late (15 or more minutes).  Arriving late affects you and other patients whose appointments are after yours.  Also, if you miss three or more appointments without notifying the office, you may be dismissed from the clinic at the provider's discretion.      For prescription refill requests, have your pharmacy contact our office and allow 72 hours for refills to be completed.    Today you received Venofer IV iron infusion.     BELOW ARE SYMPTOMS THAT SHOULD BE REPORTED IMMEDIATELY: *FEVER GREATER THAN 100.4 F (38 C) OR HIGHER *CHILLS OR SWEATING *NAUSEA AND VOMITING THAT IS NOT CONTROLLED WITH YOUR NAUSEA MEDICATION *UNUSUAL SHORTNESS OF BREATH *UNUSUAL BRUISING OR BLEEDING *URINARY PROBLEMS (pain or burning when urinating, or frequent urination) *BOWEL PROBLEMS (unusual diarrhea, constipation, pain near the anus) TENDERNESS IN MOUTH AND THROAT WITH OR WITHOUT PRESENCE OF ULCERS (sore throat, sores in mouth, or a toothache) UNUSUAL RASH, SWELLING OR PAIN  UNUSUAL VAGINAL DISCHARGE OR ITCHING   Items with * indicate a potential emergency and should be followed up as soon as possible or go to the Emergency Department if any problems should occur.  Please show the CHEMOTHERAPY ALERT CARD or IMMUNOTHERAPY ALERT CARD at check-in to the Emergency Department and triage nurse.  Should you have questions after your visit or need to cancel or reschedule your appointment, please contact [facility]  [phone]  and follow the prompts.  Office hours are 8:00 a.m. to 4:30 p.m. Monday - Friday. Please note that voicemails left after 4:00 p.m. may not be returned until the following business day.  We are closed weekends and major holidays. You have access to a nurse at all times for urgent questions. Please call the main number to the clinic [phone] and follow the prompts.  For any non-urgent questions, you may also contact your provider using MyChart. We now offer e-Visits for anyone 18 and older to request care online for non-urgent symptoms. For details visit [email or web address].   Also download the MyChart app! Go to the app store, search "MyChart", open the app, select [facility], and log in with your MyChart username and password.  Due to Covid, a mask is required upon entering the hospital/clinic. If you do not have a mask, one will be given to you upon arrival. For doctor visits, patients may have 1 support person aged 18 or older with them. For treatment visits, patients cannot have anyone with them due to current Covid guidelines and our immunocompromised population.  

## 2021-12-02 NOTE — Progress Notes (Signed)
Pt presents today for Venofer IV iron infusion per provider's order. Vital signs stable and pt voiced no new complaints at this time.  Peripheral IV started with good blood return pre and post infusion.  Venofer 300 mg  given today per MD orders. Tolerated infusion without adverse affects. Vital signs stable. No complaints at this time. Discharged from clinic ambulatory in stable condition. Alert and oriented x 3. F/U with [facility] as scheduled.   

## 2021-12-05 ENCOUNTER — Telehealth (HOSPITAL_COMMUNITY): Payer: Self-pay | Admitting: *Deleted

## 2021-12-05 NOTE — Telephone Encounter (Signed)
Reaching out to patient to offer assistance regarding upcoming cardiac imaging study; pt verbalizes understanding of appt date/time, parking situation and where to check in, pre-test NPO status and medications ordered, and verified current allergies; name and call back number provided for further questions should they arise ? ?Gordy Clement RN Navigator Cardiac Imaging ?[facility] ?[phone] office ?[phone] cell ? ?Patient to take '100mg'$  metoprolol tartrate two hours prior to her cardiac CT Scan.  She is aware to arrive at 4pm for her 4:30pm scan. ?

## 2021-12-06 ENCOUNTER — Other Ambulatory Visit: Payer: Self-pay

## 2021-12-06 ENCOUNTER — Ambulatory Visit (HOSPITAL_COMMUNITY)
Admission: RE | Admit: 2021-12-06 | Discharge: 2021-12-06 | Disposition: A | Discharge status: Home / Self Care | Payer: BC Managed Care – PPO | Source: Ambulatory Visit | Attending: Cardiology | Admitting: Cardiology

## 2021-12-06 DIAGNOSIS — R079 Chest pain, unspecified: Secondary | ICD-10-CM | POA: Insufficient documentation

## 2021-12-06 MED ORDER — DILTIAZEM HCL 25 MG/5ML IV SOLN
5.0000 mg | INTRAVENOUS | Status: AC | PRN
  Administered 2021-12-06: 5 mg via INTRAVENOUS

## 2021-12-06 MED ORDER — METOPROLOL TARTRATE 5 MG/5ML IV SOLN: 10.0000 mg | INTRAVENOUS | Status: DC | PRN

## 2021-12-06 MED ORDER — METOPROLOL TARTRATE 5 MG/5ML IV SOLN
INTRAVENOUS | Status: AC
  Administered 2021-12-06: 10 mg via INTRAVENOUS
  Filled 2021-12-06: qty 20

## 2021-12-06 MED ORDER — DILTIAZEM HCL 25 MG/5ML IV SOLN
INTRAVENOUS | Status: AC
  Administered 2021-12-06: 5 mg via INTRAVENOUS
  Filled 2021-12-06: qty 5

## 2021-12-06 MED ORDER — NITROGLYCERIN 0.4 MG SL SUBL
SUBLINGUAL_TABLET | SUBLINGUAL | Status: AC
  Administered 2021-12-06: 0.8 mg via SUBLINGUAL
  Filled 2021-12-06: qty 2

## 2021-12-06 MED ORDER — IOHEXOL 350 MG/ML SOLN
95.0000 mL | Freq: Once | INTRAVENOUS | Status: AC | PRN
  Administered 2021-12-06: 95 mL via INTRAVENOUS

## 2021-12-06 MED ORDER — NITROGLYCERIN 0.4 MG SL SUBL: 0.8000 mg | SUBLINGUAL_TABLET | Freq: Once | SUBLINGUAL | Status: AC

## 2021-12-07 ENCOUNTER — Ambulatory Visit (HOSPITAL_COMMUNITY): Payer: BC Managed Care – PPO | Attending: Cardiovascular Disease

## 2021-12-07 DIAGNOSIS — Q250 Patent ductus arteriosus: Secondary | ICD-10-CM | POA: Diagnosis not present

## 2021-12-07 DIAGNOSIS — R002 Palpitations: Secondary | ICD-10-CM | POA: Diagnosis not present

## 2021-12-07 DIAGNOSIS — R011 Cardiac murmur, unspecified: Secondary | ICD-10-CM | POA: Diagnosis present

## 2021-12-07 LAB — ECHOCARDIOGRAM COMPLETE
Area-P 1/2: 3.54 cm2
S' Lateral: 3.1 cm

## 2021-12-08 ENCOUNTER — Encounter: Payer: Self-pay | Admitting: Cardiology

## 2021-12-09 ENCOUNTER — Other Ambulatory Visit: Payer: Self-pay | Admitting: *Deleted

## 2021-12-09 ENCOUNTER — Ambulatory Visit (HOSPITAL_COMMUNITY): Payer: BC Managed Care – PPO

## 2021-12-09 DIAGNOSIS — I2510 Atherosclerotic heart disease of native coronary artery without angina pectoris: Secondary | ICD-10-CM

## 2021-12-09 DIAGNOSIS — Z79899 Other long term (current) drug therapy: Secondary | ICD-10-CM

## 2021-12-09 MED ORDER — ROSUVASTATIN CALCIUM 10 MG PO TABS: 10.0000 mg | ORAL_TABLET | Freq: Every day | ORAL | 3 refills | Status: DC

## 2021-12-09 NOTE — Progress Notes (Signed)
Jody Stokes  You 8 minutes ago (10:08 AM)  ? ?[facility] in [place] on [address] and a Friday or Monday appointment would be goo ?  ? ?You  Jody Stokes 45 minutes ago (9:32 AM)  ? ?We have not sent a prescription in at this time but will be glad to.  Which pharmacy would you like it to be sent to. ?You will be due for blood work in mid June.  Is there a day you would like to be scheduled for that? ? ?RX sent into pharmacy of choice and lab ordered/scheduled 03/13/2022. ? ?

## 2021-12-13 ENCOUNTER — Inpatient Hospital Stay (HOSPITAL_COMMUNITY): Payer: BC Managed Care – PPO

## 2021-12-13 ENCOUNTER — Other Ambulatory Visit: Payer: Self-pay

## 2021-12-13 VITALS — BP 149/90 | HR 88 | Temp 97.8°F | Resp 18

## 2021-12-13 DIAGNOSIS — E538 Deficiency of other specified B group vitamins: Secondary | ICD-10-CM

## 2021-12-13 DIAGNOSIS — D508 Other iron deficiency anemias: Secondary | ICD-10-CM

## 2021-12-13 DIAGNOSIS — D509 Iron deficiency anemia, unspecified: Secondary | ICD-10-CM | POA: Diagnosis not present

## 2021-12-13 MED ORDER — ACETAMINOPHEN 325 MG PO TABS
650.0000 mg | ORAL_TABLET | Freq: Once | ORAL | Status: AC
  Administered 2021-12-13: 650 mg via ORAL
  Filled 2021-12-13: qty 2

## 2021-12-13 MED ORDER — SODIUM CHLORIDE 0.9 % IV SOLN: Freq: Once | INTRAVENOUS | Status: AC

## 2021-12-13 MED ORDER — CYANOCOBALAMIN 1000 MCG/ML IJ SOLN
1000.0000 ug | Freq: Once | INTRAMUSCULAR | Status: AC
  Administered 2021-12-13: 1000 ug via INTRAMUSCULAR
  Filled 2021-12-13: qty 1

## 2021-12-13 MED ORDER — LORATADINE 10 MG PO TABS
10.0000 mg | ORAL_TABLET | Freq: Once | ORAL | Status: AC
  Administered 2021-12-13: 10 mg via ORAL
  Filled 2021-12-13: qty 1

## 2021-12-13 MED ORDER — SODIUM CHLORIDE 0.9 % IV SOLN
300.0000 mg | Freq: Once | INTRAVENOUS | Status: AC
  Administered 2021-12-13: 300 mg via INTRAVENOUS
  Filled 2021-12-13: qty 300

## 2021-12-13 NOTE — Progress Notes (Signed)
Patient presents today for Venofer infusion and B12 injection per providers order.  Vital signs WNL.  Patient has no new complaints at this time. ? ?Peripheral IV started and blood return noted pre and post infusion. ? ?Venofer infusion given today per MD orders.  Stable during infusion without adverse affects.  Vital signs stable.  No complaints at this time.  Discharge from clinic ambulatory in stable condition.  Alert and oriented X 3.  Follow up with [facility] as scheduled.  ?

## 2021-12-13 NOTE — Patient Instructions (Signed)
[facility]  Discharge Instructions: Thank you for choosing [facility] to provide your oncology and hematology care.  If you have a lab appointment with the Cancer Center, please come in thru the Main Entrance and check in at the main information desk.  Wear comfortable clothing and clothing appropriate for easy access to any Portacath or PICC line.   We strive to give you quality time with your provider. You may need to reschedule your appointment if you arrive late (15 or more minutes).  Arriving late affects you and other patients whose appointments are after yours.  Also, if you miss three or more appointments without notifying the office, you may be dismissed from the clinic at the provider's discretion.      For prescription refill requests, have your pharmacy contact our office and allow 72 hours for refills to be completed.    Today you received the following chemotherapy and/or immunotherapy agents Venofer      To help prevent nausea and vomiting after your treatment, we encourage you to take your nausea medication as directed.  BELOW ARE SYMPTOMS THAT SHOULD BE REPORTED IMMEDIATELY: *FEVER GREATER THAN 100.4 F (38 C) OR HIGHER *CHILLS OR SWEATING *NAUSEA AND VOMITING THAT IS NOT CONTROLLED WITH YOUR NAUSEA MEDICATION *UNUSUAL SHORTNESS OF BREATH *UNUSUAL BRUISING OR BLEEDING *URINARY PROBLEMS (pain or burning when urinating, or frequent urination) *BOWEL PROBLEMS (unusual diarrhea, constipation, pain near the anus) TENDERNESS IN MOUTH AND THROAT WITH OR WITHOUT PRESENCE OF ULCERS (sore throat, sores in mouth, or a toothache) UNUSUAL RASH, SWELLING OR PAIN  UNUSUAL VAGINAL DISCHARGE OR ITCHING   Items with * indicate a potential emergency and should be followed up as soon as possible or go to the Emergency Department if any problems should occur.  Please show the CHEMOTHERAPY ALERT CARD or IMMUNOTHERAPY ALERT CARD at check-in to the Emergency  Department and triage nurse.  Should you have questions after your visit or need to cancel or reschedule your appointment, please contact  CANCER CENTER [phone]  and follow the prompts.  Office hours are 8:00 a.m. to 4:30 p.m. Monday - Friday. Please note that voicemails left after 4:00 p.m. may not be returned until the following business day.  We are closed weekends and major holidays. You have access to a nurse at all times for urgent questions. Please call the main number to the clinic [phone] and follow the prompts.  For any non-urgent questions, you may also contact your provider using MyChart. We now offer e-Visits for anyone 18 and older to request care online for non-urgent symptoms. For details visit [email or web address].   Also download the MyChart app! Go to the app store, search "MyChart", open the app, select [facility], and log in with your MyChart username and password.  Due to Covid, a mask is required upon entering the hospital/clinic. If you do not have a mask, one will be given to you upon arrival. For doctor visits, patients may have 1 support person aged 18 or older with them. For treatment visits, patients cannot have anyone with them due to current Covid guidelines and our immunocompromised population.  

## 2021-12-16 ENCOUNTER — Ambulatory Visit (HOSPITAL_COMMUNITY): Payer: BC Managed Care – PPO

## 2021-12-16 ENCOUNTER — Encounter (HOSPITAL_COMMUNITY): Payer: Self-pay

## 2022-01-02 ENCOUNTER — Telehealth: Payer: Self-pay | Admitting: Cardiology

## 2022-01-02 ENCOUNTER — Encounter: Payer: Self-pay | Admitting: Cardiology

## 2022-01-02 MED ORDER — METOPROLOL SUCCINATE ER 25 MG PO TB24: 25.0000 mg | ORAL_TABLET | Freq: Every day | ORAL | 3 refills | Status: DC

## 2022-01-02 NOTE — Telephone Encounter (Signed)
Patient returned call for test results.  If she can't be reached by phone, you can email her results.  ?

## 2022-01-02 NOTE — Telephone Encounter (Signed)
?   Sinus rhythm with occasional episodes of atrial tachycardia.  ?? 1 episode of ventricular tachycardia 10 beats, nonsustained  ?? Frequent premature atrial contractions/PACs, rare PVCs  ?? Echocardiogram showed normal overall function  ?? Based upon these findings, lets trial Toprol-XL 25 mg once a day for ectopy suppression.  ?   ?Candee Furbish, MD  ? ?Called patient with results. Sent new medication to patient's pharmacy. Patient verbalized understanding. ?

## 2022-01-13 ENCOUNTER — Encounter (HOSPITAL_COMMUNITY): Payer: Self-pay

## 2022-01-13 ENCOUNTER — Inpatient Hospital Stay (HOSPITAL_COMMUNITY): Payer: BC Managed Care – PPO | Attending: Hematology

## 2022-01-31 ENCOUNTER — Encounter (HOSPITAL_COMMUNITY): Payer: Self-pay

## 2022-01-31 ENCOUNTER — Inpatient Hospital Stay (HOSPITAL_COMMUNITY): Payer: BC Managed Care – PPO | Attending: Hematology

## 2022-02-10 ENCOUNTER — Ambulatory Visit (HOSPITAL_COMMUNITY): Payer: BC Managed Care – PPO

## 2022-03-10 ENCOUNTER — Inpatient Hospital Stay (HOSPITAL_COMMUNITY): Payer: BC Managed Care – PPO

## 2022-03-10 ENCOUNTER — Inpatient Hospital Stay (HOSPITAL_COMMUNITY): Payer: BC Managed Care – PPO | Attending: Hematology

## 2022-03-10 VITALS — BP 146/89 | HR 72 | Temp 98.1°F | Resp 18

## 2022-03-10 DIAGNOSIS — E538 Deficiency of other specified B group vitamins: Secondary | ICD-10-CM | POA: Diagnosis present

## 2022-03-10 DIAGNOSIS — E559 Vitamin D deficiency, unspecified: Secondary | ICD-10-CM

## 2022-03-10 DIAGNOSIS — D509 Iron deficiency anemia, unspecified: Secondary | ICD-10-CM | POA: Diagnosis present

## 2022-03-10 DIAGNOSIS — D508 Other iron deficiency anemias: Secondary | ICD-10-CM

## 2022-03-10 LAB — CBC WITH DIFFERENTIAL/PLATELET
Abs Immature Granulocytes: 0.03 10*3/uL (ref 0.00–0.07)
Basophils Absolute: 0.1 10*3/uL (ref 0.0–0.1)
Basophils Relative: 1 %
Eosinophils Absolute: 0.2 10*3/uL (ref 0.0–0.5)
Eosinophils Relative: 2 %
HCT: 44 % (ref 36.0–46.0)
Hemoglobin: 14.4 g/dL (ref 12.0–15.0)
Immature Granulocytes: 0 %
Lymphocytes Relative: 26 %
Lymphs Abs: 1.8 10*3/uL (ref 0.7–4.0)
MCH: 31.4 pg (ref 26.0–34.0)
MCHC: 32.7 g/dL (ref 30.0–36.0)
MCV: 96.1 fL (ref 80.0–100.0)
Monocytes Absolute: 0.4 10*3/uL (ref 0.1–1.0)
Monocytes Relative: 6 %
Neutro Abs: 4.5 10*3/uL (ref 1.7–7.7)
Neutrophils Relative %: 65 %
Platelets: 177 10*3/uL (ref 150–400)
RBC: 4.58 MIL/uL (ref 3.87–5.11)
RDW: 14.3 % (ref 11.5–15.5)
WBC: 6.9 10*3/uL (ref 4.0–10.5)
nRBC: 0 % (ref 0.0–0.2)

## 2022-03-10 LAB — IRON AND TIBC
Iron: 113 ug/dL (ref 28–170)
Saturation Ratios: 35 % — ABNORMAL HIGH (ref 10.4–31.8)
TIBC: 323 ug/dL (ref 250–450)
UIBC: 210 ug/dL

## 2022-03-10 LAB — VITAMIN D 25 HYDROXY (VIT D DEFICIENCY, FRACTURES): Vit D, 25-Hydroxy: 32.84 ng/mL (ref 30–100)

## 2022-03-10 LAB — FERRITIN: Ferritin: 131 ng/mL (ref 11–307)

## 2022-03-10 LAB — VITAMIN B12: Vitamin B-12: 115 pg/mL — ABNORMAL LOW (ref 180–914)

## 2022-03-10 MED ORDER — CYANOCOBALAMIN 1000 MCG/ML IJ SOLN
1000.0000 ug | Freq: Once | INTRAMUSCULAR | Status: AC
  Administered 2022-03-10: 1000 ug via INTRAMUSCULAR
  Filled 2022-03-10: qty 1

## 2022-03-10 NOTE — Progress Notes (Signed)
Patient tolerated Vitamin B 12 injection with no complaints voiced.  Site clean and dry with no bruising or swelling noted.  No complaints of pain.  Discharged with vital signs stable and no signs or symptoms of distress noted.   ?

## 2022-03-10 NOTE — Patient Instructions (Signed)
[place]  Discharge Instructions: Thank you for choosing [facility] to provide your oncology and hematology care.  If you have a lab appointment with the [facility], please come in thru the Main Entrance and check in at the main information desk.  Wear comfortable clothing and clothing appropriate for easy access to any Portacath or PICC line.   We strive to give you quality time with your provider. You may need to reschedule your appointment if you arrive late (15 or more minutes).  Arriving late affects you and other patients whose appointments are after yours.  Also, if you miss three or more appointments without notifying the office, you may be dismissed from the clinic at the provider's discretion.      For prescription refill requests, have your pharmacy contact our office and allow 72 hours for refills to be completed.    Vitamin B12 Deficiency Vitamin B12 deficiency occurs when the body does not have enough of this important vitamin. The body needs this vitamin: To make red blood cells. To make DNA. This is the genetic material inside cells. To help the nerves work properly so they can carry messages from the brain to the body. Vitamin B12 deficiency can cause health problems, such as not having enough red blood cells in the blood (anemia). This can lead to nerve damage if untreated. What are the causes? This condition may be caused by: Not eating enough foods that contain vitamin B12. Not having enough stomach acid and digestive fluids to properly absorb vitamin B12 from the food that you eat. Having certain diseases that make it hard to absorb vitamin B12. These diseases include Crohn's disease, chronic pancreatitis, and cystic fibrosis. An autoimmune disorder in which the body does not make enough of a protein (intrinsic factor) within the stomach, resulting in not enough absorption of vitamin B12. Having a surgery in which part of the stomach or  small intestine is removed. Taking certain medicines that make it hard for the body to absorb vitamin B12. These include: Heartburn medicines, such as antacids and proton pump inhibitors. Some medicines that are used to treat diabetes. What increases the risk? The following factors may make you more likely to develop a vitamin B12 deficiency: Being an older adult. Eating a vegetarian or vegan diet that does not include any foods that come from animals. Eating a poor diet while you are pregnant. Taking certain medicines. Having alcoholism. What are the signs or symptoms? In some cases, there are no symptoms of this condition. If the condition leads to anemia or nerve damage, various symptoms may occur, such as: Weakness. Tiredness (fatigue). Loss of appetite. Numbness or tingling in your hands and feet. Redness and burning of the tongue. Depression, confusion, or memory problems. Trouble walking. If anemia is severe, symptoms can include: Shortness of breath. Dizziness. Rapid heart rate. How is this diagnosed? This condition may be diagnosed with a blood test to measure the level of vitamin B12 in your blood. You may also have other tests, including: A group of tests that measure certain characteristics of blood cells (complete blood count, CBC). A blood test to measure intrinsic factor. A procedure where a thin tube with a camera on the end is used to look into your stomach or intestines (endoscopy). Other tests may be needed to discover the cause of the deficiency. How is this treated? Treatment for this condition depends on the cause. This condition may be treated by: Changing your eating  and drinking habits, such as: Eating more foods that contain vitamin B12. Drinking less alcohol or no alcohol. Getting vitamin B12 injections. Taking vitamin B12 supplements by mouth (orally). Your health care provider will tell you which dose is best for you. Follow these instructions at  home: Eating and drinking  Include foods in your diet that come from animals and contain a lot of vitamin B12. These include: Meats and poultry. This includes beef, pork, chicken, [place], and organ meats, such as liver. Seafood. This includes clams, rainbow trout, salmon, tuna, and haddock. Eggs. Dairy foods such as milk, yogurt, and cheese. Eat foods that have vitamin B12 added to them (are fortified), such as ready-to-eat breakfast cereals. Check the label on the package to see if a food is fortified. The items listed above may not be a complete list of foods and beverages you can eat and drink. Contact a dietitian for more information. Alcohol use Do not drink alcohol if: Your health care provider tells you not to drink. You are pregnant, may be pregnant, or are planning to become pregnant. If you drink alcohol: Limit how much you have to: 0-1 drink a day for women. 0-2 drinks a day for men. Know how much alcohol is in your drink. In the [place] one drink equals one 12 oz bottle of beer (355 mL), one 5 oz glass of wine (148 mL), or one 1 oz glass of hard liquor (44 mL). General instructions Get vitamin B12 injections if told to by your health care provider. Take supplements only as told by your health care provider. Follow the directions carefully. Keep all follow-up visits. This is important. Contact a health care provider if: Your symptoms come back. Your symptoms get worse or do not improve with treatment. Get help right away: You develop shortness of breath. You have a rapid heart rate. You have chest pain. You become dizzy or you faint. These symptoms may be an emergency. Get help right away. Call 911. Do not wait to see if the symptoms will go away. Do not drive yourself to the hospital. Summary Vitamin B12 deficiency occurs when the body does not have enough of this important vitamin. Common causes include not eating enough foods that contain vitamin B12, not being able  to absorb vitamin B12 from the food that you eat, having a surgery in which part of the stomach or small intestine is removed, or taking certain medicines. Eat foods that have vitamin B12 in them. Treatment may include making a change in the way you eat and drink, getting vitamin B12 injections, or taking vitamin B12 supplements. This information is not intended to replace advice given to you by your health care provider. Make sure you discuss any questions you have with your health care provider. Document Revised: 05/13/2021 Document Reviewed: 05/13/2021 Elsevier Patient Education  [place].    To help prevent nausea and vomiting after your treatment, we encourage you to take your nausea medication as directed.  BELOW ARE SYMPTOMS THAT SHOULD BE REPORTED IMMEDIATELY: *FEVER GREATER THAN 100.4 F (38 C) OR HIGHER *CHILLS OR SWEATING *NAUSEA AND VOMITING THAT IS NOT CONTROLLED WITH YOUR NAUSEA MEDICATION *UNUSUAL SHORTNESS OF BREATH *UNUSUAL BRUISING OR BLEEDING *URINARY PROBLEMS (pain or burning when urinating, or frequent urination) *BOWEL PROBLEMS (unusual diarrhea, constipation, pain near the anus) TENDERNESS IN MOUTH AND THROAT WITH OR WITHOUT PRESENCE OF ULCERS (sore throat, sores in mouth, or a toothache) UNUSUAL RASH, SWELLING OR PAIN  UNUSUAL VAGINAL DISCHARGE OR ITCHING  Items with * indicate a potential emergency and should be followed up as soon as possible or go to the Emergency Department if any problems should occur.  Please show the CHEMOTHERAPY ALERT CARD or IMMUNOTHERAPY ALERT CARD at check-in to the Emergency Department and triage nurse.  Should you have questions after your visit or need to cancel or reschedule your appointment, please contact [facility] [phone]  and follow the prompts.  Office hours are 8:00 a.m. to 4:30 p.m. Monday - Friday. Please note that voicemails left after 4:00 p.m. may not be returned until the following business  day.  We are closed weekends and major holidays. You have access to a nurse at all times for urgent questions. Please call the main number to the clinic [phone] and follow the prompts.  For any non-urgent questions, you may also contact your provider using MyChart. We now offer e-Visits for anyone 34 and older to request care online for non-urgent symptoms. For details visit [email or web address].   Also download the MyChart app! Go to the app store, search "MyChart", open the app, select [facility], and log in with your MyChart username and password.  Due to Covid, a mask is required upon entering the hospital/clinic. If you do not have a mask, one will be given to you upon arrival. For doctor visits, patients may have 1 support person aged 63 or older with them. For treatment visits, patients cannot have anyone with them due to current Covid guidelines and our immunocompromised population.

## 2022-03-13 ENCOUNTER — Other Ambulatory Visit: Payer: BC Managed Care – PPO

## 2022-03-14 LAB — METHYLMALONIC ACID, SERUM: Methylmalonic Acid, Quantitative: 758 nmol/L — ABNORMAL HIGH (ref 0–378)

## 2022-03-16 NOTE — Progress Notes (Deleted)
NO SHOW

## 2022-03-17 ENCOUNTER — Other Ambulatory Visit: Payer: BC Managed Care – PPO

## 2022-03-17 ENCOUNTER — Ambulatory Visit (HOSPITAL_COMMUNITY): Payer: BC Managed Care – PPO | Admitting: Physician Assistant

## 2022-06-14 ENCOUNTER — Other Ambulatory Visit: Payer: Self-pay

## 2022-06-14 DIAGNOSIS — E538 Deficiency of other specified B group vitamins: Secondary | ICD-10-CM

## 2022-06-14 DIAGNOSIS — D508 Other iron deficiency anemias: Secondary | ICD-10-CM

## 2022-06-14 DIAGNOSIS — E559 Vitamin D deficiency, unspecified: Secondary | ICD-10-CM

## 2022-06-15 ENCOUNTER — Inpatient Hospital Stay: Payer: BC Managed Care – PPO | Attending: Hematology | Admitting: Hematology

## 2022-06-15 DIAGNOSIS — D509 Iron deficiency anemia, unspecified: Secondary | ICD-10-CM | POA: Diagnosis present

## 2022-06-15 DIAGNOSIS — E559 Vitamin D deficiency, unspecified: Secondary | ICD-10-CM

## 2022-06-15 DIAGNOSIS — E538 Deficiency of other specified B group vitamins: Secondary | ICD-10-CM | POA: Insufficient documentation

## 2022-06-15 DIAGNOSIS — D508 Other iron deficiency anemias: Secondary | ICD-10-CM

## 2022-06-15 LAB — CBC WITH DIFFERENTIAL/PLATELET
Abs Immature Granulocytes: 0.02 10*3/uL (ref 0.00–0.07)
Basophils Absolute: 0.1 10*3/uL (ref 0.0–0.1)
Basophils Relative: 1 %
Eosinophils Absolute: 0.2 10*3/uL (ref 0.0–0.5)
Eosinophils Relative: 2 %
HCT: 43.7 % (ref 36.0–46.0)
Hemoglobin: 14.3 g/dL (ref 12.0–15.0)
Immature Granulocytes: 0 %
Lymphocytes Relative: 31 %
Lymphs Abs: 2.3 10*3/uL (ref 0.7–4.0)
MCH: 31.1 pg (ref 26.0–34.0)
MCHC: 32.7 g/dL (ref 30.0–36.0)
MCV: 95 fL (ref 80.0–100.0)
Monocytes Absolute: 0.7 10*3/uL (ref 0.1–1.0)
Monocytes Relative: 9 %
Neutro Abs: 4.3 10*3/uL (ref 1.7–7.7)
Neutrophils Relative %: 57 %
Platelets: 185 10*3/uL (ref 150–400)
RBC: 4.6 MIL/uL (ref 3.87–5.11)
RDW: 14 % (ref 11.5–15.5)
WBC: 7.5 10*3/uL (ref 4.0–10.5)
nRBC: 0 % (ref 0.0–0.2)

## 2022-06-15 LAB — FERRITIN: Ferritin: 183 ng/mL (ref 11–307)

## 2022-06-15 LAB — IRON AND TIBC
Iron: 98 ug/dL (ref 28–170)
Saturation Ratios: 28 % (ref 10.4–31.8)
TIBC: 352 ug/dL (ref 250–450)
UIBC: 254 ug/dL

## 2022-06-15 LAB — VITAMIN B12: Vitamin B-12: 102 pg/mL — ABNORMAL LOW (ref 180–914)

## 2022-06-15 LAB — VITAMIN D 25 HYDROXY (VIT D DEFICIENCY, FRACTURES): Vit D, 25-Hydroxy: 42.76 ng/mL (ref 30–100)

## 2022-06-19 LAB — METHYLMALONIC ACID, SERUM: Methylmalonic Acid, Quantitative: 797 nmol/L — ABNORMAL HIGH (ref 0–378)

## 2022-06-21 NOTE — Progress Notes (Deleted)
Jody Stokes, [place] [address]   CLINIC:  Medical Oncology/Hematology  PCP:  Denny Levy, [place] [address] [phone]   REASON FOR VISIT:  Follow-up for iron deficiency anemia and B12 deficiency  CURRENT THERAPY: Monthly B12 injections, intermittent iron infusions  INTERVAL HISTORY:  Jody Stokes 61 y.o. female returns for routine follow-up of iron deficiency anemia and B12 deficiency.  She was last seen by Tarri Abernethy PA-C on 11/16/2021.  Patient's iron deficiency and B12 deficiency are thought to be from her gastric bypass surgery.    At today's visit, she reports feeling fair.  No recent hospitalizations, surgeries, or changes in baseline health status.  *** She missed her B12 injections in April and May. *** Improvement in energy after IV iron and B12 injections)?? *** Other symptoms ((headaches, palpitations, dyspnea on exertion, and lightheadedness)) *** She denies any bright red blood per rectum, melena, or epistaxis. *** She denies any pica, restless legs, chest pain, or syncope.    She has *** energy and  ***% appetite. She endorses that she is maintaining a stable weight.    REVIEW OF SYSTEMS: *** Review of Systems - Oncology    PAST MEDICAL/SURGICAL HISTORY:  Past Medical History:  Diagnosis Date   Anemia    B12 deficiency 03/14/2016   Congenital heart disease    Depression    Fibromyalgia    Gastric bypass status for obesity 03/14/2016   Iron deficiency anemia 03/14/2016   Presence of IVC filter    Pulmonary embolism (HCC)    Rheumatoid arthritis (HCC)    Rheumatoid   Sleep apnea    uses CPAP   Tubular adenoma of colon    Past Surgical History:  Procedure Laterality Date   BIOPSY N/A 11/19/2014   Procedure: BIOPSY;  Surgeon: Daneil Dolin, MD;  Location: [facility];  Service: Endoscopy;  Laterality: N/A;  Ascending Colon   BIOPSY  09/22/2016   Procedure: BIOPSY;  Surgeon: Rogene Houston,  MD;  Location: [facility];  Service: Endoscopy;;  small bowel   BIOPSY  03/08/2018   Procedure: BIOPSY;  Surgeon: Rogene Houston, MD;  Location: [facility];  Service: Endoscopy;;  colon   CARPAL TUNNEL RELEASE Bilateral    CHOLECYSTECTOMY  2009   COLONOSCOPY  07/2012   transverse colon 5 mm polyp removed   COLONOSCOPY WITH PROPOFOL N/A 11/19/2014   RMR: mutilple colonic polyps removed as described above. Pancolonic diverticulosis(few).  Status post mucosal biopsy to assess for microscopic coliits. /    COLONOSCOPY WITH PROPOFOL N/A 03/08/2018   Procedure: COLONOSCOPY WITH PROPOFOL;  Surgeon: Rogene Houston, MD;  Location: [facility];  Service: Endoscopy;  Laterality: N/A;   ESOPHAGOGASTRODUODENOSCOPY N/A 09/22/2016   Procedure: ESOPHAGOGASTRODUODENOSCOPY (EGD);  Surgeon: Rogene Houston, MD;  Location: [facility];  Service: Endoscopy;  Laterality: N/A;  1:15   ESOPHAGOGASTRODUODENOSCOPY (EGD) WITH PROPOFOL N/A 09/05/2019   Procedure: ESOPHAGOGASTRODUODENOSCOPY (EGD) WITH PROPOFOL;  Surgeon: Rogene Houston, MD;  Location: [facility];  Service: Endoscopy;  Laterality: N/A;  1005am   FOOT SURGERY  2015   left and right   GASTRIC BYPASS  2008   HERNIA REPAIR     HIATAL HERNIA REPAIR  2007   POLYPECTOMY N/A 11/19/2014   Procedure: POLYPECTOMY;  Surgeon: Daneil Dolin, MD;  Location: [facility];  Service: Endoscopy;  Laterality: N/A;  ileocecal valve, Hepatic Flexure, Splenic Flexure, Descending Colon  TUBAL LIGATION       SOCIAL HISTORY:  Social History   Socioeconomic History   Marital status: Married    Spouse name: Not on file   Number of children: 3   Years of education: Not on file   Highest education level: Not on file  Occupational History   Not on file  Tobacco Use   Smoking status: Never   Smokeless tobacco: Never  Vaping Use   Vaping Use: Never used  Substance and Sexual Activity   Alcohol use: No    Alcohol/week: 0.0 standard drinks of alcohol   Drug  use: No   Sexual activity: Yes    Birth control/protection: Surgical    Comment: tubal  Other Topics Concern   Not on file  Social History Narrative   Lives with husband.     Social Determinants of Health   Financial Resource Strain: Not on file  Food Insecurity: Not on file  Transportation Needs: Not on file  Physical Activity: Not on file  Stress: Not on file  Social Connections: Not on file  Intimate Partner Violence: Not on file    FAMILY HISTORY:  Family History  Problem Relation Age of Onset   HIV Sister    Hypertension Brother    Diabetes Brother    Kidney disease Brother    Hypertension Brother    Heart Problems Brother        No details.    Hypertension Brother    Colon cancer Neg Hx    Breast cancer Neg Hx     CURRENT MEDICATIONS:  Outpatient Encounter Medications as of 06/22/2022  Medication Sig   acetaminophen (TYLENOL) 650 MG CR tablet    aspirin-acetaminophen-caffeine (EXCEDRIN MIGRAINE) 250-250-65 MG tablet    cyanocobalamin (,VITAMIN B-12,) 1000 MCG/ML injection 100 mcg.   diphenhydramine-acetaminophen (TYLENOL PM) 25-500 MG TABS tablet Take by mouth.   DULoxetine (CYMBALTA) 60 MG capsule 1 capsule   folic acid (FOLVITE) 1 MG tablet Take 1 mg by mouth daily.   losartan (COZAAR) 25 MG tablet Take 25 mg by mouth daily.   meclizine (ANTIVERT) 12.5 MG tablet Take 12.5 mg by mouth every 4 (four) hours as needed.   metoprolol succinate (TOPROL XL) 25 MG 24 hr tablet Take 1 tablet (25 mg total) by mouth daily.   ondansetron (ZOFRAN-ODT) 8 MG disintegrating tablet Take 8 mg by mouth daily as needed.   OTREXUP 12.5 MG/0.4ML SOAJ Inject into the skin.   promethazine (PHENERGAN) 25 MG tablet Take 25 mg by mouth 3 (three) times daily as needed.   rosuvastatin (CRESTOR) 10 MG tablet Take 1 tablet (10 mg total) by mouth daily.   Vitamin D, Ergocalciferol, (DRISDOL) 1.25 MG (50000 UNIT) CAPS capsule Take 1 capsule (50,000 Units total) by mouth every 7 (seven) days.    No facility-administered encounter medications on file as of 06/22/2022.    ALLERGIES:  No Active Allergies   PHYSICAL EXAM: *** ECOG PERFORMANCE STATUS: {CHL ONC ECOG PS:[id number]}  There were no vitals filed for this visit. There were no vitals filed for this visit. Physical Exam   LABORATORY DATA:  I have reviewed the labs as listed.  CBC    Component Value Date/Time   WBC 7.5 06/15/2022 1536   RBC 4.60 06/15/2022 1536   HGB 14.3 06/15/2022 1536   HCT 43.7 06/15/2022 1536   PLT 185 06/15/2022 1536   MCV 95.0 06/15/2022 1536   MCH 31.1 06/15/2022 1536   MCHC 32.7 06/15/2022 1536  RDW 14.0 06/15/2022 1536   LYMPHSABS 2.3 06/15/2022 1536   MONOABS 0.7 06/15/2022 1536   EOSABS 0.2 06/15/2022 1536   BASOSABS 0.1 06/15/2022 1536      Latest Ref Rng & Units 11/10/2021    3:07 PM 10/17/2019   10:18 AM 07/08/2019   12:05 PM  CMP  Glucose 70 - 99 mg/dL 68  99  84   BUN 6 - 20 mg/dL '19  16  12   '$ Creatinine 0.44 - 1.00 mg/dL 0.88  0.66  0.74   Sodium 135 - 145 mmol/L 132  140  142   Potassium 3.5 - 5.1 mmol/L 4.0  4.2  4.4   Chloride 98 - 111 mmol/L 106  110  104   CO2 22 - 32 mmol/L '23  24  27   '$ Calcium 8.9 - 10.3 mg/dL 8.4  8.6  9.0   Total Protein 6.5 - 8.1 g/dL 7.2  7.1  6.8   Total Bilirubin 0.3 - 1.2 mg/dL 0.3  0.5  0.7   Alkaline Phos 38 - 126 U/L 91  114    AST 15 - 41 U/L 40  28  23   ALT 0 - 44 U/L 49  29  27     DIAGNOSTIC IMAGING:  I have independently reviewed the relevant imaging and discussed with the patient.  ASSESSMENT & PLAN: 1.  Iron deficiency anemia - Source of iron deficiency is likely malabsorption from prior gastric bypass surgery - Last Feraheme infusion was Venofer 300 mg x 3 from 11/24/2021 through 12/13/2021  - No signs or symptoms of blood loss ***  - Symptomatic with severe fatigue, headaches, and dyspnea on exertion ***  - Most recent labs (06/15/2022): Hgb 14.3/MCV 95.0, ferritin 183, iron saturation 28% - PLAN: No indication for  IV iron at this time - Repeat labs and RTC in 6 months, PHONE visit  2.  Vitamin B12 deficiency - Source of vitamin B12 deficiency is likely malabsorption from prior gastric bypass surgery - She was previously receiving monthly B12 injections, but was lost to follow-up after March 2021 - We have restarted her on monthly B12 injections in February 2023, although she missed most of her injection appointments, and has only received vitamin B12 injections x3 since February 2023 - Most recent labs (06/15/2022): Low vitamin B12 at 102, elevated methylmalonic acid 797. - PLAN: We will give additional vitamin B12 injection today and repeat weekly x4 before restarting monthly B12 injections. - Patient counseled on the importance of not missing these injections. - Repeat B12, methylmalonic acid, and folate and RTC in 6 months with PHONE visit  3.  Vitamin D deficiency - Prior vitamin D (11/10/2021): Low at 14.21 - She started taking vitamin D 50,000 units weekly in February 2023 *** - Most recent labs (06/15/2022): Normal vitamin D at 42.76 - PLAN: Continue taking vitamin D 50,000 units weekly.  Repeat labs and RTC in 4 months.   PLAN SUMMARY & DISPOSITION: ***  All questions were answered. The patient knows to call the clinic with any problems, questions or concerns.  Medical decision making: ***  Time spent on visit: I spent *** minutes counseling the patient face to face. The total time spent in the appointment was *** minutes and more than 50% was on counseling.   Harriett Rush, PA-C  ***

## 2022-06-22 ENCOUNTER — Inpatient Hospital Stay: Payer: BC Managed Care – PPO | Admitting: Physician Assistant

## 2022-07-07 ENCOUNTER — Ambulatory Visit: Payer: BC Managed Care – PPO | Admitting: Physician Assistant

## 2022-07-13 ENCOUNTER — Inpatient Hospital Stay: Payer: BC Managed Care – PPO | Admitting: Physician Assistant

## 2022-07-28 ENCOUNTER — Other Ambulatory Visit (HOSPITAL_COMMUNITY)
Admission: RE | Admit: 2022-07-28 | Discharge: 2022-07-28 | Disposition: A | Discharge status: Home / Self Care | Payer: BC Managed Care – PPO | Source: Ambulatory Visit | Attending: Rheumatology | Admitting: Rheumatology

## 2022-07-28 DIAGNOSIS — M069 Rheumatoid arthritis, unspecified: Secondary | ICD-10-CM | POA: Diagnosis present

## 2022-07-28 DIAGNOSIS — Z79899 Other long term (current) drug therapy: Secondary | ICD-10-CM | POA: Insufficient documentation

## 2022-07-28 LAB — HEPATIC FUNCTION PANEL
ALT: 50 U/L — ABNORMAL HIGH (ref 0–44)
AST: 33 U/L (ref 15–41)
Albumin: 3.8 g/dL (ref 3.5–5.0)
Alkaline Phosphatase: 94 U/L (ref 38–126)
Bilirubin, Direct: 0.1 mg/dL (ref 0.0–0.2)
Indirect Bilirubin: 0.4 mg/dL (ref 0.3–0.9)
Total Bilirubin: 0.5 mg/dL (ref 0.3–1.2)
Total Protein: 7.4 g/dL (ref 6.5–8.1)

## 2022-07-28 LAB — CBC
HCT: 45.3 % (ref 36.0–46.0)
Hemoglobin: 14.6 g/dL (ref 12.0–15.0)
MCH: 30.7 pg (ref 26.0–34.0)
MCHC: 32.2 g/dL (ref 30.0–36.0)
MCV: 95.2 fL (ref 80.0–100.0)
Platelets: 195 10*3/uL (ref 150–400)
RBC: 4.76 MIL/uL (ref 3.87–5.11)
RDW: 14.4 % (ref 11.5–15.5)
WBC: 6.9 10*3/uL (ref 4.0–10.5)
nRBC: 0 % (ref 0.0–0.2)

## 2022-07-28 LAB — CREATININE, SERUM
Creatinine, Ser: 1.02 mg/dL — ABNORMAL HIGH (ref 0.44–1.00)
GFR, Estimated: >60 mL/min (ref >60)

## 2022-08-02 NOTE — Progress Notes (Unsigned)
[place], [place] [address]   CLINIC:  Medical Oncology/Hematology  PCP:  Denny Levy, [place] [address] [phone]   REASON FOR VISIT:  Follow-up for iron deficiency anemia and B12 deficiency   CURRENT THERAPY: Monthly B12 injections, intermittent iron infusions   INTERVAL HISTORY:  Jody Stokes 61 y.o. female returns for routine follow-up of iron deficiency anemia and B12 deficiency, which is thought to be secondary to gastric bypass surgery.  She was last seen by Tarri Abernethy PA-C on 11/16/2021.  She unfortunately missed her follow-up appointment and missed most of her monthly B12 injections as well.  At today's visit, she reports feeling fair.  ***  No recent hospitalizations, surgeries, or changes in baseline health status.   *** Improvement in energy after IV iron and B12 injections)?? *** Other symptoms ((headaches, palpitations, dyspnea on exertion, and lightheadedness)) *** She denies any bright red blood per rectum, melena, or epistaxis. *** She denies any pica, restless legs, chest pain, or syncope.     She has *** energy and  ***% appetite. She endorses that she is maintaining a stable weight.   REVIEW OF SYSTEMS:*** Review of Systems  Constitutional:  Positive for fatigue. Negative for appetite change, chills, diaphoresis, fever and unexpected weight change.  HENT:   Negative for lump/mass and nosebleeds.   Eyes:  Negative for eye problems.  Respiratory:  Positive for shortness of breath (with exertion). Negative for cough and hemoptysis.   Cardiovascular:  Positive for palpitations. Negative for chest pain and leg swelling.  Gastrointestinal:  Positive for nausea. Negative for abdominal pain, blood in stool, constipation, diarrhea and vomiting.  Genitourinary:  Negative for hematuria.   Musculoskeletal:  Positive for arthralgias.  Skin: Negative.   Neurological:  Positive for dizziness, headaches,  light-headedness and numbness.  Hematological:  Does not bruise/bleed easily.  Psychiatric/Behavioral:  Positive for depression and sleep disturbance.       PAST MEDICAL/SURGICAL HISTORY:  Past Medical History:  Diagnosis Date   Anemia    B12 deficiency 03/14/2016   Congenital heart disease    Depression    Fibromyalgia    Gastric bypass status for obesity 03/14/2016   Iron deficiency anemia 03/14/2016   Presence of IVC filter    Pulmonary embolism (HCC)    Rheumatoid arthritis (HCC)    Rheumatoid   Sleep apnea    uses CPAP   Tubular adenoma of colon    Past Surgical History:  Procedure Laterality Date   BIOPSY N/A 11/19/2014   Procedure: BIOPSY;  Surgeon: Daneil Dolin, MD;  Location: [facility];  Service: Endoscopy;  Laterality: N/A;  Ascending Colon   BIOPSY  09/22/2016   Procedure: BIOPSY;  Surgeon: Rogene Houston, MD;  [facility];  Service: Endoscopy;;  small bowel   BIOPSY  03/08/2018   Procedure: BIOPSY;  Surgeon: Rogene Houston, MD;  [facility];  Service: Endoscopy;;  colon   CARPAL TUNNEL RELEASE Bilateral    CHOLECYSTECTOMY  2009   COLONOSCOPY  07/2012   transverse colon 5 mm polyp removed   COLONOSCOPY WITH PROPOFOL N/A 11/19/2014   RMR: mutilple colonic polyps removed as described above. Pancolonic diverticulosis(few).  Status post mucosal biopsy to assess for microscopic coliits. /    COLONOSCOPY WITH PROPOFOL N/A 03/08/2018   Procedure: COLONOSCOPY WITH PROPOFOL;  Surgeon: Rogene Houston, MD;  [facility];  Service: Endoscopy;  Laterality: N/A;  ESOPHAGOGASTRODUODENOSCOPY N/A 09/22/2016   Procedure: ESOPHAGOGASTRODUODENOSCOPY (EGD);  Surgeon: Rogene Houston, MD;  [facility];  Service: Endoscopy;  Laterality: N/A;  1:15   ESOPHAGOGASTRODUODENOSCOPY (EGD) WITH PROPOFOL N/A 09/05/2019   Procedure: ESOPHAGOGASTRODUODENOSCOPY (EGD) WITH PROPOFOL;  Surgeon: Rogene Houston, MD;  [facility];  Service:  Endoscopy;  Laterality: N/A;  1005am   FOOT SURGERY  2015   left and right   GASTRIC BYPASS  2008   HERNIA REPAIR     HIATAL HERNIA REPAIR  2007   POLYPECTOMY N/A 11/19/2014   Procedure: POLYPECTOMY;  Surgeon: Daneil Dolin, MD;  Location: [facility];  Service: Endoscopy;  Laterality: N/A;  ileocecal valve, Hepatic Flexure, Splenic Flexure, Descending Colon    TUBAL LIGATION       SOCIAL HISTORY:  Social History   Socioeconomic History   Marital status: Married    Spouse name: Not on file   Number of children: 3   Years of education: Not on file   Highest education level: Not on file  Occupational History   Not on file  Tobacco Use   Smoking status: Never   Smokeless tobacco: Never  Vaping Use   Vaping Use: Never used  Substance and Sexual Activity   Alcohol use: No    Alcohol/week: 0.0 standard drinks of alcohol   Drug use: No   Sexual activity: Yes    Birth control/protection: Surgical    Comment: tubal  Other Topics Concern   Not on file  Social History Narrative   Lives with husband.     Social Determinants of Health   Financial Resource Strain: Not on file  Food Insecurity: Not on file  Transportation Needs: Not on file  Physical Activity: Not on file  Stress: Not on file  Social Connections: Not on file  Intimate Partner Violence: Not on file    FAMILY HISTORY:  Family History  Problem Relation Age of Onset   HIV Sister    Hypertension Brother    Diabetes Brother    Kidney disease Brother    Hypertension Brother    Heart Problems Brother        No details.    Hypertension Brother    Colon cancer Neg Hx    Breast cancer Neg Hx     CURRENT MEDICATIONS:  Outpatient Encounter Medications as of 08/03/2022  Medication Sig   acetaminophen (TYLENOL) 650 MG CR tablet    aspirin-acetaminophen-caffeine (EXCEDRIN MIGRAINE) 250-250-65 MG tablet    cyanocobalamin (,VITAMIN B-12,) 1000 MCG/ML injection 100 mcg.   diphenhydramine-acetaminophen (TYLENOL PM)  25-500 MG TABS tablet Take by mouth.   DULoxetine (CYMBALTA) 60 MG capsule 1 capsule   folic acid (FOLVITE) 1 MG tablet Take 1 mg by mouth daily.   losartan (COZAAR) 25 MG tablet Take 25 mg by mouth daily.   meclizine (ANTIVERT) 12.5 MG tablet Take 12.5 mg by mouth every 4 (four) hours as needed.   metoprolol succinate (TOPROL XL) 25 MG 24 hr tablet Take 1 tablet (25 mg total) by mouth daily.   ondansetron (ZOFRAN-ODT) 8 MG disintegrating tablet Take 8 mg by mouth daily as needed.   OTREXUP 12.5 MG/0.4ML SOAJ Inject into the skin.   promethazine (PHENERGAN) 25 MG tablet Take 25 mg by mouth 3 (three) times daily as needed.   rosuvastatin (CRESTOR) 10 MG tablet Take 1 tablet (10 mg total) by mouth daily.   Vitamin D, Ergocalciferol, (DRISDOL) 1.25 MG (50000 UNIT) CAPS capsule Take 1 capsule (  50,000 Units total) by mouth every 7 (seven) days.   No facility-administered encounter medications on file as of 08/03/2022.    ALLERGIES:  No Active Allergies    PHYSICAL EXAM:*** ECOG PERFORMANCE STATUS: 1 - Symptomatic but completely ambulatory  There were no vitals filed for this visit. There were no vitals filed for this visit. Physical Exam Constitutional:      Appearance: Normal appearance. She is obese.  HENT:     Head: Normocephalic and atraumatic.     Mouth/Throat:     Mouth: Mucous membranes are moist.  Eyes:     Extraocular Movements: Extraocular movements intact.     Pupils: Pupils are equal, round, and reactive to light.  Cardiovascular:     Rate and Rhythm: Normal rate and regular rhythm.     Pulses: Normal pulses.     Heart sounds: Normal heart sounds.  Pulmonary:     Effort: Pulmonary effort is normal.     Breath sounds: Normal breath sounds.  Abdominal:     General: Bowel sounds are normal.     Palpations: Abdomen is soft.     Tenderness: There is no abdominal tenderness.  Musculoskeletal:        General: No swelling.     Right lower leg: No edema.     Left lower  leg: No edema.  Lymphadenopathy:     Cervical: No cervical adenopathy.  Skin:    General: Skin is warm and dry.  Neurological:     General: No focal deficit present.     Mental Status: She is alert and oriented to person, place, and time.  Psychiatric:        Mood and Affect: Mood normal.        Behavior: Behavior normal.      LABORATORY DATA:  I have reviewed the labs as listed.  CBC    Component Value Date/Time   WBC 6.9 07/28/2022 1310   RBC 4.76 07/28/2022 1310   HGB 14.6 07/28/2022 1310   HCT 45.3 07/28/2022 1310   PLT 195 07/28/2022 1310   MCV 95.2 07/28/2022 1310   MCH 30.7 07/28/2022 1310   MCHC 32.2 07/28/2022 1310   RDW 14.4 07/28/2022 1310   LYMPHSABS 2.3 06/15/2022 1536   MONOABS 0.7 06/15/2022 1536   EOSABS 0.2 06/15/2022 1536   BASOSABS 0.1 06/15/2022 1536      Latest Ref Rng & Units 07/28/2022    1:10 PM 11/10/2021    3:07 PM 10/17/2019   10:18 AM  CMP  Glucose 70 - 99 mg/dL  68  99   BUN 6 - 20 mg/dL  19  16   Creatinine 0.44 - 1.00 mg/dL 1.02  0.88  0.66   Sodium 135 - 145 mmol/L  132  140   Potassium 3.5 - 5.1 mmol/L  4.0  4.2   Chloride 98 - 111 mmol/L  106  110   CO2 22 - 32 mmol/L  23  24   Calcium 8.9 - 10.3 mg/dL  8.4  8.6   Total Protein 6.5 - 8.1 g/dL 7.4  7.2  7.1   Total Bilirubin 0.3 - 1.2 mg/dL 0.5  0.3  0.5   Alkaline Phos 38 - 126 U/L 94  91  114   AST 15 - 41 U/L 33  40  28   ALT 0 - 44 U/L 50  49  29     DIAGNOSTIC IMAGING:  I have independently reviewed the relevant imaging and discussed  with the patient.  ASSESSMENT & PLAN: 1.  Iron deficiency anemia - Source of iron deficiency is likely malabsorption from prior gastric bypass surgery - Last Feraheme infusion was Venofer 300 mg x 3 from 11/24/2021 through 12/13/2021  - No signs or symptoms of blood loss ***  - Symptomatic with severe fatigue, headaches, and dyspnea on exertion ***  - Most recent labs (06/15/2022): Hgb 14.3/MCV 95.0, ferritin 183, iron saturation 28% - PLAN:  No indication for IV iron at this time - Repeat labs and RTC in 6 months, PHONE visit   2.  Vitamin B12 deficiency - Source of vitamin B12 deficiency is likely malabsorption from prior gastric bypass surgery - She was previously receiving monthly B12 injections, but was lost to follow-up after March 2021 - We have restarted her on monthly B12 injections in February 2023, although she missed most of her injection appointments, and has only received vitamin B12 injections x3 since February 2023 - Most recent labs (06/15/2022): Low vitamin B12 at 102, elevated methylmalonic acid 797. - PLAN: We will give additional vitamin B12 injection today and repeat weekly x4 before restarting monthly B12 injections. - Patient counseled on the importance of not missing these injections. - Repeat B12, methylmalonic acid, and folate and RTC in 6 months with PHONE visit   3.  Vitamin D deficiency - Prior vitamin D (11/10/2021): Low at 14.21 - She started taking vitamin D 50,000 units weekly in February 2023 *** - Most recent labs (06/15/2022): Normal vitamin D at 42.76 - PLAN: Continue taking vitamin D 50,000 units weekly.  Recheck vitamin D annually (next due September 2024)   PLAN SUMMARY & DISPOSITION: Weekly B12 x4 followed by monthly B12 injections Labs in 6 months PHONE visit after labs ***  All questions were answered. The patient knows to call the clinic with any problems, questions or concerns.  Medical decision making: Moderate***  Time spent on visit: I spent *** minutes counseling the patient face to face. The total time spent in the appointment was *** minutes and more than 50% was on counseling.   Harriett Rush, PA-C  ***

## 2022-08-03 ENCOUNTER — Inpatient Hospital Stay: Payer: BC Managed Care – PPO | Attending: Hematology | Admitting: Physician Assistant

## 2022-08-03 ENCOUNTER — Inpatient Hospital Stay: Payer: BC Managed Care – PPO

## 2022-08-03 VITALS — BP 144/67 | HR 89 | Temp 99.0°F | Resp 18 | Ht 66.0 in | Wt 295.6 lb

## 2022-08-03 DIAGNOSIS — Z79899 Other long term (current) drug therapy: Secondary | ICD-10-CM | POA: Insufficient documentation

## 2022-08-03 DIAGNOSIS — R002 Palpitations: Secondary | ICD-10-CM | POA: Insufficient documentation

## 2022-08-03 DIAGNOSIS — D508 Other iron deficiency anemias: Secondary | ICD-10-CM | POA: Diagnosis not present

## 2022-08-03 DIAGNOSIS — E559 Vitamin D deficiency, unspecified: Secondary | ICD-10-CM | POA: Insufficient documentation

## 2022-08-03 DIAGNOSIS — R519 Headache, unspecified: Secondary | ICD-10-CM | POA: Diagnosis not present

## 2022-08-03 DIAGNOSIS — D509 Iron deficiency anemia, unspecified: Secondary | ICD-10-CM | POA: Insufficient documentation

## 2022-08-03 DIAGNOSIS — R0609 Other forms of dyspnea: Secondary | ICD-10-CM | POA: Insufficient documentation

## 2022-08-03 DIAGNOSIS — Z9884 Bariatric surgery status: Secondary | ICD-10-CM | POA: Insufficient documentation

## 2022-08-03 DIAGNOSIS — E538 Deficiency of other specified B group vitamins: Secondary | ICD-10-CM

## 2022-08-03 MED ORDER — CYANOCOBALAMIN 1000 MCG/ML IJ SOLN
1000.0000 ug | Freq: Once | INTRAMUSCULAR | Status: AC
  Administered 2022-08-03: 1000 ug via INTRAMUSCULAR
  Filled 2022-08-03: qty 1

## 2022-08-03 NOTE — Patient Instructions (Signed)
[facility]  Discharge Instructions: Thank you for choosing [facility] to provide your oncology and hematology care.  If you have a lab appointment with the Cancer Center, please come in thru the Main Entrance and check in at the main information desk.  Wear comfortable clothing and clothing appropriate for easy access to any Portacath or PICC line.   We strive to give you quality time with your provider. You may need to reschedule your appointment if you arrive late (15 or more minutes).  Arriving late affects you and other patients whose appointments are after yours.  Also, if you miss three or more appointments without notifying the office, you may be dismissed from the clinic at the provider's discretion.      For prescription refill requests, have your pharmacy contact our office and allow 72 hours for refills to be completed.     To help prevent nausea and vomiting after your treatment, we encourage you to take your nausea medication as directed.  BELOW ARE SYMPTOMS THAT SHOULD BE REPORTED IMMEDIATELY: *FEVER GREATER THAN 100.4 F (38 C) OR HIGHER *CHILLS OR SWEATING *NAUSEA AND VOMITING THAT IS NOT CONTROLLED WITH YOUR NAUSEA MEDICATION *UNUSUAL SHORTNESS OF BREATH *UNUSUAL BRUISING OR BLEEDING *URINARY PROBLEMS (pain or burning when urinating, or frequent urination) *BOWEL PROBLEMS (unusual diarrhea, constipation, pain near the anus) TENDERNESS IN MOUTH AND THROAT WITH OR WITHOUT PRESENCE OF ULCERS (sore throat, sores in mouth, or a toothache) UNUSUAL RASH, SWELLING OR PAIN  UNUSUAL VAGINAL DISCHARGE OR ITCHING   Items with * indicate a potential emergency and should be followed up as soon as possible or go to the Emergency Department if any problems should occur.  Please show the CHEMOTHERAPY ALERT CARD or IMMUNOTHERAPY ALERT CARD at check-in to the Emergency Department and triage nurse.  Should you have questions after your visit or need to  cancel or reschedule your appointment, please contact [facility] [phone]  and follow the prompts.  Office hours are 8:00 a.m. to 4:30 p.m. Monday - Friday. Please note that voicemails left after 4:00 p.m. may not be returned until the following business day.  We are closed weekends and major holidays. You have access to a nurse at all times for urgent questions. Please call the main number to the clinic [phone] and follow the prompts.  For any non-urgent questions, you may also contact your provider using MyChart. We now offer e-Visits for anyone 18 and older to request care online for non-urgent symptoms. For details visit [email or web address].   Also download the MyChart app! Go to the app store, search "MyChart", open the app, select [facility], and log in with your MyChart username and password.  Masks are optional in the cancer centers. If you would like for your care team to wear a mask while they are taking care of you, please let them know. You may have one support person who is at least 60 years old accompany you for your appointments.  

## 2022-08-03 NOTE — Patient Instructions (Signed)
[facility] Discharge Instructions  You were seen today by Tarri Abernethy PA-C for your vitamin and mineral deficiencies which are related to your history of gastric bypass surgery.  Your iron and blood levels look great right now!  You do not need any IV iron.  Your vitamin D levels look great right now.  Continue taking your vitamin D 50,000 units once a week.  Your vitamin B12 levels are very low.  We will schedule you for vitamin B12 injections once a week for the next 4 weeks, followed by monthly B12 injections.  LABS: Return in 6 months for repeat labs  FOLLOW-UP APPOINTMENT: Phone visit after labs  ** Thank you for trusting me with your healthcare!  I strive to provide all of my patients with quality care at each visit.  If you receive a survey for this visit, I would be so grateful to you for taking the time to provide feedback.  Thank you in advance!  ~ Lockie Bothun                   Dr. Derek Jack   &   Tarri Abernethy, PA-C   - - - - - - - - - - - - - - - - - -     Thank you for choosing [facility] to provide your oncology and hematology care.  To afford each patient quality time with our provider, please arrive at least 15 minutes before your scheduled appointment time.   If you have a lab appointment with the [facility] please come in thru the Main Entrance and check in at the main information desk.  You need to re-schedule your appointment should you arrive 10 or more minutes late.  We strive to give you quality time with our providers, and arriving late affects you and other patients whose appointments are after yours.  Also, if you no show three or more times for appointments you may be dismissed from the clinic at the providers discretion.     Again, thank you for choosing [facility].  Our hope is that these requests will decrease the amount of time that you wait before  being seen by our physicians.       _____________________________________________________________  Should you have questions after your visit to [facility], please contact our office at [phone] and follow the prompts.  Our office hours are 8:00 a.m. and 4:30 p.m. Monday - Friday.  Please note that voicemails left after 4:00 p.m. may not be returned until the following business day.  We are closed weekends and major holidays.  You do have access to a nurse 24-7, just call the main number to the clinic [phone] and do not press any options, hold on the line and a nurse will answer the phone.    For prescription refill requests, have your pharmacy contact our office and allow 72 hours.    Due to Covid, you will need to wear a mask upon entering the hospital. If you do not have a mask, a mask will be given to you at the Main Entrance upon arrival. For doctor visits, patients may have 1 support person age 55 or older with them. For treatment visits, patients can not have anyone with them due to social distancing guidelines and our immunocompromised population.

## 2022-08-03 NOTE — Progress Notes (Signed)
Patient tolerated injection with no complaints voiced.  Site clean and dry with no bruising or swelling noted at site.  See MAR for details.  Band aid applied.  Patient stable during and after injection.  Vss with discharge and left in satisfactory condition with no s/s of distress noted.  

## 2022-08-07 ENCOUNTER — Ambulatory Visit (INDEPENDENT_AMBULATORY_CARE_PROVIDER_SITE_OTHER): Payer: BC Managed Care – PPO

## 2022-08-07 ENCOUNTER — Ambulatory Visit: Payer: BC Managed Care – PPO | Admitting: Podiatry

## 2022-08-07 DIAGNOSIS — M79671 Pain in right foot: Secondary | ICD-10-CM | POA: Diagnosis not present

## 2022-08-07 DIAGNOSIS — M2141 Flat foot [pes planus] (acquired), right foot: Secondary | ICD-10-CM

## 2022-08-07 DIAGNOSIS — M2142 Flat foot [pes planus] (acquired), left foot: Secondary | ICD-10-CM

## 2022-08-07 DIAGNOSIS — M722 Plantar fascial fibromatosis: Secondary | ICD-10-CM | POA: Diagnosis not present

## 2022-08-07 DIAGNOSIS — M79672 Pain in left foot: Secondary | ICD-10-CM

## 2022-08-07 MED ORDER — TRIAMCINOLONE ACETONIDE 10 MG/ML IJ SUSP
10.0000 mg | Freq: Once | INTRAMUSCULAR | Status: AC
  Administered 2022-08-07: 10 mg

## 2022-08-07 NOTE — Progress Notes (Unsigned)
Subjective:   Patient ID: Jody Stokes, female   DOB: 61 y.o.   MRN: [id number]   HPI Chief Complaint  Patient presents with   Plantar Fasciitis    Bilateral foot pain heel, started month ago, rate of pain 8 out of 10,  more pain in the morning and after sitting for a long time, X-rays taken today,    61 y.o. female presents with the above concerns. She gets pain more on the right than the left. It will take a few seconds for hte pain to ease up. It is worse after sitting or sleeping but she gets pain throughout the day. It is in the heel. Toes go numb at times. No injuries.    Review of Systems  All other systems reviewed and are negative.   Past Medical History:  Diagnosis Date   Anemia    B12 deficiency 03/14/2016   Congenital heart disease    Depression    Fibromyalgia    Gastric bypass status for obesity 03/14/2016   Iron deficiency anemia 03/14/2016   Presence of IVC filter    Pulmonary embolism (HCC)    Rheumatoid arthritis (HCC)    Rheumatoid   Sleep apnea    uses CPAP   Tubular adenoma of colon     Past Surgical History:  Procedure Laterality Date   BIOPSY N/A 11/19/2014   Procedure: BIOPSY;  Surgeon: Daneil Dolin, MD;  Location: [facility];  Service: Endoscopy;  Laterality: N/A;  Ascending Colon   BIOPSY  09/22/2016   Procedure: BIOPSY;  Surgeon: Rogene Houston, MD;  [facility];  Service: Endoscopy;;  small bowel   BIOPSY  03/08/2018   Procedure: BIOPSY;  Surgeon: Rogene Houston, MD;  [facility];  Service: Endoscopy;;  colon   CARPAL TUNNEL RELEASE Bilateral    CHOLECYSTECTOMY  2009   COLONOSCOPY  07/2012   transverse colon 5 mm polyp removed   COLONOSCOPY WITH PROPOFOL N/A 11/19/2014   RMR: mutilple colonic polyps removed as described above. Pancolonic diverticulosis(few).  Status post mucosal biopsy to assess for microscopic coliits. /    COLONOSCOPY WITH PROPOFOL N/A 03/08/2018   Procedure: COLONOSCOPY WITH PROPOFOL;  Surgeon:  Rogene Houston, MD;  [facility];  Service: Endoscopy;  Laterality: N/A;   ESOPHAGOGASTRODUODENOSCOPY N/A 09/22/2016   Procedure: ESOPHAGOGASTRODUODENOSCOPY (EGD);  Surgeon: Rogene Houston, MD;  [facility];  Service: Endoscopy;  Laterality: N/A;  1:15   ESOPHAGOGASTRODUODENOSCOPY (EGD) WITH PROPOFOL N/A 09/05/2019   Procedure: ESOPHAGOGASTRODUODENOSCOPY (EGD) WITH PROPOFOL;  Surgeon: Rogene Houston, MD;  [facility];  Service: Endoscopy;  Laterality: N/A;  1005am   FOOT SURGERY  2015   left and right   GASTRIC BYPASS  2008   HERNIA REPAIR     HIATAL HERNIA REPAIR  2007   POLYPECTOMY N/A 11/19/2014   Procedure: POLYPECTOMY;  Surgeon: Daneil Dolin, MD;  Location: [facility];  Service: Endoscopy;  Laterality: N/A;  ileocecal valve, Hepatic Flexure, Splenic Flexure, Descending Colon    TUBAL LIGATION       Current Outpatient Medications:    aspirin-acetaminophen-caffeine (EXCEDRIN MIGRAINE) 250-250-65 MG tablet, , Disp: , Rfl:    cholestyramine (QUESTRAN) 4 g packet, 1 packet mixed with water or non-carbonated drink Orally Twice a day for 30 day(s), Disp: , Rfl:    cyanocobalamin (,VITAMIN B-12,) 1000 MCG/ML injection, 100 mcg., Disp: , Rfl:    cyclobenzaprine (FLEXERIL) 10 MG tablet, 1 tablet  at bedtime as needed Orally Once a day for 30 day(s), Disp: , Rfl:    diphenhydramine-acetaminophen (TYLENOL PM) 25-500 MG TABS tablet, Take by mouth., Disp: , Rfl:    DULoxetine (CYMBALTA) 60 MG capsule, 1 capsule, Disp: , Rfl:    folic acid (FOLVITE) 1 MG tablet, Take 1 mg by mouth daily., Disp: , Rfl:    losartan (COZAAR) 25 MG tablet, Take 25 mg by mouth daily., Disp: , Rfl:    meclizine (ANTIVERT) 12.5 MG tablet, Take 12.5 mg by mouth every 4 (four) hours as needed., Disp: , Rfl:    metoprolol succinate (TOPROL XL) 25 MG 24 hr tablet, Take 1 tablet (25 mg total) by mouth daily., Disp: 90 tablet, Rfl: 3   ondansetron (ZOFRAN-ODT) 8 MG disintegrating tablet, Take  8 mg by mouth daily as needed., Disp: , Rfl:    OTREXUP 12.5 MG/0.4ML SOAJ, Inject into the skin., Disp: , Rfl:    promethazine (PHENERGAN) 25 MG tablet, Take 25 mg by mouth 3 (three) times daily as needed., Disp: , Rfl:    rosuvastatin (CRESTOR) 10 MG tablet, Take 1 tablet (10 mg total) by mouth daily., Disp: 90 tablet, Rfl: 3   Vitamin D, Ergocalciferol, (DRISDOL) 1.25 MG (50000 UNIT) CAPS capsule, Take 1 capsule (50,000 Units total) by mouth every 7 (seven) days., Disp: 5 capsule, Rfl: 11  No Active Allergies       Objective:  Physical Exam  General: AAO x3, NAD  Dermatological: Skin is warm, dry and supple bilateral. Nails x 10 are well manicured; remaining integument appears unremarkable at this time. There are no open sores, no preulcerative lesions, no rash or signs of infection present.  Vascular: Dorsalis Pedis artery and Posterior Tibial artery pedal pulses are 2/4 bilateral with immedate capillary fill time. There is no pain with calf compression, swelling, warmth, erythema.   Neruologic: Grossly intact via light touch bilateral. Negative tinel sign.   Musculoskeletal: Tenderness to palpation along the plantar medial tubercle of the calcaneus at the insertion of plantar fascia on the right >> left foot. There is no pain along the course of the plantar fascia within the arch of the foot. Plantar fascia appears to be intact. There is no pain with lateral compression of the calcaneus or pain with vibratory sensation. There is no pain along the course or insertion of the achilles tendon. No other areas of tenderness to bilateral lower extremities.  Gait: Unassisted, Nonantalgic.       Assessment:   Bilateral heel pain, likley plantar fasciitis recurrence; flatfoot.      Plan:  -Treatment options discussed including all alternatives, risks, and complications -Etiology of symptoms were discussed -Steroid injection performed.  See procedure note below. -Discussed shoe  modifications and orthotics.  Proceed with new custom orthotics.  She was measured for this today. -Continue stretching, icing on a regular basis  Procedure: Injection Tendon/Ligament Discussed alternatives, risks, complications and verbal consent was obtained.  Location: Bilateral plantar fascia at the glabrous junction; medial approach. Skin Prep: Alcohol. Injectate: 0.5cc 0.5% marcaine plain, 0.5 cc 2% lidocaine plain and, 1 cc kenalog 10. Disposition: Patient tolerated procedure well. Injection site dressed with a band-aid.  Post-injection care was discussed and return precautions discussed.   Trula Slade DPM

## 2022-08-07 NOTE — Patient Instructions (Signed)

## 2022-08-10 ENCOUNTER — Inpatient Hospital Stay: Payer: BC Managed Care – PPO

## 2022-08-11 ENCOUNTER — Inpatient Hospital Stay: Payer: BC Managed Care – PPO

## 2022-08-11 VITALS — BP 139/83 | HR 87 | Temp 97.7°F | Resp 18

## 2022-08-11 DIAGNOSIS — E538 Deficiency of other specified B group vitamins: Secondary | ICD-10-CM | POA: Diagnosis not present

## 2022-08-11 MED ORDER — CYANOCOBALAMIN 1000 MCG/ML IJ SOLN
1000.0000 ug | Freq: Once | INTRAMUSCULAR | Status: AC
  Administered 2022-08-11: 1000 ug via INTRAMUSCULAR
  Filled 2022-08-11: qty 1

## 2022-08-11 NOTE — Patient Instructions (Signed)
[facility]  Discharge Instructions: Thank you for choosing [facility] to provide your oncology and hematology care.  If you have a lab appointment with the [facility], please come in thru the Main Entrance and check in at the main information desk.  Wear comfortable clothing and clothing appropriate for easy access to any Portacath or PICC line.   We strive to give you quality time with your provider. You may need to reschedule your appointment if you arrive late (15 or more minutes).  Arriving late affects you and other patients whose appointments are after yours.  Also, if you miss three or more appointments without notifying the office, you may be dismissed from the clinic at the provider's discretion.      For prescription refill requests, have your pharmacy contact our office and allow 72 hours for refills to be completed.    Today you received the following chemotherapy and/or immunotherapy agents B12 injection. Vitamin B12 Injection What is this medication? Vitamin B12 (VAHY tuh min B12) prevents and treats low vitamin B12 levels in your body. It is used in people who do not get enough vitamin B12 from their diet or when their digestive tract does not absorb enough. Vitamin B12 plays an important role in maintaining the health of your nervous system and red blood cells. This medicine may be used for other purposes; ask your health care provider or pharmacist if you have questions. COMMON BRAND NAME(S): B-12 Compliance Kit, B-12 Injection Kit, Cyomin, Dodex, LA-12, Nutri-Twelve, Physicians EZ Use B-12, Primabalt What should I tell my care team before I take this medication? They need to know if you have any of these conditions: Kidney disease Leber's disease Megaloblastic anemia An unusual or allergic reaction to cyanocobalamin, cobalt, other medications, foods, dyes, or preservatives Pregnant or trying to get pregnant Breast-feeding How should I  use this medication? This medication is injected into a muscle or deeply under the skin. It is usually given in a clinic or care team's office. However, your care team may teach you how to inject yourself. Follow all instructions. Talk to your care team about the use of this medication in children. Special care may be needed. Overdosage: If you think you have taken too much of this medicine contact a poison control center or emergency room at once. NOTE: This medicine is only for you. Do not share this medicine with others. What if I miss a dose? If you are given your dose at a clinic or care team's office, call to reschedule your appointment. If you give your own injections, and you miss a dose, take it as soon as you can. If it is almost time for your next dose, take only that dose. Do not take double or extra doses. What may interact with this medication? Alcohol Colchicine This list may not describe all possible interactions. Give your health care provider a list of all the medicines, herbs, non-prescription drugs, or dietary supplements you use. Also tell them if you smoke, drink alcohol, or use illegal drugs. Some items may interact with your medicine. What should I watch for while using this medication? Visit your care team regularly. You may need blood work done while you are taking this medication. You may need to follow a special diet. Talk to your care team. Limit your alcohol intake and avoid smoking to get the best benefit. What side effects may I notice from receiving this medication? Side effects that you should report  to your care team as soon as possible: Allergic reactions--skin rash, itching, hives, swelling of the face, lips, tongue, or throat Swelling of the ankles, hands, or feet Trouble breathing Side effects that usually do not require medical attention (report to your care team if they continue or are bothersome): Diarrhea This list may not describe all possible side  effects. Call your doctor for medical advice about side effects. You may report side effects to FDA at 1-800-FDA-1088. Where should I keep my medication? Keep out of the reach of children. Store at room temperature between 15 and 30 degrees C (59 and 85 degrees F). Protect from light. Throw away any unused medication after the expiration date. NOTE: This sheet is a summary. It may not cover all possible information. If you have questions about this medicine, talk to your doctor, pharmacist, or health care provider.  2023 Elsevier/Gold Standard (2007-11-09 00:00:00)       To help prevent nausea and vomiting after your treatment, we encourage you to take your nausea medication as directed.  BELOW ARE SYMPTOMS THAT SHOULD BE REPORTED IMMEDIATELY: *FEVER GREATER THAN 100.4 F (38 C) OR HIGHER *CHILLS OR SWEATING *NAUSEA AND VOMITING THAT IS NOT CONTROLLED WITH YOUR NAUSEA MEDICATION *UNUSUAL SHORTNESS OF BREATH *UNUSUAL BRUISING OR BLEEDING *URINARY PROBLEMS (pain or burning when urinating, or frequent urination) *BOWEL PROBLEMS (unusual diarrhea, constipation, pain near the anus) TENDERNESS IN MOUTH AND THROAT WITH OR WITHOUT PRESENCE OF ULCERS (sore throat, sores in mouth, or a toothache) UNUSUAL RASH, SWELLING OR PAIN  UNUSUAL VAGINAL DISCHARGE OR ITCHING   Items with * indicate a potential emergency and should be followed up as soon as possible or go to the Emergency Department if any problems should occur.  Please show the CHEMOTHERAPY ALERT CARD or IMMUNOTHERAPY ALERT CARD at check-in to the Emergency Department and triage nurse.  Should you have questions after your visit or need to cancel or reschedule your appointment, please contact [facility] [phone]  and follow the prompts.  Office hours are 8:00 a.m. to 4:30 p.m. Monday - Friday. Please note that voicemails left after 4:00 p.m. may not be returned until the following business day.  We are closed  weekends and major holidays. You have access to a nurse at all times for urgent questions. Please call the main number to the clinic [phone] and follow the prompts.  For any non-urgent questions, you may also contact your provider using MyChart. We now offer e-Visits for anyone 78 and older to request care online for non-urgent symptoms. For details visit [email or web address].   Also download the MyChart app! Go to the app store, search "MyChart", open the app, select [facility], and log in with your MyChart username and password.  Masks are optional in the cancer centers. If you would like for your care team to wear a mask while they are taking care of you, please let them know. You may have one support person who is at least 60 years old accompany you for your appointments.

## 2022-08-11 NOTE — Progress Notes (Signed)
Jody Stokes presents today for injection per the provider's orders.  B12 administration without incident; injection site WNL; see MAR for injection details.  Patient tolerated procedure well and without incident.  No questions or complaints noted at this time. Discharged from clinic ambulatory in stable condition. Alert and oriented x 3. F/U with [facility] as scheduled.

## 2022-08-17 ENCOUNTER — Inpatient Hospital Stay: Payer: BC Managed Care – PPO

## 2022-08-17 VITALS — BP 136/64 | HR 87 | Temp 98.1°F | Resp 20

## 2022-08-17 DIAGNOSIS — E538 Deficiency of other specified B group vitamins: Secondary | ICD-10-CM | POA: Diagnosis not present

## 2022-08-17 MED ORDER — CYANOCOBALAMIN 1000 MCG/ML IJ SOLN
1000.0000 ug | Freq: Once | INTRAMUSCULAR | Status: AC
  Administered 2022-08-17: 1000 ug via INTRAMUSCULAR
  Filled 2022-08-17: qty 1

## 2022-08-17 NOTE — Progress Notes (Signed)
Patient tolerated Vitamin B 12 injection with no complaints voiced.  Site clean and dry with no bruising or swelling noted.  No complaints of pain.  Discharged with vital signs stable and no signs or symptoms of distress noted.   ?

## 2022-08-17 NOTE — Patient Instructions (Signed)
[facility]  Discharge Instructions: Thank you for choosing [facility] to provide your oncology and hematology care.  If you have a lab appointment with the Cancer Center, please come in thru the Main Entrance and check in at the main information desk.  Wear comfortable clothing and clothing appropriate for easy access to any Portacath or PICC line.   We strive to give you quality time with your provider. You may need to reschedule your appointment if you arrive late (15 or more minutes).  Arriving late affects you and other patients whose appointments are after yours.  Also, if you miss three or more appointments without notifying the office, you may be dismissed from the clinic at the provider's discretion.      For prescription refill requests, have your pharmacy contact our office and allow 72 hours for refills to be completed.     To help prevent nausea and vomiting after your treatment, we encourage you to take your nausea medication as directed.  BELOW ARE SYMPTOMS THAT SHOULD BE REPORTED IMMEDIATELY: *FEVER GREATER THAN 100.4 F (38 C) OR HIGHER *CHILLS OR SWEATING *NAUSEA AND VOMITING THAT IS NOT CONTROLLED WITH YOUR NAUSEA MEDICATION *UNUSUAL SHORTNESS OF BREATH *UNUSUAL BRUISING OR BLEEDING *URINARY PROBLEMS (pain or burning when urinating, or frequent urination) *BOWEL PROBLEMS (unusual diarrhea, constipation, pain near the anus) TENDERNESS IN MOUTH AND THROAT WITH OR WITHOUT PRESENCE OF ULCERS (sore throat, sores in mouth, or a toothache) UNUSUAL RASH, SWELLING OR PAIN  UNUSUAL VAGINAL DISCHARGE OR ITCHING   Items with * indicate a potential emergency and should be followed up as soon as possible or go to the Emergency Department if any problems should occur.  Please show the CHEMOTHERAPY ALERT CARD or IMMUNOTHERAPY ALERT CARD at check-in to the Emergency Department and triage nurse.  Should you have questions after your visit or need to  cancel or reschedule your appointment, please contact [facility] [phone]  and follow the prompts.  Office hours are 8:00 a.m. to 4:30 p.m. Monday - Friday. Please note that voicemails left after 4:00 p.m. may not be returned until the following business day.  We are closed weekends and major holidays. You have access to a nurse at all times for urgent questions. Please call the main number to the clinic [phone] and follow the prompts.  For any non-urgent questions, you may also contact your provider using MyChart. We now offer e-Visits for anyone 18 and older to request care online for non-urgent symptoms. For details visit [email or web address].   Also download the MyChart app! Go to the app store, search "MyChart", open the app, select [facility], and log in with your MyChart username and password.  Masks are optional in the cancer centers. If you would like for your care team to wear a mask while they are taking care of you, please let them know. You may have one support person who is at least 60 years old accompany you for your appointments.  

## 2022-08-26 IMAGING — CT CT HEART MORP W/ CTA COR W/ SCORE W/ CA W/CM &/OR W/O CM
4 of 7 series · 8 of 20 positions shown, 9 images · non-contrast
Comparison: None.
COMPARISON: None.

Addendum:
EXAM:
OVER-READ INTERPRETATION  CT CHEST

The following report is an over-read performed by radiologist Dr.
over-read does not include interpretation of cardiac or coronary
anatomy or pathology. The coronary CTA interpretation by the
cardiologist is attached.
CLINICAL DATA: Chest pain
Cardiac CTA
MEDICATIONS:
Sub lingual nitro. 4mg x 2
TECHNIQUE: The patient was scanned on a Siemens [REDACTED]ice scanner. Gantry
rotation speed was 250 msecs. Collimation was 0.6 mm. A 100 kV
prospective scan was triggered in the ascending thoracic aorta at
35-75% of the R-R interval. Average HR during the scan was 60 bpm.
The 3D data set was interpreted on a dedicated work station using
MPR, MIP and VRT modes. A total of 80cc of contrast was used.

[Series 6: best diast 68 % · axial · 0.39mm/px · z∈[+1136,+1178]mm · 2 of 314 slices shown]
[im 105/314  vessel]
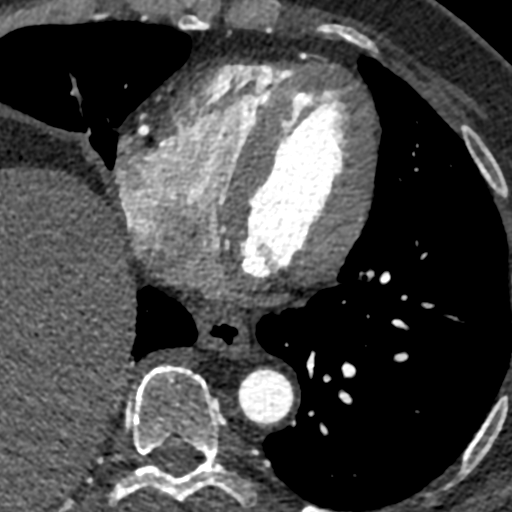
[im 209/314  vessel]
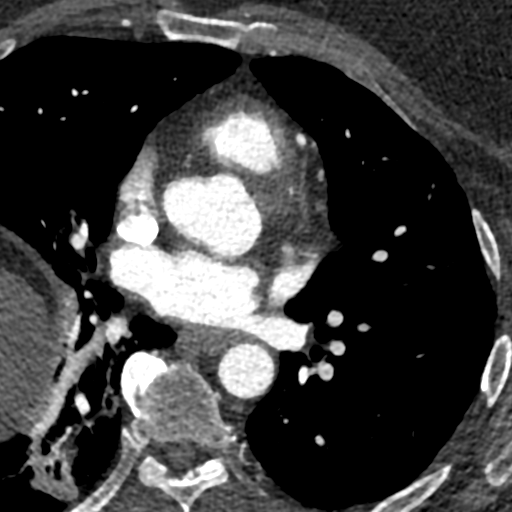

[Series 7: best syst · axial · 0.39mm/px · z∈[+1136,+1178]mm · 2 of 314 slices shown, 3 images]
[im 105/314  vessel]
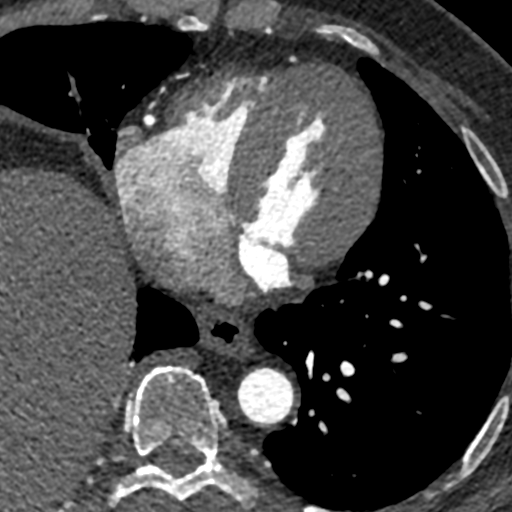
[im 105/314  lung]
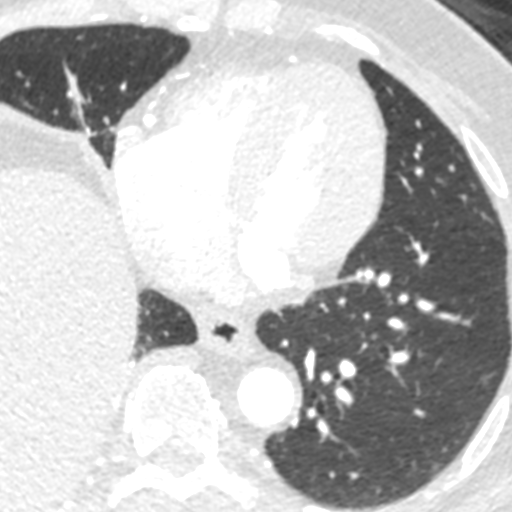
[im 209/314  vessel]
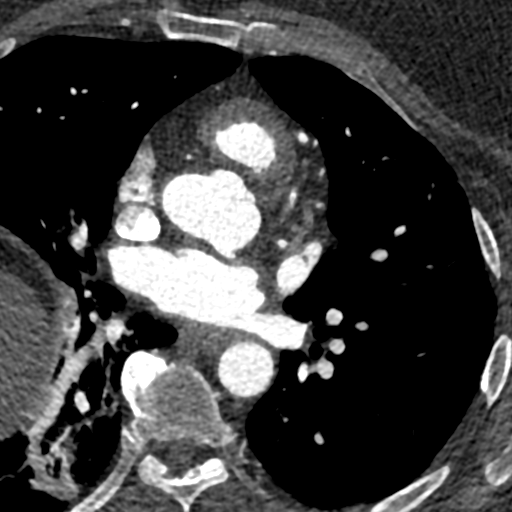

[Series 8: ts diast · axial · 0.39mm/px · z∈[+1136,+1178]mm · 2 of 314 slices shown]
[im 105/314  vessel]
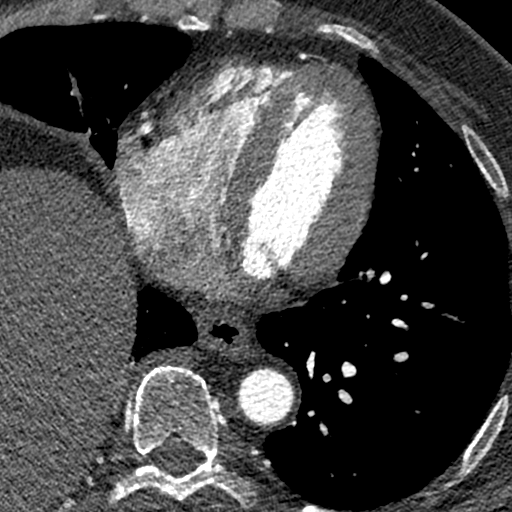
[im 209/314  vessel]
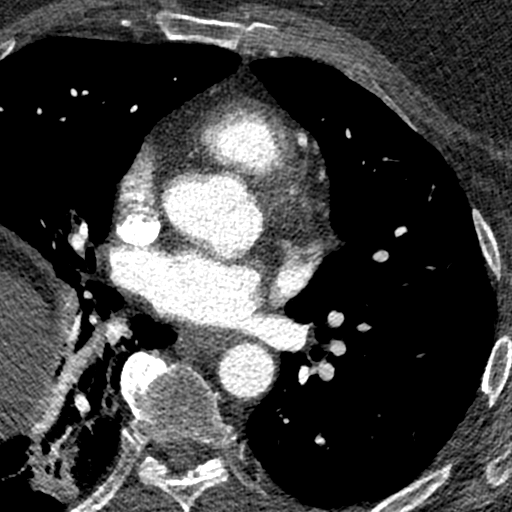

[Series 9: ts syst · axial · 0.39mm/px · z∈[+1136,+1178]mm · 2 of 314 slices shown]
[im 105/314  vessel]
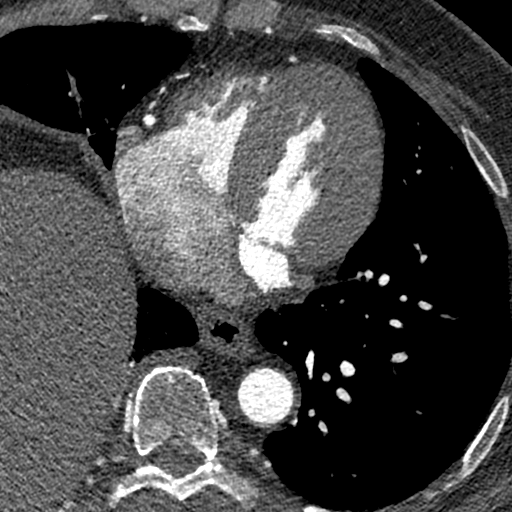
[im 209/314  vessel]
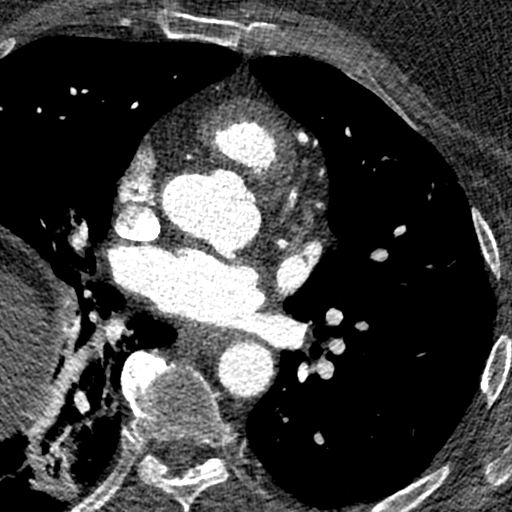

[8 of 20 positions shown; findings below may reference images not displayed]

FINDINGS: Vascular: No significant noncardiac vascular findings.

Mediastinum/Nodes: Visualized mediastinum and hilar regions
demonstrate no lymphadenopathy or masses.

Lungs/Pleura: There is elevation of the right hemidiaphragm.

Upper Abdomen: Right basilar atelectasis. Visualized lungs show no
evidence of pulmonary edema, consolidation, pneumothorax, nodule or
pleural fluid.

Musculoskeletal: No chest wall mass or suspicious bone lesions
identified.
IMPRESSION: Elevation of the right hemidiaphragm.
FINDINGS: Non-cardiac: See separate report from [REDACTED].

Pulmonary veins drain normally to the left atrium. No LA appendage
thrombus.

Calcium Score: 16.4 Agatston units.

Coronary Arteries: Right dominant with no anomalies

LM: No plaque or stenosis.

LAD system: No plaque or stenosis.

Circumflex system: No plaque or stenosis.

RCA system: Small area of calcified plaque proximal RCA, minimal
stenosis.
IMPRESSION: 1. Coronary artery calcium score 16.4 Agatston units. This places
the patient in the 77th percentile for age and gender, suggesting
intermediate risk for future cardiac events.

2.  Minimal coronary disease noted.

Giorgi Jumper

*** End of Addendum ***
EXAM:
OVER-READ INTERPRETATION  CT CHEST

The following report is an over-read performed by radiologist Dr.
over-read does not include interpretation of cardiac or coronary
anatomy or pathology. The coronary CTA interpretation by the
cardiologist is attached.
FINDINGS: Vascular: No significant noncardiac vascular findings.

Mediastinum/Nodes: Visualized mediastinum and hilar regions
demonstrate no lymphadenopathy or masses.

Lungs/Pleura: There is elevation of the right hemidiaphragm.

Upper Abdomen: Right basilar atelectasis. Visualized lungs show no
evidence of pulmonary edema, consolidation, pneumothorax, nodule or
pleural fluid.

Musculoskeletal: No chest wall mass or suspicious bone lesions
identified.
IMPRESSION: Elevation of the right hemidiaphragm.

## 2022-08-28 ENCOUNTER — Inpatient Hospital Stay: Payer: BC Managed Care – PPO

## 2022-08-28 ENCOUNTER — Other Ambulatory Visit: Payer: Self-pay | Admitting: Podiatry

## 2022-08-28 DIAGNOSIS — M2141 Flat foot [pes planus] (acquired), right foot: Secondary | ICD-10-CM

## 2022-08-28 DIAGNOSIS — M722 Plantar fascial fibromatosis: Secondary | ICD-10-CM

## 2022-08-28 DIAGNOSIS — M79671 Pain in right foot: Secondary | ICD-10-CM

## 2022-09-01 ENCOUNTER — Inpatient Hospital Stay: Payer: BC Managed Care – PPO | Attending: Hematology

## 2022-09-01 VITALS — BP 145/89 | HR 94 | Temp 97.4°F | Resp 18

## 2022-09-01 DIAGNOSIS — D509 Iron deficiency anemia, unspecified: Secondary | ICD-10-CM | POA: Insufficient documentation

## 2022-09-01 DIAGNOSIS — E538 Deficiency of other specified B group vitamins: Secondary | ICD-10-CM | POA: Diagnosis present

## 2022-09-01 MED ORDER — CYANOCOBALAMIN 1000 MCG/ML IJ SOLN
1000.0000 ug | Freq: Once | INTRAMUSCULAR | Status: AC
  Administered 2022-09-01: 1000 ug via INTRAMUSCULAR
  Filled 2022-09-01: qty 1

## 2022-09-01 NOTE — Progress Notes (Signed)
Jody Stokes presents today for injection per the provider's orders.  Blood pressure elevated on arrival. Patient takes blood pressure medication at night. Patient denies any headache , blurred vision, dizziness. Recheck blood pressure  145/89. B12 administration without incident; injection site WNL; see MAR for injection details.  Patient tolerated procedure well and without incident.  No questions or complaints noted at this time. Discharged from clinic ambulatory in stable condition. Alert and oriented x 3. F/U with [facility] as scheduled.

## 2022-09-01 NOTE — Patient Instructions (Signed)
[facility]  Discharge Instructions: Thank you for choosing [facility] to provide your oncology and hematology care.  If you have a lab appointment with the [facility], please come in thru the Main Entrance and check in at the main information desk.  Wear comfortable clothing and clothing appropriate for easy access to any Portacath or PICC line.   We strive to give you quality time with your provider. You may need to reschedule your appointment if you arrive late (15 or more minutes).  Arriving late affects you and other patients whose appointments are after yours.  Also, if you miss three or more appointments without notifying the office, you may be dismissed from the clinic at the provider's discretion.      For prescription refill requests, have your pharmacy contact our office and allow 72 hours for refills to be completed.    Today you received the following chemotherapy and/or immunotherapy agents B12.  Vitamin B12 Injection What is this medication? Vitamin B12 (VAHY tuh min B12) prevents and treats low vitamin B12 levels in your body. It is used in people who do not get enough vitamin B12 from their diet or when their digestive tract does not absorb enough. Vitamin B12 plays an important role in maintaining the health of your nervous system and red blood cells. This medicine may be used for other purposes; ask your health care provider or pharmacist if you have questions. COMMON BRAND NAME(S): B-12 Compliance Kit, B-12 Injection Kit, Cyomin, Dodex, LA-12, Nutri-Twelve, Physicians EZ Use B-12, Primabalt What should I tell my care team before I take this medication? They need to know if you have any of these conditions: Kidney disease Leber's disease Megaloblastic anemia An unusual or allergic reaction to cyanocobalamin, cobalt, other medications, foods, dyes, or preservatives Pregnant or trying to get pregnant Breast-feeding How should I use this  medication? This medication is injected into a muscle or deeply under the skin. It is usually given in a clinic or care team's office. However, your care team may teach you how to inject yourself. Follow all instructions. Talk to your care team about the use of this medication in children. Special care may be needed. Overdosage: If you think you have taken too much of this medicine contact a poison control center or emergency room at once. NOTE: This medicine is only for you. Do not share this medicine with others. What if I miss a dose? If you are given your dose at a clinic or care team's office, call to reschedule your appointment. If you give your own injections, and you miss a dose, take it as soon as you can. If it is almost time for your next dose, take only that dose. Do not take double or extra doses. What may interact with this medication? Alcohol Colchicine This list may not describe all possible interactions. Give your health care provider a list of all the medicines, herbs, non-prescription drugs, or dietary supplements you use. Also tell them if you smoke, drink alcohol, or use illegal drugs. Some items may interact with your medicine. What should I watch for while using this medication? Visit your care team regularly. You may need blood work done while you are taking this medication. You may need to follow a special diet. Talk to your care team. Limit your alcohol intake and avoid smoking to get the best benefit. What side effects may I notice from receiving this medication? Side effects that you should report  to your care team as soon as possible: Allergic reactions--skin rash, itching, hives, swelling of the face, lips, tongue, or throat Swelling of the ankles, hands, or feet Trouble breathing Side effects that usually do not require medical attention (report to your care team if they continue or are bothersome): Diarrhea This list may not describe all possible side effects.  Call your doctor for medical advice about side effects. You may report side effects to FDA at 1-800-FDA-1088. Where should I keep my medication? Keep out of the reach of children. Store at room temperature between 15 and 30 degrees C (59 and 85 degrees F). Protect from light. Throw away any unused medication after the expiration date. NOTE: This sheet is a summary. It may not cover all possible information. If you have questions about this medicine, talk to your doctor, pharmacist, or health care provider.  2023 Elsevier/Gold Standard (2007-11-09 00:00:00)       To help prevent nausea and vomiting after your treatment, we encourage you to take your nausea medication as directed.  BELOW ARE SYMPTOMS THAT SHOULD BE REPORTED IMMEDIATELY: *FEVER GREATER THAN 100.4 F (38 C) OR HIGHER *CHILLS OR SWEATING *NAUSEA AND VOMITING THAT IS NOT CONTROLLED WITH YOUR NAUSEA MEDICATION *UNUSUAL SHORTNESS OF BREATH *UNUSUAL BRUISING OR BLEEDING *URINARY PROBLEMS (pain or burning when urinating, or frequent urination) *BOWEL PROBLEMS (unusual diarrhea, constipation, pain near the anus) TENDERNESS IN MOUTH AND THROAT WITH OR WITHOUT PRESENCE OF ULCERS (sore throat, sores in mouth, or a toothache) UNUSUAL RASH, SWELLING OR PAIN  UNUSUAL VAGINAL DISCHARGE OR ITCHING   Items with * indicate a potential emergency and should be followed up as soon as possible or go to the Emergency Department if any problems should occur.  Please show the CHEMOTHERAPY ALERT CARD or IMMUNOTHERAPY ALERT CARD at check-in to the Emergency Department and triage nurse.  Should you have questions after your visit or need to cancel or reschedule your appointment, please contact [facility] [phone]  and follow the prompts.  Office hours are 8:00 a.m. to 4:30 p.m. Monday - Friday. Please note that voicemails left after 4:00 p.m. may not be returned until the following business day.  We are closed weekends and  major holidays. You have access to a nurse at all times for urgent questions. Please call the main number to the clinic [phone] and follow the prompts.  For any non-urgent questions, you may also contact your provider using MyChart. We now offer e-Visits for anyone 60 and older to request care online for non-urgent symptoms. For details visit [email or web address].   Also download the MyChart app! Go to the app store, search "MyChart", open the app, select [facility], and log in with your MyChart username and password.  Masks are optional in the cancer centers. If you would like for your care team to wear a mask while they are taking care of you, please let them know. You may have one support person who is at least 60 years old accompany you for your appointments.

## 2022-09-06 ENCOUNTER — Telehealth: Payer: Self-pay | Admitting: Podiatry

## 2022-09-06 NOTE — Telephone Encounter (Signed)
Patient is working up until the 21st of December leave appt where it is at.

## 2022-09-06 NOTE — Telephone Encounter (Signed)
Lmom to call back to schedule picking up orthotics   No balance 

## 2022-09-08 ENCOUNTER — Telehealth: Payer: Self-pay | Admitting: Cardiology

## 2022-09-08 DIAGNOSIS — Z79899 Other long term (current) drug therapy: Secondary | ICD-10-CM

## 2022-09-08 DIAGNOSIS — I2510 Atherosclerotic heart disease of native coronary artery without angina pectoris: Secondary | ICD-10-CM

## 2022-09-08 NOTE — Telephone Encounter (Signed)
Patient stated she would like to have new orders for Lipid and ALT labs as ordered by Dr. Marlou Porch earlier this year.  Patient would like to have the labs drawn at [facility].

## 2022-09-11 NOTE — Telephone Encounter (Signed)
Minimal coronary disease noted This degree of coronary artery disease would not result in symptoms. Recommend starting Crestor '10mg'$  PO QD for plaque stabilization. LDL goal less than 70. Check lipid panel in 3 months with ALT. Let's have her follow up with APP in 3 months after lipid check.  Candee Furbish, MD  Orders placed for Lipid and ALT.  Left message for pt of abov and to call back if any questions/concerns

## 2022-09-15 ENCOUNTER — Other Ambulatory Visit (HOSPITAL_COMMUNITY)
Admission: RE | Admit: 2022-09-15 | Discharge: 2022-09-15 | Disposition: A | Discharge status: Home / Self Care | Payer: BC Managed Care – PPO | Source: Ambulatory Visit | Attending: Cardiology | Admitting: Cardiology

## 2022-09-15 DIAGNOSIS — Z79899 Other long term (current) drug therapy: Secondary | ICD-10-CM

## 2022-09-15 DIAGNOSIS — I2510 Atherosclerotic heart disease of native coronary artery without angina pectoris: Secondary | ICD-10-CM | POA: Insufficient documentation

## 2022-09-15 LAB — LIPID PANEL
Cholesterol: 159 mg/dL (ref 0–200)
HDL: 67 mg/dL (ref >40)
LDL Cholesterol: 80 mg/dL (ref 0–99)
Total CHOL/HDL Ratio: 2.4 RATIO
Triglycerides: 62 mg/dL (ref <150)
VLDL: 12 mg/dL (ref 0–40)

## 2022-09-15 LAB — ALT: ALT: 43 U/L (ref 0–44)

## 2022-09-21 ENCOUNTER — Ambulatory Visit: Payer: BC Managed Care – PPO | Admitting: *Deleted

## 2022-09-21 DIAGNOSIS — M2141 Flat foot [pes planus] (acquired), right foot: Secondary | ICD-10-CM

## 2022-09-21 DIAGNOSIS — M722 Plantar fascial fibromatosis: Secondary | ICD-10-CM

## 2022-09-21 DIAGNOSIS — M79671 Pain in right foot: Secondary | ICD-10-CM

## 2022-09-21 NOTE — Progress Notes (Signed)
Patient presents today to pick up custom molded foot orthotics recommended by Dr. Jacqualyn Posey.   Orthotics were dispensed and fit was satisfactory. Reviewed instructions for break-in and wear. Written instructions given to patient.  Patient will follow up as needed.   [facility] - order # [id number]

## 2022-09-28 ENCOUNTER — Inpatient Hospital Stay: Payer: BC Managed Care – PPO

## 2022-10-03 ENCOUNTER — Telehealth: Payer: Self-pay

## 2022-10-03 NOTE — Telephone Encounter (Signed)
Left message for return call to discuss results.

## 2022-10-04 ENCOUNTER — Other Ambulatory Visit (HOSPITAL_COMMUNITY)
Admission: RE | Admit: 2022-10-04 | Discharge: 2022-10-04 | Disposition: A | Discharge status: Home / Self Care | Payer: BC Managed Care – PPO | Source: Ambulatory Visit | Attending: Pain Medicine | Admitting: Pain Medicine

## 2022-10-04 DIAGNOSIS — R739 Hyperglycemia, unspecified: Secondary | ICD-10-CM | POA: Diagnosis not present

## 2022-10-04 DIAGNOSIS — Z79899 Other long term (current) drug therapy: Secondary | ICD-10-CM | POA: Insufficient documentation

## 2022-10-04 LAB — CBC
HCT: 46.2 % — ABNORMAL HIGH (ref 36.0–46.0)
Hemoglobin: 14.9 g/dL (ref 12.0–15.0)
MCH: 30.3 pg (ref 26.0–34.0)
MCHC: 32.3 g/dL (ref 30.0–36.0)
MCV: 93.9 fL (ref 80.0–100.0)
Platelets: 206 10*3/uL (ref 150–400)
RBC: 4.92 MIL/uL (ref 3.87–5.11)
RDW: 14.2 % (ref 11.5–15.5)
WBC: 7.5 10*3/uL (ref 4.0–10.5)
nRBC: 0 % (ref 0.0–0.2)

## 2022-10-04 LAB — HEPATIC FUNCTION PANEL
ALT: 54 U/L — ABNORMAL HIGH (ref 0–44)
AST: 36 U/L (ref 15–41)
Albumin: 3.9 g/dL (ref 3.5–5.0)
Alkaline Phosphatase: 98 U/L (ref 38–126)
Bilirubin, Direct: 0.2 mg/dL (ref 0.0–0.2)
Indirect Bilirubin: 0.6 mg/dL (ref 0.3–0.9)
Total Bilirubin: 0.8 mg/dL (ref 0.3–1.2)
Total Protein: 7.5 g/dL (ref 6.5–8.1)

## 2022-10-04 LAB — CREATININE, URINE, RANDOM: Creatinine, Urine: 166.11 mg/dL

## 2022-10-04 LAB — HEMOGLOBIN A1C
Hgb A1c MFr Bld: 7.2 % — ABNORMAL HIGH (ref 4.8–5.6)
Mean Plasma Glucose: 160 mg/dL

## 2022-10-11 ENCOUNTER — Other Ambulatory Visit: Payer: Self-pay | Admitting: Cardiology

## 2022-10-11 ENCOUNTER — Ambulatory Visit: Payer: BC Managed Care – PPO | Admitting: Obstetrics and Gynecology

## 2022-10-13 ENCOUNTER — Telehealth: Payer: Self-pay | Admitting: Cardiology

## 2022-10-13 ENCOUNTER — Other Ambulatory Visit: Payer: Self-pay | Admitting: *Deleted

## 2022-10-13 DIAGNOSIS — E785 Hyperlipidemia, unspecified: Secondary | ICD-10-CM

## 2022-10-13 DIAGNOSIS — Z79899 Other long term (current) drug therapy: Secondary | ICD-10-CM

## 2022-10-13 MED ORDER — ROSUVASTATIN CALCIUM 20 MG PO TABS: 20.0000 mg | ORAL_TABLET | Freq: Every day | ORAL | 3 refills | Status: DC

## 2022-10-13 NOTE — Telephone Encounter (Signed)
LDL 80, close to goal of 70 with Crestor '10mg'$   ALT 43 normal (was one month ago 50)   Let's increase Crestor to '20mg'$  once a day and recheck lipid panel in 3 months. Continue with diet and exercise  Candee Furbish, MD

## 2022-10-13 NOTE — Telephone Encounter (Signed)
Pt is aware of results.  Crestor 20 mg rx sent into [facility] as requested.  Lab order placed for [facility].

## 2022-10-13 NOTE — Telephone Encounter (Signed)
   Pt is returning call to get result ?

## 2022-10-30 ENCOUNTER — Inpatient Hospital Stay: Payer: BC Managed Care – PPO | Attending: Hematology

## 2022-11-24 ENCOUNTER — Encounter: Payer: Self-pay | Admitting: Hematology

## 2022-11-24 NOTE — Progress Notes (Signed)
Created encounter in error.  Only orders placed at this time.

## 2022-11-27 ENCOUNTER — Other Ambulatory Visit: Payer: Self-pay | Admitting: Physician Assistant

## 2022-11-27 DIAGNOSIS — E559 Vitamin D deficiency, unspecified: Secondary | ICD-10-CM

## 2022-11-30 ENCOUNTER — Inpatient Hospital Stay: Payer: BC Managed Care – PPO

## 2022-11-30 ENCOUNTER — Inpatient Hospital Stay: Payer: BC Managed Care – PPO | Attending: Hematology

## 2022-12-28 ENCOUNTER — Inpatient Hospital Stay: Payer: BC Managed Care – PPO

## 2022-12-28 ENCOUNTER — Inpatient Hospital Stay: Payer: BC Managed Care – PPO | Attending: Hematology

## 2023-01-29 ENCOUNTER — Inpatient Hospital Stay: Payer: BC Managed Care – PPO

## 2023-01-29 ENCOUNTER — Inpatient Hospital Stay: Payer: BC Managed Care – PPO | Attending: Hematology

## 2023-02-02 ENCOUNTER — Inpatient Hospital Stay: Payer: BC Managed Care – PPO | Admitting: Physician Assistant

## 2023-02-08 ENCOUNTER — Encounter (INDEPENDENT_AMBULATORY_CARE_PROVIDER_SITE_OTHER): Payer: Self-pay | Admitting: *Deleted

## 2023-08-22 ENCOUNTER — Other Ambulatory Visit (HOSPITAL_COMMUNITY)
Admission: RE | Admit: 2023-08-22 | Discharge: 2023-08-22 | Disposition: A | Discharge status: Home / Self Care | Payer: BC Managed Care – PPO | Source: Ambulatory Visit | Attending: Pain Medicine | Admitting: Pain Medicine

## 2023-08-22 DIAGNOSIS — Z79899 Other long term (current) drug therapy: Secondary | ICD-10-CM | POA: Diagnosis present

## 2023-08-22 LAB — CBC WITH DIFFERENTIAL/PLATELET
Abs Immature Granulocytes: 0.01 10*3/uL (ref 0.00–0.07)
Basophils Absolute: 0 10*3/uL (ref 0.0–0.1)
Basophils Relative: 1 %
Eosinophils Absolute: 0.2 10*3/uL (ref 0.0–0.5)
Eosinophils Relative: 4 %
HCT: 41.4 % (ref 36.0–46.0)
Hemoglobin: 13.5 g/dL (ref 12.0–15.0)
Immature Granulocytes: 0 %
Lymphocytes Relative: 29 %
Lymphs Abs: 1.8 10*3/uL (ref 0.7–4.0)
MCH: 31.3 pg (ref 26.0–34.0)
MCHC: 32.6 g/dL (ref 30.0–36.0)
MCV: 95.8 fL (ref 80.0–100.0)
Monocytes Absolute: 0.6 10*3/uL (ref 0.1–1.0)
Monocytes Relative: 10 %
Neutro Abs: 3.5 10*3/uL (ref 1.7–7.7)
Neutrophils Relative %: 56 %
Platelets: 189 10*3/uL (ref 150–400)
RBC: 4.32 MIL/uL (ref 3.87–5.11)
RDW: 13.9 % (ref 11.5–15.5)
WBC: 6.1 10*3/uL (ref 4.0–10.5)
nRBC: 0 % (ref 0.0–0.2)

## 2023-08-22 LAB — HEPATIC FUNCTION PANEL
ALT: 64 U/L — ABNORMAL HIGH (ref 0–44)
AST: 40 U/L (ref 15–41)
Albumin: 3.8 g/dL (ref 3.5–5.0)
Alkaline Phosphatase: 90 U/L (ref 38–126)
Bilirubin, Direct: 0.2 mg/dL (ref 0.0–0.2)
Indirect Bilirubin: 0.8 mg/dL (ref 0.3–0.9)
Total Bilirubin: 1 mg/dL (ref <1.2)
Total Protein: 7 g/dL (ref 6.5–8.1)

## 2023-08-22 LAB — CREATININE, SERUM
Creatinine, Ser: 0.8 mg/dL (ref 0.44–1.00)
GFR, Estimated: >60 mL/min (ref >60)

## 2023-10-18 ENCOUNTER — Encounter (INDEPENDENT_AMBULATORY_CARE_PROVIDER_SITE_OTHER): Payer: Self-pay | Admitting: *Deleted

## 2023-10-23 ENCOUNTER — Encounter (INDEPENDENT_AMBULATORY_CARE_PROVIDER_SITE_OTHER): Payer: Self-pay | Admitting: Gastroenterology

## 2023-10-23 ENCOUNTER — Ambulatory Visit (INDEPENDENT_AMBULATORY_CARE_PROVIDER_SITE_OTHER): Payer: BC Managed Care – PPO | Admitting: Gastroenterology

## 2023-10-23 VITALS — BP 128/84 | HR 96 | Temp 97.3°F | Ht 66.0 in | Wt 247.3 lb

## 2023-10-23 DIAGNOSIS — K909 Intestinal malabsorption, unspecified: Secondary | ICD-10-CM

## 2023-10-23 DIAGNOSIS — R109 Unspecified abdominal pain: Secondary | ICD-10-CM | POA: Diagnosis not present

## 2023-10-23 DIAGNOSIS — Z6839 Body mass index (BMI) 39.0-39.9, adult: Secondary | ICD-10-CM | POA: Diagnosis not present

## 2023-10-23 DIAGNOSIS — R197 Diarrhea, unspecified: Secondary | ICD-10-CM

## 2023-10-23 DIAGNOSIS — R1013 Epigastric pain: Secondary | ICD-10-CM

## 2023-10-23 DIAGNOSIS — D500 Iron deficiency anemia secondary to blood loss (chronic): Secondary | ICD-10-CM

## 2023-10-23 DIAGNOSIS — Z1211 Encounter for screening for malignant neoplasm of colon: Secondary | ICD-10-CM | POA: Insufficient documentation

## 2023-10-23 MED ORDER — HYOSCYAMINE SULFATE 0.125 MG SL SUBL: 0.1250 mg | SUBLINGUAL_TABLET | Freq: Four times a day (QID) | SUBLINGUAL | 1 refills | Status: DC | PRN

## 2023-10-23 MED ORDER — METAMUCIL SMOOTH TEXTURE 58.6 % PO POWD: 1.0000 | Freq: Two times a day (BID) | ORAL | 2 refills | Status: AC

## 2023-10-23 MED ORDER — CLENPIQ 10-3.5-12 MG-GM -GM/175ML PO SOLN: 1.0000 | ORAL | 0 refills | Status: DC

## 2023-10-23 NOTE — Patient Instructions (Signed)
It was very nice to meet you today, as dicussed with will plan for the following :  1) EGD and Colonoscopy  2)  Levsin (hyoscyamine) every 8 hours as needed for abdominal pain

## 2023-10-23 NOTE — Progress Notes (Signed)
Jody Stokes , M.D. Gastroenterology & Hepatology [facility] [address] [place], [place] [address] Primary Care Physician: Jody Stokes, [place] [address] [place] [address]  Chief Complaint:   persistent abdominal pain , diarrhea and colon cancer screening   History of Present Illness:  Jody Stokes is a 63 y.o. female with  PE with IVC filter 2017 after hiatal hernia surgery 2017, IDA, Vitamin B12 deficiency, rheumatoid arthritis, sleep apnea on cpap, history of large colon polyp burden Status post Roux en y  bypass 2018 and Cholecystectomy in 2010.  She reports having a history of iron deficiency for 17 years on infusion .  She has been vitamin B12 deficient since her gastric bypass surgery in 2018. who presents for evaluation of persistent abdominal pain , diarrhea and colon cancer screening   Patient reports she has epigastric pain mostly postprandial without any relieving factor.  Patient takes over-the-counter painkillers such as Alka-Seltzer.  She recently started taking omeprazole without any relief.  Although patient has been on Mounjaro but pain preceded weight before starting Mounjaro.  After starting GLP-1 she has lost significant weight and A1c has not been under control.The patient denies having any nausea, vomiting, fever, chills, hematochezia, melena, hematemesis,  jaundice, pruritus.  Patient has a history of chronic diarrhea previously had colonoscopy with biopsies negative for microscopic lightest.  Patient was started on cholestyramine with some response but started to have diarrhea again.  Reports it is usually postprandial without nocturnal symptoms   Endoscopic history:  EGD 2020  Impression: - Normal esophagus. - Z- line irregular, 42 cm from the incisors. - Gastric bypass with a small- sized pouch. Gastrojejunal anastomosis characterized by healthy appearing mucosa. - Normal examined jejunum. - No specimens  collected.  Comment; symptoms may be due to Methotrexate. Will rule out other etiologies before considering stopping methotrexate.   Colonoscopy 03/18/2018:  The entire examined colon is normal. Biopsies negative for colitis. External hemorrhoids. 5 year recall.   Colonoscopy 11/19/2014:  Normal rectum. Somewhat elongated, redundant colon. Few scattered pancolonic diverticula. Multiple tubular adenomatous colonic polyps-(1) 5 mm polyp at the ileocecal valve, (1) 5 mm polyp at hepatic flexure, (1) 5 mm polyp at the splenic flexure and (1) 5 mm polyp in the mid descending segment. Otherwise, the remainder of the colonic mucosa appeared normal. Ascending colon biopsies negative for microscopic colitis.  The distal 5 cm of terminal ileum mucosa appeared normal.   EGD 09/27/2016: -Normal esophagus. - Z-line irregular, 40 cm from the incisors with focal erythema- Roux-en-Y gastrojejunostomy. - Very small gastric pouch with normal mucosa and patent gastrojejunal anastomosis without ulceration. - The examined jejunum was normal. efferent loop normal to 90 cm from the incisors. Focal edema noted to blind afferent loop.  - No specimens collected  Past Medical History: Past Medical History:  Diagnosis Date   Anemia    B12 deficiency 03/14/2016   Congenital heart disease    Depression    Fibromyalgia    Gastric bypass status for obesity 03/14/2016   Iron deficiency anemia 03/14/2016   Presence of IVC filter    Pulmonary embolism (HCC)    Rheumatoid arthritis (HCC)    Rheumatoid   Sleep apnea    uses CPAP   Tubular adenoma of colon     Past Surgical History: Past Surgical History:  Procedure Laterality Date   BIOPSY N/A 11/19/2014   Procedure: BIOPSY;  Surgeon: Corbin Ade, MD;  Location: [facility];  Service: Endoscopy;  Laterality: N/A;  Ascending Colon   BIOPSY  09/22/2016   Procedure: BIOPSY;  Surgeon: Malissa Hippo, MD;  Location: [facility];  Service: Endoscopy;;  small bowel    BIOPSY  03/08/2018   Procedure: BIOPSY;  Surgeon: Malissa Hippo, MD;  Location: [facility];  Service: Endoscopy;;  colon   CARPAL TUNNEL RELEASE Bilateral    CHOLECYSTECTOMY  2009   COLONOSCOPY  07/2012   transverse colon 5 mm polyp removed   COLONOSCOPY WITH PROPOFOL N/A 11/19/2014   RMR: mutilple colonic polyps removed as described above. Pancolonic diverticulosis(few).  Status post mucosal biopsy to assess for microscopic coliits. /    COLONOSCOPY WITH PROPOFOL N/A 03/08/2018   Procedure: COLONOSCOPY WITH PROPOFOL;  Surgeon: Malissa Hippo, MD;  Location: [facility];  Service: Endoscopy;  Laterality: N/A;   ESOPHAGOGASTRODUODENOSCOPY N/A 09/22/2016   Procedure: ESOPHAGOGASTRODUODENOSCOPY (EGD);  Surgeon: Malissa Hippo, MD;  [facility]: [facility];  Service: Endoscopy;  Laterality: N/A;  1:15   ESOPHAGOGASTRODUODENOSCOPY (EGD) WITH PROPOFOL N/A 09/05/2019   Procedure: ESOPHAGOGASTRODUODENOSCOPY (EGD) WITH PROPOFOL;  Surgeon: Malissa Hippo, MD;  Location: [facility];  Service: Endoscopy;  Laterality: N/A;  1005am   FOOT SURGERY  2015   left and right   GASTRIC BYPASS  2008   HERNIA REPAIR     HIATAL HERNIA REPAIR  2007   POLYPECTOMY N/A 11/19/2014   Procedure: POLYPECTOMY;  Surgeon: Corbin Ade, MD;  Location: [facility];  Service: Endoscopy;  Laterality: N/A;  ileocecal valve, Hepatic Flexure, Splenic Flexure, Descending Colon    TUBAL LIGATION      Family History: Family History  Problem Relation Age of Onset   HIV Sister    Hypertension Brother    Diabetes Brother    Kidney disease Brother    Hypertension Brother    Heart Problems Brother        No details.    Hypertension Brother    Colon cancer Neg Hx    Breast cancer Neg Hx     Social History: Social History   Tobacco Use  Smoking Status Never  Smokeless Tobacco Never   Social History   Substance and Sexual Activity  Alcohol Use No   Alcohol/week: 0.0 standard drinks of alcohol   Social  History   Substance and Sexual Activity  Drug Use No    Allergies: Allergies  Allergen Reactions   Eluxadoline Other (See Comments)    Patient states that she has extreme abdominal cramping.   Methotrexate     Elevated LFTs    Medications: Current Outpatient Medications  Medication Sig Dispense Refill   Cyanocobalamin 1000 MCG/ML KIT Inject as directed. Every 30 days     diphenhydramine-acetaminophen (TYLENOL PM) 25-500 MG TABS tablet Take by mouth at bedtime as needed.     DULoxetine (CYMBALTA) 60 MG capsule 60 mg daily.     folic acid (FOLVITE) 1 MG tablet Take 1 mg by mouth daily.     losartan (COZAAR) 50 MG tablet Take 50 mg by mouth daily.     meclizine (ANTIVERT) 12.5 MG tablet Take 12.5 mg by mouth every 4 (four) hours as needed.     metoprolol succinate (TOPROL XL) 25 MG 24 hr tablet Take 1 tablet (25 mg total) by mouth daily. 90 tablet 3   ondansetron (ZOFRAN-ODT) 8 MG disintegrating tablet Take 8 mg by mouth daily as needed.     promethazine (PHENERGAN) 25 MG tablet Take 25  mg by mouth 3 (three) times daily as needed.     rosuvastatin (CRESTOR) 20 MG tablet Take 1 tablet (20 mg total) by mouth daily. 90 tablet 3   sarilumab (KEVZARA) 200 MG/1. SOSY Inject into the vein. Q 14 days     tirzepatide (MOUNJARO) 15 MG/0.5ML Pen Inject 15 mg into the skin once a week.     Vitamin D, Ergocalciferol, (DRISDOL) 1.25 MG (50000 UNIT) CAPS capsule TAKE 1 CAPSULE BY MOUTH EVERY 7 DAYS 5 capsule 11   No current facility-administered medications for this visit.    Review of Systems: GENERAL: negative for malaise, night sweats HEENT: No changes in hearing or vision, no nose bleeds or other nasal problems. NECK: Negative for lumps, goiter, pain and significant neck swelling RESPIRATORY: Negative for cough, wheezing CARDIOVASCULAR: Negative for chest pain, leg swelling, palpitations, orthopnea GI: SEE HPI MUSCULOSKELETAL: Negative for joint pain or swelling, back pain, and muscle  pain. SKIN: Negative for lesions, rash HEMATOLOGY Negative for prolonged bleeding, bruising easily, and swollen nodes. ENDOCRINE: Negative for cold or heat intolerance, polyuria, polydipsia and goiter. NEURO: negative for tremor, gait imbalance, syncope and seizures. The remainder of the review of systems is noncontributory.   Physical Exam: BP 128/84 (BP Location: Right Arm, Patient Position: Sitting, Cuff Size: Large)   Pulse 96   Temp (!) 97.3 F (36.3 C) (Temporal)   Ht 5\' 6"  (1.676 m)   Wt 247 lb 4.8 oz (112.2 kg)   LMP 03/01/2017   BMI 39.92 kg/m  GENERAL: The patient is AO x3, in no acute distress. HEENT: Head is normocephalic and atraumatic. EOMI are intact. Mouth is well hydrated and without lesions. NECK: Supple. No masses LUNGS: Clear to auscultation. No presence of rhonchi/wheezing/rales. Adequate chest expansion HEART: RRR, normal s1 and s2. ABDOMEN: Soft, nontender, no guarding, no peritoneal signs, and nondistended. BS +. No masses.   Imaging/Labs: as above     Latest Ref Rng & Units 08/22/2023    8:06 AM 10/04/2022   11:31 AM 07/28/2022    1:10 PM  CBC  WBC 4.0 - 10.5 K/uL 6.1  7.5  6.9   Hemoglobin 12.0 - 15.0 g/dL 16.1  09.6  04.5   Hematocrit 36.0 - 46.0 % 41.4  46.2  45.3   Platelets 150 - 400 K/uL 189  206  195    Lab Results  Component Value Date   IRON 98 06/15/2022   TIBC 352 06/15/2022   FERRITIN 183 06/15/2022    I personally reviewed and interpreted the available labs, imaging and endoscopic files.  Last labs from 08/2023 hemoglobin 13.5 platelet 189   CT Abdomen pelvis 2020 IMPRESSION: 1. Signs of prior gastric bypass procedure. No evidence of bowel obstruction or perienteric inflammation. 2. Mild thickening of the distal esophagus and gastric pouch, reportedly with recent normal endoscopy. Perhaps this relates to mild gastritis or esophagitis. 3. Stable elevation of the right hemidiaphragm and juxta diaphragmatic atelectasis. 4.  Stable low-density lesion in the right hepatic lobe, likely a hemangioma. 5. IVC filter in situ.    Impression and Plan:  Jody Stokes is a 63 y.o. female with  PE with IVC filter 2017 after hiatal hernia surgery 2017, IDA, Vitamin B12 deficiency, rheumatoid arthritis, sleep apnea on cpap, history of large colon polyp burden Status post Roux en y  bypass 2018 and Cholecystectomy in 2010.  She reports having a history of iron deficiency for 17 years on infusion .  She has been vitamin B12  deficient since her gastric bypass surgery in 2018. who presents for evaluation of persistent abdominal pain , diarrhea and colon cancer screening   #Post prandial abdominal pain  #Diarrhea  This could be anastomotic or marginal ulceration given history of gastric bypass also patient takes intermittent NSAIDs  Could be dumping syndrome as rapid gastric emptying into small bowel  can lead to abdominal pain and diarrhea which patient both have.  Patient has noticed some relationship with carbohydrates  Internal hernia is also another differential with history of Roux-en-Y gastric bypass (RYGB) due to presented defects created during surgery. Although patient had negative CT few years back  Patient did present with postprandial abdominal pain  Bile reflux is another differential especially if patient has long Roux limb which can present with epigastric postprandial discomfort. Patient did temporarily responded to cholestyramine previously  SIBO (small intestinal bacterial overgrowth) is another differential given alteration in gut anatomy with abdominal pain and diarrhea  Patient is on tirzepatide, GLP-1 agonist are notorious to cause abdominal pain.  Although this is unlikely in her case as her pain preceded much before starting GLP-1.  Would continue tirzepatide for now given patient is benefiting from significant weight loss and normalization of A1c  Recommendation :  Upper endoscopy to evaluate  follow-up marginal ulceration or any other lesion explaining abdominal pain. Will take duodenal biopsies to evaluate for celiac disease as well If negative EGD will obtain upper GI series and SIBO testing in future Dietary modifications: smaller meals, low carbohydrate intake, and avoiding high-sugar foods. If EGD is negative may restart bile acid sequestrant's; cholestyramine  Patient to continue following up with hematology for iron vitamin B12 supplementation Will add MiraLAX twice daily to bulk of the stool Will give trial of Levsin  #Colon cancer screening   Last colonoscopy 2019 suggest repeat 5 years.  Patient reports history of significant colon polyp burden  Plan for screening colonoscopy.  May take biopsies again to evaluate for microscopic colitis  #BMI 39   Patient already on GLP-1 and continues to lose weight      - walking at a brisk pace/biking at moderate intesity 2.5-5 hours per week     - use pedometer/step counter to track activity     - goal to lose 5-10% of initial body weight     - avoid suagry drinks and juices, use zero calorie beverages     - increase water intake     - eat a low carb diet with plenty of veggies and fruit     - Get sufficient sleep 7-8 hrs nightly     - maitain active lifestyle     - avoid alcohol     - recommend 2-3 cups Coffee daily    All questions were answered.      Jody Lawman, MD Gastroenterology and Hepatology [facility]   This chart has been completed using [facility] Dictation software, and while attempts have been made to ensure accuracy , certain words and phrases may not be transcribed as intended

## 2023-10-23 NOTE — H&P (View-Only) (Signed)
[name] , M.D. Gastroenterology & Hepatology [facility] Gastroenterology [address] [place], [place] [address] Primary Care Physician: Lawerance Sabal, [place] [address] [place] [address]  Chief Complaint:   persistent abdominal pain , diarrhea and colon cancer screening   History of Present Illness:  Jody Stokes is a 63 y.o. female with  PE with IVC filter 2017 after hiatal hernia surgery 2017, IDA, Vitamin B12 deficiency, rheumatoid arthritis, sleep apnea on cpap, history of large colon polyp burden Status post Roux en y  bypass 2018 and Cholecystectomy in 2010.  She reports having a history of iron deficiency for 17 years on infusion .  She has been vitamin B12 deficient since her gastric bypass surgery in 2018. who presents for evaluation of persistent abdominal pain , diarrhea and colon cancer screening   Patient reports she has epigastric pain mostly postprandial without any relieving factor.  Patient takes over-the-counter painkillers such as Alka-Seltzer.  She recently started taking omeprazole without any relief.  Although patient has been on Mounjaro but pain preceded weight before starting Mounjaro.  After starting GLP-1 she has lost significant weight and A1c has not been under control.The patient denies having any nausea, vomiting, fever, chills, hematochezia, melena, hematemesis,  jaundice, pruritus.  Patient has a history of chronic diarrhea previously had colonoscopy with biopsies negative for microscopic lightest.  Patient was started on cholestyramine with some response but started to have diarrhea again.  Reports it is usually postprandial without nocturnal symptoms   Endoscopic history:  EGD 2020  Impression: - Normal esophagus. - Z- line irregular, 42 cm from the incisors. - Gastric bypass with a small- sized pouch. Gastrojejunal anastomosis characterized by healthy appearing mucosa. - Normal examined jejunum. - No specimens  collected.  Comment; symptoms may be due to Methotrexate. Will rule out other etiologies before considering stopping methotrexate.   Colonoscopy 03/18/2018:  The entire examined colon is normal. Biopsies negative for colitis. External hemorrhoids. 5 year recall.   Colonoscopy 11/19/2014:  Normal rectum. Somewhat elongated, redundant colon. Few scattered pancolonic diverticula. Multiple tubular adenomatous colonic polyps-(1) 5 mm polyp at the ileocecal valve, (1) 5 mm polyp at hepatic flexure, (1) 5 mm polyp at the splenic flexure and (1) 5 mm polyp in the mid descending segment. Otherwise, the remainder of the colonic mucosa appeared normal. Ascending colon biopsies negative for microscopic colitis.  The distal 5 cm of terminal ileum mucosa appeared normal.   EGD 09/27/2016: -Normal esophagus. - Z-line irregular, 40 cm from the incisors with focal erythema- Roux-en-Y gastrojejunostomy. - Very small gastric pouch with normal mucosa and patent gastrojejunal anastomosis without ulceration. - The examined jejunum was normal. efferent loop normal to 90 cm from the incisors. Focal edema noted to blind afferent loop.  - No specimens collected  Past Medical History: Past Medical History:  Diagnosis Date   Anemia    B12 deficiency 03/14/2016   Congenital heart disease    Depression    Fibromyalgia    Gastric bypass status for obesity 03/14/2016   Iron deficiency anemia 03/14/2016   Presence of IVC filter    Pulmonary embolism (HCC)    Rheumatoid arthritis (HCC)    Rheumatoid   Sleep apnea    uses CPAP   Tubular adenoma of colon     Past Surgical History: Past Surgical History:  Procedure Laterality Date   BIOPSY N/A 11/19/2014   Procedure: BIOPSY;  Surgeon: Corbin Ade, MD;  Location: [facility];  Service: Endoscopy;  Laterality: N/A;  Ascending Colon   BIOPSY  09/22/2016   Procedure: BIOPSY;  Surgeon: Malissa Hippo, MD;  Location: [facility];  Service: Endoscopy;;  small bowel    BIOPSY  03/08/2018   Procedure: BIOPSY;  Surgeon: Malissa Hippo, MD;  Location: [facility];  Service: Endoscopy;;  colon   CARPAL TUNNEL RELEASE Bilateral    CHOLECYSTECTOMY  2009   COLONOSCOPY  07/2012   transverse colon 5 mm polyp removed   COLONOSCOPY WITH PROPOFOL N/A 11/19/2014   RMR: mutilple colonic polyps removed as described above. Pancolonic diverticulosis(few).  Status post mucosal biopsy to assess for microscopic coliits. /    COLONOSCOPY WITH PROPOFOL N/A 03/08/2018   Procedure: COLONOSCOPY WITH PROPOFOL;  Surgeon: Malissa Hippo, MD;  Location: [facility];  Service: Endoscopy;  Laterality: N/A;   ESOPHAGOGASTRODUODENOSCOPY N/A 09/22/2016   Procedure: ESOPHAGOGASTRODUODENOSCOPY (EGD);  Surgeon: Malissa Hippo, MD;  [facility]: [facility];  Service: Endoscopy;  Laterality: N/A;  1:15   ESOPHAGOGASTRODUODENOSCOPY (EGD) WITH PROPOFOL N/A 09/05/2019   Procedure: ESOPHAGOGASTRODUODENOSCOPY (EGD) WITH PROPOFOL;  Surgeon: Malissa Hippo, MD;  [facility]: [facility];  Service: Endoscopy;  Laterality: N/A;  1005am   FOOT SURGERY  2015   left and right   GASTRIC BYPASS  2008   HERNIA REPAIR     HIATAL HERNIA REPAIR  2007   POLYPECTOMY N/A 11/19/2014   Procedure: POLYPECTOMY;  Surgeon: Corbin Ade, MD;  Location: [facility];  Service: Endoscopy;  Laterality: N/A;  ileocecal valve, Hepatic Flexure, Splenic Flexure, Descending Colon    TUBAL LIGATION      Family History: Family History  Problem Relation Age of Onset   HIV Sister    Hypertension Brother    Diabetes Brother    Kidney disease Brother    Hypertension Brother    Heart Problems Brother        No details.    Hypertension Brother    Colon cancer Neg Hx    Breast cancer Neg Hx     Social History: Social History   Tobacco Use  Smoking Status Never  Smokeless Tobacco Never   Social History   Substance and Sexual Activity  Alcohol Use No   Alcohol/week: 0.0 standard drinks of alcohol   Social  History   Substance and Sexual Activity  Drug Use No    Allergies: Allergies  Allergen Reactions   Eluxadoline Other (See Comments)    Patient states that she has extreme abdominal cramping.   Methotrexate     Elevated LFTs    Medications: Current Outpatient Medications  Medication Sig Dispense Refill   Cyanocobalamin 1000 MCG/ML KIT Inject as directed. Every 30 days     diphenhydramine-acetaminophen (TYLENOL PM) 25-500 MG TABS tablet Take by mouth at bedtime as needed.     DULoxetine (CYMBALTA) 60 MG capsule 60 mg daily.     folic acid (FOLVITE) 1 MG tablet Take 1 mg by mouth daily.     losartan (COZAAR) 50 MG tablet Take 50 mg by mouth daily.     meclizine (ANTIVERT) 12.5 MG tablet Take 12.5 mg by mouth every 4 (four) hours as needed.     metoprolol succinate (TOPROL XL) 25 MG 24 hr tablet Take 1 tablet (25 mg total) by mouth daily. 90 tablet 3   ondansetron (ZOFRAN-ODT) 8 MG disintegrating tablet Take 8 mg by mouth daily as needed.     promethazine (PHENERGAN) 25 MG tablet Take 25  mg by mouth 3 (three) times daily as needed.     rosuvastatin (CRESTOR) 20 MG tablet Take 1 tablet (20 mg total) by mouth daily. 90 tablet 3   sarilumab (KEVZARA) 200 MG/1. SOSY Inject into the vein. Q 14 days     tirzepatide (MOUNJARO) 15 MG/0.5ML Pen Inject 15 mg into the skin once a week.     Vitamin D, Ergocalciferol, (DRISDOL) 1.25 MG (50000 UNIT) CAPS capsule TAKE 1 CAPSULE BY MOUTH EVERY 7 DAYS 5 capsule 11   No current facility-administered medications for this visit.    Review of Systems: GENERAL: negative for malaise, night sweats HEENT: No changes in hearing or vision, no nose bleeds or other nasal problems. NECK: Negative for lumps, goiter, pain and significant neck swelling RESPIRATORY: Negative for cough, wheezing CARDIOVASCULAR: Negative for chest pain, leg swelling, palpitations, orthopnea GI: SEE HPI MUSCULOSKELETAL: Negative for joint pain or swelling, back pain, and muscle  pain. SKIN: Negative for lesions, rash HEMATOLOGY Negative for prolonged bleeding, bruising easily, and swollen nodes. ENDOCRINE: Negative for cold or heat intolerance, polyuria, polydipsia and goiter. NEURO: negative for tremor, gait imbalance, syncope and seizures. The remainder of the review of systems is noncontributory.   Physical Exam: BP 128/84 (BP Location: Right Arm, Patient Position: Sitting, Cuff Size: Large)   Pulse 96   Temp (!) 97.3 F (36.3 C) (Temporal)   Ht 5\' 6"  (1.676 m)   Wt 247 lb 4.8 oz (112.2 kg)   LMP 03/01/2017   BMI 39.92 kg/m  GENERAL: The patient is AO x3, in no acute distress. HEENT: Head is normocephalic and atraumatic. EOMI are intact. Mouth is well hydrated and without lesions. NECK: Supple. No masses LUNGS: Clear to auscultation. No presence of rhonchi/wheezing/rales. Adequate chest expansion HEART: RRR, normal s1 and s2. ABDOMEN: Soft, nontender, no guarding, no peritoneal signs, and nondistended. BS +. No masses.   Imaging/Labs: as above     Latest Ref Rng & Units 08/22/2023    8:06 AM 10/04/2022   11:31 AM 07/28/2022    1:10 PM  CBC  WBC 4.0 - 10.5 K/uL 6.1  7.5  6.9   Hemoglobin 12.0 - 15.0 g/dL 16.1  09.6  04.5   Hematocrit 36.0 - 46.0 % 41.4  46.2  45.3   Platelets 150 - 400 K/uL 189  206  195    Lab Results  Component Value Date   IRON 98 06/15/2022   TIBC 352 06/15/2022   FERRITIN 183 06/15/2022    I personally reviewed and interpreted the available labs, imaging and endoscopic files.  Last labs from 08/2023 hemoglobin 13.5 platelet 189   CT Abdomen pelvis 2020 IMPRESSION: 1. Signs of prior gastric bypass procedure. No evidence of bowel obstruction or perienteric inflammation. 2. Mild thickening of the distal esophagus and gastric pouch, reportedly with recent normal endoscopy. Perhaps this relates to mild gastritis or esophagitis. 3. Stable elevation of the right hemidiaphragm and juxta diaphragmatic atelectasis. 4.  Stable low-density lesion in the right hepatic lobe, likely a hemangioma. 5. IVC filter in situ.    Impression and Plan:  Jody Stokes is a 63 y.o. female with  PE with IVC filter 2017 after hiatal hernia surgery 2017, IDA, Vitamin B12 deficiency, rheumatoid arthritis, sleep apnea on cpap, history of large colon polyp burden Status post Roux en y  bypass 2018 and Cholecystectomy in 2010.  She reports having a history of iron deficiency for 17 years on infusion .  She has been vitamin B12  deficient since her gastric bypass surgery in 2018. who presents for evaluation of persistent abdominal pain , diarrhea and colon cancer screening   #Post prandial abdominal pain  #Diarrhea  This could be anastomotic or marginal ulceration given history of gastric bypass also patient takes intermittent NSAIDs  Could be dumping syndrome as rapid gastric emptying into small bowel  can lead to abdominal pain and diarrhea which patient both have.  Patient has noticed some relationship with carbohydrates  Internal hernia is also another differential with history of Roux-en-Y gastric bypass (RYGB) due to presented defects created during surgery. Although patient had negative CT few years back  Patient did present with postprandial abdominal pain  Bile reflux is another differential especially if patient has long Roux limb which can present with epigastric postprandial discomfort. Patient did temporarily responded to cholestyramine previously  SIBO (small intestinal bacterial overgrowth) is another differential given alteration in gut anatomy with abdominal pain and diarrhea  Patient is on tirzepatide, GLP-1 agonist are notorious to cause abdominal pain.  Although this is unlikely in her case as her pain preceded much before starting GLP-1.  Would continue tirzepatide for now given patient is benefiting from significant weight loss and normalization of A1c  Recommendation :  Upper endoscopy to evaluate  follow-up marginal ulceration or any other lesion explaining abdominal pain. Will take duodenal biopsies to evaluate for celiac disease as well If negative EGD will obtain upper GI series and SIBO testing in future Dietary modifications: smaller meals, low carbohydrate intake, and avoiding high-sugar foods. If EGD is negative may restart bile acid sequestrant's; cholestyramine  Patient to continue following up with hematology for iron vitamin B12 supplementation Will add MiraLAX twice daily to bulk of the stool Will give trial of Levsin  #Colon cancer screening   Last colonoscopy 2019 suggest repeat 5 years.  Patient reports history of significant colon polyp burden  Plan for screening colonoscopy.  May take biopsies again to evaluate for microscopic colitis  #BMI 39   Patient already on GLP-1 and continues to lose weight      - walking at a brisk pace/biking at moderate intesity 2.5-5 hours per week     - use pedometer/step counter to track activity     - goal to lose 5-10% of initial body weight     - avoid suagry drinks and juices, use zero calorie beverages     - increase water intake     - eat a low carb diet with plenty of veggies and fruit     - Get sufficient sleep 7-8 hrs nightly     - maitain active lifestyle     - avoid alcohol     - recommend 2-3 cups Coffee daily    All questions were answered.      [name], MD Gastroenterology and Hepatology [facility] Gastroenterology   This chart has been completed using [facility] Dictation software, and while attempts have been made to ensure accuracy , certain words and phrases may not be transcribed as intended

## 2023-10-24 ENCOUNTER — Other Ambulatory Visit (INDEPENDENT_AMBULATORY_CARE_PROVIDER_SITE_OTHER): Payer: Self-pay | Admitting: Gastroenterology

## 2023-10-24 NOTE — Telephone Encounter (Signed)
Sent in yesterday for #90. Patient is requesting a 3 month supply

## 2023-11-08 ENCOUNTER — Other Ambulatory Visit: Payer: Self-pay | Admitting: Cardiology

## 2023-11-13 ENCOUNTER — Encounter (HOSPITAL_COMMUNITY)
Admission: RE | Admit: 2023-11-13 | Discharge: 2023-11-13 | Disposition: A | Discharge status: Home / Self Care | Payer: BC Managed Care – PPO | Source: Ambulatory Visit | Attending: Gastroenterology | Admitting: Gastroenterology

## 2023-11-13 ENCOUNTER — Other Ambulatory Visit: Payer: Self-pay

## 2023-11-13 ENCOUNTER — Encounter (HOSPITAL_COMMUNITY): Payer: Self-pay

## 2023-11-13 VITALS — Ht 66.0 in | Wt 237.0 lb

## 2023-11-13 DIAGNOSIS — I10 Essential (primary) hypertension: Secondary | ICD-10-CM

## 2023-11-13 HISTORY — DX: Type 2 diabetes mellitus without complications: E11.9

## 2023-11-13 NOTE — Pre-Procedure Instructions (Signed)
Attempted pre-op phonecall. Left VM for her to call us back.

## 2023-11-16 ENCOUNTER — Ambulatory Visit (HOSPITAL_COMMUNITY)
Admission: RE | Admit: 2023-11-16 | Discharge: 2023-11-16 | Disposition: A | Discharge status: Home / Self Care | Payer: BC Managed Care – PPO | Attending: Gastroenterology | Admitting: Gastroenterology

## 2023-11-16 ENCOUNTER — Ambulatory Visit (HOSPITAL_COMMUNITY): Payer: Self-pay | Admitting: Anesthesiology

## 2023-11-16 ENCOUNTER — Encounter (HOSPITAL_COMMUNITY): Payer: Self-pay | Admitting: Gastroenterology

## 2023-11-16 ENCOUNTER — Encounter (HOSPITAL_COMMUNITY)
Admission: RE | Disposition: A | Discharge status: Home / Self Care | Payer: Self-pay | Source: Home / Self Care | Attending: Gastroenterology

## 2023-11-16 DIAGNOSIS — K644 Residual hemorrhoidal skin tags: Secondary | ICD-10-CM | POA: Insufficient documentation

## 2023-11-16 DIAGNOSIS — K3189 Other diseases of stomach and duodenum: Secondary | ICD-10-CM | POA: Diagnosis not present

## 2023-11-16 DIAGNOSIS — M797 Fibromyalgia: Secondary | ICD-10-CM | POA: Diagnosis not present

## 2023-11-16 DIAGNOSIS — K635 Polyp of colon: Secondary | ICD-10-CM

## 2023-11-16 DIAGNOSIS — K5730 Diverticulosis of large intestine without perforation or abscess without bleeding: Secondary | ICD-10-CM | POA: Diagnosis not present

## 2023-11-16 DIAGNOSIS — I10 Essential (primary) hypertension: Secondary | ICD-10-CM | POA: Insufficient documentation

## 2023-11-16 DIAGNOSIS — Z86711 Personal history of pulmonary embolism: Secondary | ICD-10-CM | POA: Insufficient documentation

## 2023-11-16 DIAGNOSIS — Z9884 Bariatric surgery status: Secondary | ICD-10-CM | POA: Diagnosis not present

## 2023-11-16 DIAGNOSIS — K648 Other hemorrhoids: Secondary | ICD-10-CM | POA: Insufficient documentation

## 2023-11-16 DIAGNOSIS — D120 Benign neoplasm of cecum: Secondary | ICD-10-CM | POA: Diagnosis not present

## 2023-11-16 DIAGNOSIS — D123 Benign neoplasm of transverse colon: Secondary | ICD-10-CM | POA: Diagnosis not present

## 2023-11-16 DIAGNOSIS — E119 Type 2 diabetes mellitus without complications: Secondary | ICD-10-CM | POA: Insufficient documentation

## 2023-11-16 DIAGNOSIS — Z1211 Encounter for screening for malignant neoplasm of colon: Secondary | ICD-10-CM | POA: Insufficient documentation

## 2023-11-16 DIAGNOSIS — G4730 Sleep apnea, unspecified: Secondary | ICD-10-CM | POA: Insufficient documentation

## 2023-11-16 DIAGNOSIS — K2970 Gastritis, unspecified, without bleeding: Secondary | ICD-10-CM

## 2023-11-16 DIAGNOSIS — K6389 Other specified diseases of intestine: Secondary | ICD-10-CM | POA: Insufficient documentation

## 2023-11-16 HISTORY — PX: POLYPECTOMY: SHX5525

## 2023-11-16 HISTORY — PX: BIOPSY: SHX5522

## 2023-11-16 HISTORY — PX: ESOPHAGOGASTRODUODENOSCOPY (EGD) WITH PROPOFOL: SHX5813

## 2023-11-16 HISTORY — PX: COLONOSCOPY WITH PROPOFOL: SHX5780

## 2023-11-16 LAB — GLUCOSE, CAPILLARY: Glucose-Capillary: 90 mg/dL (ref 70–99)

## 2023-11-16 LAB — HM COLONOSCOPY

## 2023-11-16 SURGERY — COLONOSCOPY WITH PROPOFOL
Anesthesia: General

## 2023-11-16 MED ORDER — CHOLESTYRAMINE 4 G PO PACK: 4.0000 g | PACK | Freq: Every day | ORAL | 5 refills | Status: AC

## 2023-11-16 MED ORDER — PHENYLEPHRINE 80 MCG/ML (10ML) SYRINGE FOR IV PUSH (FOR BLOOD PRESSURE SUPPORT)
PREFILLED_SYRINGE | INTRAVENOUS | Status: DC | PRN
  Administered 2023-11-16: 160 ug via INTRAVENOUS

## 2023-11-16 MED ORDER — LACTATED RINGERS IV SOLN: INTRAVENOUS | Status: DC

## 2023-11-16 MED ORDER — PHENYLEPHRINE 80 MCG/ML (10ML) SYRINGE FOR IV PUSH (FOR BLOOD PRESSURE SUPPORT)
PREFILLED_SYRINGE | INTRAVENOUS | Status: AC
  Filled 2023-11-16: qty 10

## 2023-11-16 MED ORDER — MEPERIDINE HCL 50 MG/ML IJ SOLN: 6.2500 mg | INTRAMUSCULAR | Status: DC | PRN

## 2023-11-16 MED ORDER — FENTANYL CITRATE (PF) 100 MCG/2ML IJ SOLN
25.0000 ug | INTRAMUSCULAR | Status: DC | PRN
Start: 2023-11-16 — End: 2023-11-16

## 2023-11-16 MED ORDER — LIDOCAINE HCL (PF) 2 % IJ SOLN
INTRAMUSCULAR | Status: AC
  Filled 2023-11-16: qty 5

## 2023-11-16 MED ORDER — PHENYLEPHRINE HCL-NACL 20-0.9 MG/250ML-% IV SOLN
INTRAVENOUS | Status: AC
  Filled 2023-11-16: qty 250

## 2023-11-16 MED ORDER — STERILE WATER FOR IRRIGATION IR SOLN
Status: DC | PRN
  Administered 2023-11-16: 240 mL

## 2023-11-16 MED ORDER — METOCLOPRAMIDE HCL 5 MG/ML IJ SOLN: 10.0000 mg | Freq: Once | INTRAMUSCULAR | Status: DC | PRN

## 2023-11-16 MED ORDER — LIDOCAINE HCL (CARDIAC) PF 100 MG/5ML IV SOSY
PREFILLED_SYRINGE | INTRAVENOUS | Status: DC | PRN
Start: 2023-11-16 — End: 2023-11-16
  Administered 2023-11-16: 100 mg via INTRATRACHEAL

## 2023-11-16 MED ORDER — PROPOFOL 1000 MG/100ML IV EMUL
INTRAVENOUS | Status: AC
  Filled 2023-11-16: qty 100

## 2023-11-16 MED ORDER — PROPOFOL 10 MG/ML IV BOLUS
INTRAVENOUS | Status: DC | PRN
  Administered 2023-11-16: 30 mg via INTRAVENOUS
  Administered 2023-11-16: 80 mg via INTRAVENOUS

## 2023-11-16 MED ORDER — PROPOFOL 500 MG/50ML IV EMUL
INTRAVENOUS | Status: DC | PRN
  Administered 2023-11-16: 150 ug/kg/min via INTRAVENOUS

## 2023-11-16 NOTE — Op Note (Signed)
[facility] Patient Name: Jody Stokes Procedure Date: 11/16/2023 7:16 AM MRN: [id number] Date of Birth: 07-Aug-1961 Attending MD: Sanjuan Dame , MD, [id number] CSN: [id number] Age: 63 Admit Type: Outpatient Procedure:                Upper GI endoscopy Indications:              Epigastric abdominal pain, Diarrhea Providers:                Sanjuan Dame, MD, Edrick Kins, RN, Francoise Ceo                            RN, RN, Elinor Parkinson Referring MD:              Medicines:                Monitored Anesthesia Care Complications:            No immediate complications. Estimated Blood Loss:     Estimated blood loss: none. Procedure:                Pre-Anesthesia Assessment:                           - Prior to the procedure, a History and Physical                            was performed, and patient medications and                            allergies were reviewed. The patient's tolerance of                            previous anesthesia was also reviewed. The risks                            and benefits of the procedure and the sedation                            options and risks were discussed with the patient.                            All questions were answered, and informed consent                            was obtained. Prior Anticoagulants: The patient has                            taken no anticoagulant or antiplatelet agents. ASA                            Grade Assessment: III - A patient with severe                            systemic disease. After reviewing the risks and  benefits, the patient was deemed in satisfactory                            condition to undergo the procedure.                           After obtaining informed consent, the endoscope was                            passed under direct vision. Throughout the                            procedure, the patient's blood pressure, pulse, and                             oxygen saturations were monitored continuously. The                            [id number]-[id number] ([id number]) scope was introduced through the                            mouth, and advanced to the afferent and efferent                            jejunal loops. The upper GI endoscopy was                            accomplished without difficulty. The patient                            tolerated the procedure well. Scope In: 7:37:25 AM Scope Out: 7:43:26 AM Total Procedure Duration: 0 hours 6 minutes 1 second  Findings:      The examined esophagus was normal.      Evidence of a gastric bypass was found. A gastric pouch with a small       size was found. The staple line appeared intact. The gastrojejunal       anastomosis was characterized by healthy appearing mucosa. This was       traversed. The pouch-to-jejunum limb measured 30 cm from the anastomosis       and was characterized by healthy appearing mucosa. The jejunojejunal       anastomosis was characterized by healthy appearing mucosa. The       duodenum-to-jejunum limb was examined. The excluded stomach was examined       and was characterized by healthy appearing mucosa. Biopsies were taken       with a cold forceps for histology. Impression:               - Normal esophagus.                           - Gastric bypass with a small-sized pouch and                            intact staple line. Gastrojejunal anastomosis  characterized by healthy appearing mucosa. Biopsied                            gastric pouch and small bowel to r/o enteropathy . Moderate Sedation:      Per Anesthesia Care Recommendation:           - Patient has a contact number available for                            emergencies. The signs and symptoms of potential                            delayed complications were discussed with the                            patient. Return to normal activities tomorrow.                            Written  discharge instructions were provided to the                            patient.                           - Resume previous diet.                           - Continue present medications.                           - Await pathology results.                           -six small meals a day Procedure Code(s):        --- Professional ---                           [phone], Esophagogastroduodenoscopy, flexible,                            transoral; with biopsy, single or multiple Diagnosis Code(s):        --- Professional ---                           X91.47, Bariatric surgery status                           R10.13, Epigastric pain                           R19.7, Diarrhea, unspecified CPT copyright 2022 American Medical Association. All rights reserved. The codes documented in this report are preliminary and upon coder review may  be revised to meet current compliance requirements. Sanjuan Dame, MD Sanjuan Dame, MD 11/16/2023 8:17:58 AM This report has been signed electronically. Number of Addenda: 0

## 2023-11-16 NOTE — Discharge Instructions (Signed)

## 2023-11-16 NOTE — Anesthesia Preprocedure Evaluation (Signed)
Anesthesia Evaluation  Patient identified by MRN, date of birth, ID band Patient awake    Reviewed: Allergy & Precautions, H&P , NPO status , Patient's Chart, lab work & pertinent test results  Airway Mallampati: II  TM Distance: >3 FB Neck ROM: Full    Dental no notable dental hx.    Pulmonary neg pulmonary ROS, sleep apnea    Pulmonary exam normal breath sounds clear to auscultation       Cardiovascular Exercise Tolerance: Good hypertension, negative cardio ROS Normal cardiovascular exam Rhythm:Regular Rate:Normal     Neuro/Psych  PSYCHIATRIC DISORDERS  Depression     Neuromuscular disease negative neurological ROS  negative psych ROS   GI/Hepatic negative GI ROS, Neg liver ROS,,,  Endo/Other  negative endocrine ROSdiabetes    Renal/GU negative Renal ROS  negative genitourinary   Musculoskeletal negative musculoskeletal ROS (+) Arthritis ,  Fibromyalgia -  Abdominal  (+) + obese  Peds negative pediatric ROS (+)  Hematology negative hematology ROS (+) Blood dyscrasia, anemia   Anesthesia Other Findings   Reproductive/Obstetrics negative OB ROS                             Anesthesia Physical Anesthesia Plan  ASA: 2  Anesthesia Plan: General   Post-op Pain Management: Minimal or no pain anticipated   Induction: Intravenous  PONV Risk Score and Plan: 1 and Propofol infusion  Airway Management Planned: Simple Face Mask and Nasal Cannula  Additional Equipment:   Intra-op Plan:   Post-operative Plan:   Informed Consent: I have reviewed the patients History and Physical, chart, labs and discussed the procedure including the risks, benefits and alternatives for the proposed anesthesia with the patient or authorized representative who has indicated his/her understanding and acceptance.       Plan Discussed with: CRNA  Anesthesia Plan Comments:        Anesthesia Quick  Evaluation

## 2023-11-16 NOTE — Op Note (Signed)
[facility] Patient Name: Jody Stokes Procedure Date: 11/16/2023 7:14 AM MRN: [id number] Date of Birth: 02-Jul-1961 Attending MD: Sanjuan Dame , MD, [id number] CSN: [id number] Age: 63 Admit Type: Outpatient Procedure:                Colonoscopy Indications:              High risk colon cancer surveillance: Personal                            history of colonic polyps Providers:                Sanjuan Dame, MD, Edrick Kins, RN, Francoise Ceo                            RN, RN, Elinor Parkinson Referring MD:              Medicines:                Monitored Anesthesia Care Complications:            No immediate complications. Estimated Blood Loss:     Estimated blood loss was minimal. Procedure:                Pre-Anesthesia Assessment:                           - Prior to the procedure, a History and Physical                            was performed, and patient medications and                            allergies were reviewed. The patient's tolerance of                            previous anesthesia was also reviewed. The risks                            and benefits of the procedure and the sedation                            options and risks were discussed with the patient.                            All questions were answered, and informed consent                            was obtained. Prior Anticoagulants: The patient has                            taken no anticoagulant or antiplatelet agents. ASA                            Grade Assessment: III - A patient with severe  systemic disease. After reviewing the risks and                            benefits, the patient was deemed in satisfactory                            condition to undergo the procedure.                           After obtaining informed consent, the colonoscope                            was passed under direct vision. Throughout the                            procedure, the  patient's blood pressure, pulse, and                            oxygen saturations were monitored continuously. The                            [id number] scope was introduced through the                            anus and advanced to the the terminal ileum. The                            colonoscopy was performed without difficulty. The                            patient tolerated the procedure well. The quality                            of the bowel preparation was evaluated using the                            BBPS [facility] Bowel Preparation Scale) with scores                            of: Right Colon = 2 (minor amount of residual                            staining, small fragments of stool and/or opaque                            liquid, but mucosa seen well), Transverse Colon = 2                            (minor amount of residual staining, small fragments                            of stool and/or opaque liquid, but mucosa seen  well) and Left Colon = 2 (minor amount of residual                            staining, small fragments of stool and/or opaque                            liquid, but mucosa seen well). The total BBPS score                            equals 6. The terminal ileum, ileocecal valve,                            appendiceal orifice, and rectum were photographed. Scope In: 7:47:40 AM Scope Out: 8:07:50 AM Scope Withdrawal Time: 0 hours 18 minutes 19 seconds  Total Procedure Duration: 0 hours 20 minutes 10 seconds  Findings:      Two sessile polyps were found in the transverse colon and cecum. The       polyps were 5 to 8 mm in size. These polyps were removed with a cold       snare. Resection and retrieval were complete.      There is no endoscopic evidence of inflammation in the entire colon.       Biopsies for histology were taken with a cold forceps for evaluation of       microscopic colitis.      A few diverticula were  found in the left colon.      Non-bleeding external and internal hemorrhoids were found during       retroflexion. The hemorrhoids were small.      The terminal ileum appeared normal. Impression:               - Two 5 to 8 mm polyps in the transverse colon and                            in the cecum, removed with a cold snare. Resected                            and retrieved.                           - Diverticulosis in the left colon.                           - Non-bleeding external and internal hemorrhoids.                           - The examined portion of the ileum was normal. Moderate Sedation:      Per Anesthesia Care Recommendation:           - Patient has a contact number available for                            emergencies. The signs and symptoms of potential                            delayed complications  were discussed with the                            patient. Return to normal activities tomorrow.                            Written discharge instructions were provided to the                            patient.                           - Resume previous diet.                           - Continue present medications.                           - Await pathology results.                           - Repeat colonoscopy in 5 years for surveillance.                           - Return to GI office at appointment to be                            scheduled.                           -Levsin                           -Colestyramine                           -GI symptoms could be due to GLP-1 agonist but                            continue until patient can tolerate it Procedure Code(s):        --- Professional ---                           [phone], Colonoscopy, flexible; with removal of                            tumor(s), polyp(s), or other lesion(s) by snare                            technique                           45380, 59, Colonoscopy, flexible; with biopsy,                             single or multiple Diagnosis Code(s):        --- Professional ---  Z86.010, Personal history of colonic polyps                           D12.3, Benign neoplasm of transverse colon (hepatic                            flexure or splenic flexure)                           D12.0, Benign neoplasm of cecum                           K64.8, Other hemorrhoids                           K57.30, Diverticulosis of large intestine without                            perforation or abscess without bleeding CPT copyright 2022 American [employer] Association. All rights reserved. The codes documented in this report are preliminary and upon coder review may  be revised to meet current compliance requirements. Sanjuan Dame, MD Sanjuan Dame, MD 11/16/2023 8:21:46 AM This report has been signed electronically. Number of Addenda: 0

## 2023-11-16 NOTE — Interval H&P Note (Signed)
History and Physical Interval Note:  11/16/2023 7:24 AM  Jody Stokes  has presented today for surgery, with the diagnosis of GERD,ABDOMINAL PAIN, SCREENING.  The various methods of treatment have been discussed with the patient and family. After consideration of risks, benefits and other options for treatment, the patient has consented to  Procedure(s) with comments: COLONOSCOPY WITH PROPOFOL (N/A) - 7:30AM;ASA 1-2 ESOPHAGOGASTRODUODENOSCOPY (EGD) WITH PROPOFOL (N/A) - 7:30AM;ASA 1-2 as a surgical intervention.  The patient's history has been reviewed, patient examined, no change in status, stable for surgery.  I have reviewed the patient's chart and labs.  Questions were answered to the patient's satisfaction.     Juanetta Beets Jenniferlynn Saad

## 2023-11-16 NOTE — Anesthesia Postprocedure Evaluation (Signed)
Anesthesia Post Note  Patient: Jody Stokes  Procedure(s) Performed: COLONOSCOPY WITH PROPOFOL ESOPHAGOGASTRODUODENOSCOPY (EGD) WITH PROPOFOL POLYPECTOMY BIOPSY  Patient location during evaluation: PACU Anesthesia Type: General Level of consciousness: awake and alert Pain management: pain level controlled Vital Signs Assessment: post-procedure vital signs reviewed and stable Respiratory status: spontaneous breathing, nonlabored ventilation and respiratory function stable Cardiovascular status: blood pressure returned to baseline and stable Postop Assessment: no apparent nausea or vomiting Anesthetic complications: no   No notable events documented.   Last Vitals:  Vitals:   11/16/23 0656 11/16/23 0814  BP: (!) 134/90 101/61  Pulse: 82 85  Resp: 16 15  Temp: 36.9 C 36.4 C  SpO2: 99% 98%    Last Pain:  Vitals:   11/16/23 0814  TempSrc: Oral  PainSc: 0-No pain                 Roslynn Amble

## 2023-11-16 NOTE — Transfer of Care (Addendum)
Immediate Anesthesia Transfer of Care Note  Patient: Jody Stokes  Procedure(s) Performed: COLONOSCOPY WITH PROPOFOL ESOPHAGOGASTRODUODENOSCOPY (EGD) WITH PROPOFOL POLYPECTOMY BIOPSY  Patient Location: Short Stay  Anesthesia Type:General  Level of Consciousness: drowsy and patient cooperative  Airway & Oxygen Therapy: Patient Spontanous Breathing  Post-op Assessment: Report given to RN and Post -op Vital signs reviewed and stable  Post vital signs: Reviewed and stable  Last Vitals:  Vitals Value Taken Time  BP 101/61 11/16/23   0814  Temp 36.4 11/16/23   0814  Pulse 85 11/16/23   0814  Resp 15 11/16/23   0814  SpO2 98% 11/16/23   0814    Last Pain:  Vitals:   11/16/23 0731  TempSrc:   PainSc: 0-No pain         Complications: No notable events documented.

## 2023-11-19 ENCOUNTER — Encounter (HOSPITAL_COMMUNITY): Payer: Self-pay | Admitting: Gastroenterology

## 2023-11-19 ENCOUNTER — Encounter (INDEPENDENT_AMBULATORY_CARE_PROVIDER_SITE_OTHER): Payer: Self-pay | Admitting: *Deleted

## 2023-11-19 LAB — SURGICAL PATHOLOGY

## 2023-11-20 NOTE — Progress Notes (Signed)
I reviewed the pathology results. Ann, can you send her a letter with the findings as described below please? Repeat colonoscopy in 5 years  Thanks,  [name], MD Gastroenterology and Hepatology [facility]  ---------------------------------------------------------------------------------------------  [facility] [address], [address], [place], [place] [address] Phone:  [phone]   11/20/23 [name], [place]   Dear Jody Stokes,  I am writing to let you know the results of your recent colonoscopy.  You had a total of 2 polyps removed. The pathology came back as "tubular adenoma." These findings are NOT cancer, but had the polyps remained in your colon, they could have turned into cancer.  Given these findings, it is recommended that your next colonoscopy be performed in 5 years.  Also biopsies of the stomach pouch and small bowel biopsies did not demonstrate anything concerning  Please continue to follow-up in the GI clinic given your symptoms  Also I value your feedback , so if you get a survey , please take the time to fill it out and thank you for choosing [facility]  Please call us at [phone] if you have persistent problems or have questions about your condition that have not been fully answered at this time.  Sincerely,  [name], MD Gastroenterology and Hepatology

## 2023-11-21 ENCOUNTER — Encounter (INDEPENDENT_AMBULATORY_CARE_PROVIDER_SITE_OTHER): Payer: Self-pay | Admitting: *Deleted

## 2023-12-19 ENCOUNTER — Encounter (INDEPENDENT_AMBULATORY_CARE_PROVIDER_SITE_OTHER): Payer: Self-pay

## 2023-12-20 ENCOUNTER — Ambulatory Visit (INDEPENDENT_AMBULATORY_CARE_PROVIDER_SITE_OTHER): Payer: BC Managed Care – PPO | Admitting: Gastroenterology

## 2024-01-16 ENCOUNTER — Encounter (INDEPENDENT_AMBULATORY_CARE_PROVIDER_SITE_OTHER): Payer: Self-pay

## 2024-01-16 ENCOUNTER — Ambulatory Visit (INDEPENDENT_AMBULATORY_CARE_PROVIDER_SITE_OTHER): Admitting: Gastroenterology

## 2024-05-07 ENCOUNTER — Other Ambulatory Visit: Payer: Self-pay

## 2024-05-07 MED ORDER — METOPROLOL SUCCINATE ER 25 MG PO TB24: 25.0000 mg | ORAL_TABLET | Freq: Every day | ORAL | 0 refills | Status: DC

## 2024-05-12 ENCOUNTER — Other Ambulatory Visit: Payer: Self-pay | Admitting: *Deleted

## 2024-05-14 ENCOUNTER — Other Ambulatory Visit: Payer: Self-pay | Admitting: Cardiology

## 2024-05-30 ENCOUNTER — Other Ambulatory Visit (HOSPITAL_COMMUNITY): Payer: Self-pay | Admitting: Family Medicine

## 2024-05-30 DIAGNOSIS — R519 Headache, unspecified: Secondary | ICD-10-CM

## 2024-06-05 ENCOUNTER — Ambulatory Visit (HOSPITAL_COMMUNITY)
Admission: RE | Admit: 2024-06-05 | Discharge: 2024-06-05 | Disposition: A | Discharge status: Home / Self Care | Source: Ambulatory Visit | Attending: Family Medicine | Admitting: Family Medicine

## 2024-06-05 DIAGNOSIS — R519 Headache, unspecified: Secondary | ICD-10-CM | POA: Diagnosis present

## 2024-10-09 ENCOUNTER — Other Ambulatory Visit (HOSPITAL_COMMUNITY): Payer: Self-pay

## 2024-10-09 DIAGNOSIS — Z1231 Encounter for screening mammogram for malignant neoplasm of breast: Secondary | ICD-10-CM
